# Patient Record
Sex: Male | Born: 1955 | Race: White | Hispanic: No | Smoking: Former smoker
Health system: Southern US, Community
[De-identification: ages and names within clinical notes are randomized; demographics above are authoritative.]

## PROBLEM LIST (undated history)

## (undated) DIAGNOSIS — I10 Essential (primary) hypertension: Secondary | ICD-10-CM

## (undated) DIAGNOSIS — N400 Enlarged prostate without lower urinary tract symptoms: Secondary | ICD-10-CM

## (undated) DIAGNOSIS — E119 Type 2 diabetes mellitus without complications: Secondary | ICD-10-CM

## (undated) DIAGNOSIS — E785 Hyperlipidemia, unspecified: Secondary | ICD-10-CM

## (undated) DIAGNOSIS — J449 Chronic obstructive pulmonary disease, unspecified: Secondary | ICD-10-CM

## (undated) HISTORY — PX: CYST REMOVAL TRUNK: SHX6283

## (undated) HISTORY — PX: NISSEN FUNDOPLICATION: SHX2091

## (undated) HISTORY — PX: NECK SURGERY: SHX720

## (undated) HISTORY — DX: Benign prostatic hyperplasia without lower urinary tract symptoms: N40.0

---

## 1999-01-24 HISTORY — PX: UMBILICAL HERNIA REPAIR: SHX196

## 2004-05-02 ENCOUNTER — Ambulatory Visit: Payer: Self-pay

## 2005-06-23 ENCOUNTER — Ambulatory Visit: Payer: Self-pay | Admitting: General Surgery

## 2005-07-18 ENCOUNTER — Ambulatory Visit: Payer: Self-pay | Admitting: Otolaryngology

## 2005-11-02 ENCOUNTER — Ambulatory Visit: Payer: Self-pay | Admitting: Gastroenterology

## 2005-11-08 ENCOUNTER — Ambulatory Visit: Payer: Self-pay | Admitting: Gastroenterology

## 2005-12-07 ENCOUNTER — Ambulatory Visit: Payer: Self-pay | Admitting: Gastroenterology

## 2006-02-13 ENCOUNTER — Other Ambulatory Visit: Payer: Self-pay

## 2006-02-13 ENCOUNTER — Inpatient Hospital Stay: Payer: Self-pay | Admitting: Internal Medicine

## 2006-02-21 ENCOUNTER — Ambulatory Visit: Payer: Self-pay | Admitting: Internal Medicine

## 2006-07-11 ENCOUNTER — Ambulatory Visit: Payer: Self-pay | Admitting: Specialist

## 2006-08-01 ENCOUNTER — Ambulatory Visit: Payer: Self-pay | Admitting: Gastroenterology

## 2006-08-01 ENCOUNTER — Ambulatory Visit: Payer: Self-pay | Admitting: Specialist

## 2006-08-02 ENCOUNTER — Emergency Department: Payer: Self-pay | Admitting: Emergency Medicine

## 2006-09-06 ENCOUNTER — Other Ambulatory Visit: Payer: Self-pay

## 2006-09-06 ENCOUNTER — Inpatient Hospital Stay: Payer: Self-pay | Admitting: Internal Medicine

## 2006-09-14 ENCOUNTER — Ambulatory Visit: Payer: Self-pay | Admitting: Internal Medicine

## 2006-12-13 ENCOUNTER — Ambulatory Visit: Payer: Self-pay | Admitting: Internal Medicine

## 2008-09-29 ENCOUNTER — Ambulatory Visit: Payer: Self-pay | Admitting: Internal Medicine

## 2008-10-21 ENCOUNTER — Ambulatory Visit: Payer: Self-pay | Admitting: Internal Medicine

## 2009-09-03 ENCOUNTER — Ambulatory Visit: Payer: Self-pay | Admitting: Internal Medicine

## 2010-01-14 ENCOUNTER — Ambulatory Visit: Payer: Self-pay

## 2010-07-26 ENCOUNTER — Encounter: Payer: Self-pay | Admitting: Physician Assistant

## 2010-08-23 ENCOUNTER — Ambulatory Visit: Payer: Self-pay | Admitting: Unknown Physician Specialty

## 2014-09-17 DIAGNOSIS — K635 Polyp of colon: Secondary | ICD-10-CM | POA: Insufficient documentation

## 2014-09-17 DIAGNOSIS — K219 Gastro-esophageal reflux disease without esophagitis: Secondary | ICD-10-CM | POA: Insufficient documentation

## 2014-09-17 DIAGNOSIS — E119 Type 2 diabetes mellitus without complications: Secondary | ICD-10-CM | POA: Insufficient documentation

## 2014-10-06 ENCOUNTER — Other Ambulatory Visit: Payer: Self-pay | Admitting: Nurse Practitioner

## 2014-10-06 DIAGNOSIS — R10819 Abdominal tenderness, unspecified site: Secondary | ICD-10-CM

## 2014-10-08 ENCOUNTER — Ambulatory Visit
Admission: RE | Admit: 2014-10-08 | Discharge: 2014-10-08 | Disposition: A | Discharge status: Home / Self Care | Payer: BLUE CROSS/BLUE SHIELD | Source: Ambulatory Visit | Attending: Nurse Practitioner | Admitting: Nurse Practitioner

## 2014-10-08 DIAGNOSIS — I700 Atherosclerosis of aorta: Secondary | ICD-10-CM | POA: Diagnosis not present

## 2014-10-08 DIAGNOSIS — R10819 Abdominal tenderness, unspecified site: Secondary | ICD-10-CM | POA: Diagnosis present

## 2014-10-08 DIAGNOSIS — K4090 Unilateral inguinal hernia, without obstruction or gangrene, not specified as recurrent: Secondary | ICD-10-CM | POA: Diagnosis not present

## 2014-10-08 HISTORY — DX: Type 2 diabetes mellitus without complications: E11.9

## 2014-10-08 MED ORDER — IOHEXOL 300 MG/ML  SOLN
100.0000 mL | Freq: Once | INTRAMUSCULAR | Status: AC | PRN
  Administered 2014-10-08: 100 mL via INTRAVENOUS

## 2014-10-09 LAB — POCT I-STAT CREATININE: CREATININE: 0.9 mg/dL (ref 0.61–1.24)

## 2014-10-23 ENCOUNTER — Encounter: Payer: Self-pay | Admitting: *Deleted

## 2014-10-26 ENCOUNTER — Ambulatory Visit: Payer: BLUE CROSS/BLUE SHIELD | Admitting: Anesthesiology

## 2014-10-26 ENCOUNTER — Encounter: Payer: Self-pay | Admitting: *Deleted

## 2014-10-26 ENCOUNTER — Encounter
Admission: RE | Disposition: A | Discharge status: Home / Self Care | Payer: Self-pay | Source: Ambulatory Visit | Attending: Gastroenterology

## 2014-10-26 ENCOUNTER — Ambulatory Visit
Admission: RE | Admit: 2014-10-26 | Discharge: 2014-10-26 | Disposition: A | Discharge status: Home / Self Care | Payer: BLUE CROSS/BLUE SHIELD | Source: Ambulatory Visit | Attending: Gastroenterology | Admitting: Gastroenterology

## 2014-10-26 DIAGNOSIS — Z79899 Other long term (current) drug therapy: Secondary | ICD-10-CM | POA: Diagnosis not present

## 2014-10-26 DIAGNOSIS — Z87891 Personal history of nicotine dependence: Secondary | ICD-10-CM | POA: Diagnosis not present

## 2014-10-26 DIAGNOSIS — K2950 Unspecified chronic gastritis without bleeding: Secondary | ICD-10-CM | POA: Insufficient documentation

## 2014-10-26 DIAGNOSIS — Z7984 Long term (current) use of oral hypoglycemic drugs: Secondary | ICD-10-CM | POA: Insufficient documentation

## 2014-10-26 DIAGNOSIS — J449 Chronic obstructive pulmonary disease, unspecified: Secondary | ICD-10-CM | POA: Diagnosis not present

## 2014-10-26 DIAGNOSIS — E119 Type 2 diabetes mellitus without complications: Secondary | ICD-10-CM | POA: Diagnosis not present

## 2014-10-26 DIAGNOSIS — D124 Benign neoplasm of descending colon: Secondary | ICD-10-CM | POA: Diagnosis not present

## 2014-10-26 DIAGNOSIS — R1013 Epigastric pain: Secondary | ICD-10-CM | POA: Diagnosis present

## 2014-10-26 DIAGNOSIS — Z7951 Long term (current) use of inhaled steroids: Secondary | ICD-10-CM | POA: Diagnosis not present

## 2014-10-26 DIAGNOSIS — K621 Rectal polyp: Secondary | ICD-10-CM | POA: Insufficient documentation

## 2014-10-26 DIAGNOSIS — K5730 Diverticulosis of large intestine without perforation or abscess without bleeding: Secondary | ICD-10-CM | POA: Insufficient documentation

## 2014-10-26 DIAGNOSIS — K319 Disease of stomach and duodenum, unspecified: Secondary | ICD-10-CM | POA: Insufficient documentation

## 2014-10-26 DIAGNOSIS — D125 Benign neoplasm of sigmoid colon: Secondary | ICD-10-CM | POA: Insufficient documentation

## 2014-10-26 HISTORY — PX: COLONOSCOPY WITH PROPOFOL: SHX5780

## 2014-10-26 HISTORY — DX: Chronic obstructive pulmonary disease, unspecified: J44.9

## 2014-10-26 HISTORY — PX: ESOPHAGOGASTRODUODENOSCOPY (EGD) WITH PROPOFOL: SHX5813

## 2014-10-26 LAB — GLUCOSE, CAPILLARY: Glucose-Capillary: 102 mg/dL — ABNORMAL HIGH (ref 65–99)

## 2014-10-26 SURGERY — COLONOSCOPY WITH PROPOFOL
Anesthesia: General

## 2014-10-26 MED ORDER — SODIUM CHLORIDE 0.9 % IV SOLN
INTRAVENOUS | Status: DC
  Administered 2014-10-26: 1000 mL via INTRAVENOUS

## 2014-10-26 MED ORDER — PROPOFOL 500 MG/50ML IV EMUL
INTRAVENOUS | Status: DC | PRN
  Administered 2014-10-26: 160 ug/kg/min via INTRAVENOUS

## 2014-10-26 MED ORDER — PROPOFOL 10 MG/ML IV BOLUS
INTRAVENOUS | Status: DC | PRN
  Administered 2014-10-26: 40 mg via INTRAVENOUS
  Administered 2014-10-26: 60 mg via INTRAVENOUS

## 2014-10-26 MED ORDER — GLYCOPYRROLATE 0.2 MG/ML IJ SOLN
INTRAMUSCULAR | Status: DC | PRN
  Administered 2014-10-26: 0.3 mg via INTRAVENOUS

## 2014-10-26 MED ORDER — LIDOCAINE HCL (CARDIAC) 20 MG/ML IV SOLN
INTRAVENOUS | Status: DC | PRN
  Administered 2014-10-26: 100 mg via INTRAVENOUS

## 2014-10-26 MED ORDER — FENTANYL CITRATE (PF) 100 MCG/2ML IJ SOLN
INTRAMUSCULAR | Status: DC | PRN
  Administered 2014-10-26 (×2): 25 ug via INTRAVENOUS

## 2014-10-26 MED ORDER — PHENYLEPHRINE HCL 10 MG/ML IJ SOLN
INTRAMUSCULAR | Status: DC | PRN
  Administered 2014-10-26 (×2): 100 ug via INTRAVENOUS

## 2014-10-26 MED ORDER — SODIUM CHLORIDE 0.9 % IV SOLN: INTRAVENOUS | Status: DC

## 2014-10-26 NOTE — Anesthesia Postprocedure Evaluation (Signed)
  Anesthesia Post-op Note  Patient: Ricardo Durham  Procedure(s) Performed: Procedure(s): COLONOSCOPY WITH PROPOFOL (N/A) ESOPHAGOGASTRODUODENOSCOPY (EGD) WITH PROPOFOL (N/A)  Anesthesia type:General  Patient location: PACU  Post pain: Pain level controlled  Post assessment: Post-op Vital signs reviewed, Patient's Cardiovascular Status Stable, Respiratory Function Stable, Patent Airway and No signs of Nausea or vomiting  Post vital signs: Reviewed and stable  Last Vitals:  Filed Vitals:   10/26/14 1510  BP: 92/50  Pulse: 71  Temp:   Resp: 15    Level of consciousness: awake, alert  and patient cooperative  Complications: No apparent anesthesia complications

## 2014-10-26 NOTE — Anesthesia Preprocedure Evaluation (Signed)
Anesthesia Evaluation  Patient identified by MRN, date of birth, ID band Patient awake    Reviewed: Allergy & Precautions, H&P , NPO status , Patient's Chart, lab work & pertinent test results, reviewed documented beta blocker date and time   Airway Mallampati: II  TM Distance: >3 FB Neck ROM: full    Dental no notable dental hx.    Pulmonary neg pulmonary ROS, COPD, former smoker,    Pulmonary exam normal breath sounds clear to auscultation       Cardiovascular Exercise Tolerance: Good negative cardio ROS   Rhythm:regular Rate:Normal     Neuro/Psych negative neurological ROS  negative psych ROS   GI/Hepatic negative GI ROS, Neg liver ROS,   Endo/Other  negative endocrine ROSdiabetes  Renal/GU negative Renal ROS  negative genitourinary   Musculoskeletal   Abdominal   Peds  Hematology negative hematology ROS (+)   Anesthesia Other Findings   Reproductive/Obstetrics negative OB ROS                             Anesthesia Physical Anesthesia Plan  ASA: III  Anesthesia Plan: General   Post-op Pain Management:    Induction:   Airway Management Planned:   Additional Equipment:   Intra-op Plan:   Post-operative Plan:   Informed Consent: I have reviewed the patients History and Physical, chart, labs and discussed the procedure including the risks, benefits and alternatives for the proposed anesthesia with the patient or authorized representative who has indicated his/her understanding and acceptance.   Dental Advisory Given  Plan Discussed with: CRNA  Anesthesia Plan Comments:         Anesthesia Quick Evaluation

## 2014-10-26 NOTE — Transfer of Care (Signed)
Immediate Anesthesia Transfer of Care Note  Patient: Ricardo Durham  Procedure(s) Performed: Procedure(s): COLONOSCOPY WITH PROPOFOL (N/A) ESOPHAGOGASTRODUODENOSCOPY (EGD) WITH PROPOFOL (N/A)  Patient Location: PACU  Anesthesia Type:General  Level of Consciousness: awake  Airway & Oxygen Therapy: Patient Spontanous Breathing and Patient connected to nasal cannula oxygen  Post-op Assessment: Report given to RN and Post -op Vital signs reviewed and stable  Post vital signs: Reviewed and stable  Last Vitals:  Filed Vitals:   10/26/14 1504  BP: 90/53  Pulse: 72  Temp: 36.4 C  Resp: 12    Complications: No apparent anesthesia complications

## 2014-10-26 NOTE — H&P (Signed)
Outpatient short stay form Pre-procedure 10/26/2014 2:06 PM Ricardo Sails MD  Primary Physician: Dr. Clayborn Bigness  Reason for visit:  EGD and colonoscopy  History of present illness:  She is a 59 year old male presenting today with complaint of epigastric pain some lower abdominal discomfort after taking coffee. He was taking a proton pump inhibitor intermittently this has been increased in frequency as of last week he also has a personal history of colon polyps last colonoscopy being done in 2005.  She tolerated his prep well. He does not take any aspirin products or anticoagulation medications however he does take diclofenac 75 mg once to twice a day.    Current facility-administered medications:  .  0.9 %  sodium chloride infusion, , Intravenous, Continuous, Ricardo Sails, MD, Last Rate: 20 mL/hr at 10/26/14 1348, 1,000 mL at 10/26/14 1348 .  0.9 %  sodium chloride infusion, , Intravenous, Continuous, Ricardo Sails, MD  Facility-Administered Medications Ordered in Other Encounters:  .  glycopyrrolate (ROBINUL) injection, , Intravenous, Anesthesia Intra-op, Aline Brochure, CRNA, 0.3 mg at 10/26/14 1358  Prescriptions prior to admission  Medication Sig Dispense Refill Last Dose  . Glucose Blood (ACCU-CHEK AVIVA PLUS VI) by In Vitro route.   10/26/2014 at Unknown time  . glyBURIDE (DIABETA) 5 MG tablet Take 5 mg by mouth daily with breakfast.   10/23/2014 at Unknown time  . omeprazole (PRILOSEC) 40 MG capsule Take 40 mg by mouth daily.   10/23/2014  . polyethylene glycol-electrolytes (NULYTELY/GOLYTELY) 420 G solution Take 4,000 mLs by mouth once.   10/25/2014 at Unknown time  . sitaGLIPtin-metformin (JANUMET) 50-1000 MG tablet Take 1 tablet by mouth 2 (two) times daily with a meal.   10/23/2014  . azithromycin (ZITHROMAX) 250 MG tablet Take by mouth daily.   Not Taking at Unknown time  . diclofenac (VOLTAREN) 75 MG EC tablet Take 75 mg by mouth 2 (two) times daily.   Not Taking at  Unknown time  . fluticasone (FLONASE) 50 MCG/ACT nasal spray Place 2 sprays into both nostrils daily.   Not Taking at Unknown time     No Known Allergies   Past Medical History  Diagnosis Date  . Diabetes mellitus without complication ([place])   . COPD (chronic obstructive pulmonary disease) ([facility])     Review of systems:      Physical Exam    Heart and lungs: Regular rate and rhythm without rub or gallop, lungs are bilaterally clear   HEENT: Normocephalic atraumatic eyes are anicteric    Other:     Pertinant exam for procedure: Soft nontender nondistended bowel sounds positive normoactive. There is some mild discomfort right upper quadrant to palpation. There are no masses or rebound. Bowel sounds are positive normoactive.    Planned proceedures: EGD and colonoscopy. I have discussed the risks benefits and complications of procedures to include not limited to bleeding, infection, perforation and the risk of sedation and the patient wishes to proceed.    Ricardo Sails, MD Gastroenterology 10/26/2014  2:06 PM

## 2014-10-26 NOTE — Op Note (Signed)
[facility] Gastroenterology Patient Name: Ricardo Durham Procedure Date: 10/26/2014 1:57 PM MRN: [id number] Account #: [id number] Date of Birth: 09-13-1955 Admit Type: Outpatient Age: 59 Room: [facility] ENDO ROOM 2 Gender: Male Note Status: Finalized Procedure:         Colonoscopy Indications:       Personal history of colonic polyps Providers:         Lollie Sails, MD Referring MD:      Lavera Guise, MD (Referring MD) Medicines:         Monitored Anesthesia Care Complications:     No immediate complications. Procedure:         Pre-Anesthesia Assessment:                    - ASA Grade Assessment: III - A patient with severe                     systemic disease.                    After obtaining informed consent, the colonoscope was                     passed under direct vision. Throughout the procedure, the                     patient's blood pressure, pulse, and oxygen saturations                     were monitored continuously. The Olympus PCF-H180AL                     colonoscope ( S#: [id number] ) was introduced through the                     anus and advanced to the the cecum, identified by                     appendiceal orifice and ileocecal valve. The colonoscopy                     was performed without difficulty. The patient tolerated                     the procedure well. The quality of the bowel preparation                     was good. Findings:      A 2 mm polyp was found in the distal sigmoid colon. The polyp was       sessile. The polyp was removed with a cold biopsy forceps. Resection and       retrieval were complete.      A 2 mm polyp was found in the descending colon. The polyp was sessile.       The polyp was removed with a cold biopsy forceps. Resection and       retrieval were complete.      A 2 mm polyp was found in the sigmoid colon. The polyp was sessile. The       polyp was removed with a cold biopsy forceps. Resection and  retrieval       were complete.      A 1 mm polyp was found in the rectum. The polyp was sessile. The polyp  was removed with a cold biopsy forceps. Resection and retrieval were       complete.      Multiple small-mouthed diverticula were found in the sigmoid colon and       in the descending colon.      The retroflexed view of the distal rectum and anal verge was normal and       showed no anal or rectal abnormalities.      The digital rectal exam was normal. Impression:        - One 2 mm polyp in the distal sigmoid colon. Resected and                     retrieved.                    - One 2 mm polyp in the descending colon. Resected and                     retrieved.                    - One 2 mm polyp in the sigmoid colon. Resected and                     retrieved.                    - One 1 mm polyp in the rectum. Resected and retrieved.                    - Diverticulosis in the sigmoid colon and in the                     descending colon.                    - The distal rectum and anal verge are normal on                     retroflexion view. Recommendation:    - Discharge patient to home. Procedure Code(s): --- Professional ---                    [id number] [id number], Colonoscopy, flexible; with biopsy, single or                     multiple Diagnosis Code(s): --- Professional ---                    211.3, Benign neoplasm of colon                    569.0, Anal and rectal polyp                    V12.72, Personal history of colonic polyps                    562.10, Diverticulosis of colon (without mention of                     hemorrhage) CPT copyright 2014 American Medical Association. All rights reserved. The codes documented in this report are preliminary and upon coder review may  be revised to meet current compliance requirements. Lollie Sails, MD 10/26/2014 3:00:07 PM This report has been signed electronically. Number of Addenda: 0 Note Initiated On: 10/26/2014 1:57  PM Scope Withdrawal  Time: 0 hours 15 minutes 19 seconds  Total Procedure Duration: 0 hours 25 minutes 8 seconds       [facility]

## 2014-10-26 NOTE — Op Note (Signed)
[facility] Gastroenterology Patient Name: Ricardo Durham Procedure Date: 10/26/2014 1:58 PM MRN: [id number] Account #: [id number] Date of Birth: March 12, 1955 Admit Type: Outpatient Age: 59 Room: [facility] ENDO ROOM 2 Gender: Male Note Status: Finalized Procedure:         Upper GI endoscopy Indications:       Epigastric abdominal pain Providers:         Lollie Sails, MD Referring MD:      Lavera Guise, MD (Referring MD) Medicines:         Monitored Anesthesia Care Complications:     No immediate complications. Procedure:         Pre-Anesthesia Assessment:                    - ASA Grade Assessment: III - A patient with severe                     systemic disease.                    After obtaining informed consent, the endoscope was passed                     under direct vision. Throughout the procedure, the                     patient's blood pressure, pulse, and oxygen saturations                     were monitored continuously. The Olympus GIF-160 endoscope                     (S#. [id number]) was introduced through the mouth, and                     advanced to the third part of duodenum. The upper GI                     endoscopy was accomplished without difficulty. The patient                     tolerated the procedure well. Findings:      The Z-line was variable. Biopsies were taken with a cold forceps for       histology.      The exam of the esophagus was otherwise normal.      Localized minimal inflammation characterized by congestion (edema) was       found in the prepyloric region of the stomach. Biopsies were taken with       a cold forceps for Helicobacter pylori testing.      The cardia and gastric fundus were normal on retroflexion.      The examined duodenum was normal. Impression:        - Z-line variable. Biopsied.                    - Gastritis. Biopsied.                    - Normal examined duodenum. Recommendation:    - Use Protonix  (pantoprazole) 40 mg PO daily daily.                    - Await pathology results. Procedure Code(s): --- Professional ---  81594, Esophagogastroduodenoscopy, flexible, transoral;                     with biopsy, single or multiple Diagnosis Code(s): --- Professional ---                    530.89, Other specified disorders of esophagus                    535.50, Unspecified gastritis and gastroduodenitis,                     without mention of hemorrhage                    789.06, Abdominal pain, epigastric CPT copyright 2014 American Medical Association. All rights reserved. The codes documented in this report are preliminary and upon coder review may  be revised to meet current compliance requirements. Lollie Sails, MD 10/26/2014 2:28:28 PM This report has been signed electronically. Number of Addenda: 0 Note Initiated On: 10/26/2014 1:58 PM      [facility]

## 2014-10-27 ENCOUNTER — Encounter: Payer: Self-pay | Admitting: Gastroenterology

## 2014-10-28 LAB — SURGICAL PATHOLOGY

## 2014-10-30 ENCOUNTER — Encounter: Payer: Self-pay | Admitting: General Surgery

## 2014-11-10 ENCOUNTER — Ambulatory Visit (INDEPENDENT_AMBULATORY_CARE_PROVIDER_SITE_OTHER): Payer: BLUE CROSS/BLUE SHIELD | Admitting: General Surgery

## 2014-11-10 ENCOUNTER — Encounter: Payer: Self-pay | Admitting: General Surgery

## 2014-11-10 VITALS — BP 120/70 | HR 76 | Resp 12 | Ht 71.0 in | Wt 222.0 lb

## 2014-11-10 DIAGNOSIS — K4090 Unilateral inguinal hernia, without obstruction or gangrene, not specified as recurrent: Secondary | ICD-10-CM | POA: Diagnosis not present

## 2014-11-10 NOTE — Patient Instructions (Addendum)

## 2014-11-10 NOTE — Progress Notes (Signed)
Patient ID: Ricardo Durham, male   DOB: [birth year], 59 y.o.   MRN: [id number]  Chief Complaint  Patient presents with  . Other    inguinal hernia    HPI Ricardo Durham is a 59 y.o. male here today for a evaluation of a left inguinal hernia. He states he noticed this about two years ago. 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 Patient had a ultrasound done on 09/23/14 and ct scan performed on 10/08/14.   HPI  Past Medical History  Diagnosis Date  . Diabetes mellitus without complication ([place])   . COPD (chronic obstructive pulmonary disease) [facility])     Past Surgical History  Procedure Laterality Date  . Neck surgery      c 6-7 due to stable fracture  . Nissen fundoplication    . Umbilical hernia repair  2001  . Colonoscopy with propofol N/A 10/26/2014    Procedure: COLONOSCOPY WITH PROPOFOL;  Surgeon: Lollie Sails, MD;  Location: [facility] ENDOSCOPY;  Service: Endoscopy;  Laterality: N/A;  . Esophagogastroduodenoscopy (egd) with propofol N/A 10/26/2014    Procedure: ESOPHAGOGASTRODUODENOSCOPY (EGD) WITH PROPOFOL;  Surgeon: Lollie Sails, MD;  Location: [facility] ENDOSCOPY;  Service: Endoscopy;  Laterality: N/A;    History reviewed. No pertinent family history.  Social History Social History  Substance Use Topics  . Smoking status: Former Smoker    Types: Cigarettes  . Smokeless tobacco: Never Used  . Alcohol Use: No    No Known Allergies  Current Outpatient Prescriptions  Medication Sig Dispense Refill  . azithromycin (ZITHROMAX) 250 MG tablet Take by mouth daily.    . diclofenac (VOLTAREN) 75 MG EC tablet Take 75 mg by mouth 2 (two) times daily.    . fluticasone (FLONASE) 50 MCG/ACT nasal spray Place 2 sprays into both nostrils daily.    . Glucose Blood (ACCU-CHEK AVIVA PLUS VI) by In Vitro route.    Marland Kitchen glyBURIDE (DIABETA) 5 MG tablet Take 5 mg by mouth daily with breakfast.    . omeprazole (PRILOSEC) 40 MG capsule Take 40 mg by mouth daily.    .  polyethylene glycol-electrolytes (NULYTELY/GOLYTELY) 420 G solution Take 4,000 mLs by mouth once.    . sitaGLIPtin-metformin (JANUMET) 50-1000 MG tablet Take 1 tablet by mouth 2 (two) times daily with a meal.     No current facility-administered medications for this visit.    Review of Systems Review of Systems  Constitutional: Negative.   Respiratory: Negative.   Cardiovascular: Negative.     Blood pressure 120/70, pulse 76, resp. rate 12, height 5\' 11"  (1.803 m), weight 222 lb (100.699 kg).  Physical Exam Physical Exam  Constitutional: He is oriented to person, place, and time. He appears well-developed and well-nourished.  Eyes: Conjunctivae are normal. No scleral icterus.  Neck: Neck supple.  Cardiovascular: Normal rate, regular rhythm and normal heart sounds.   Pulmonary/Chest: Effort normal and breath sounds normal.  Abdominal: Soft. Normal appearance and bowel sounds are normal. There is no hepatomegaly. There is no tenderness. A hernia is present. Hernia confirmed positive in the left inguinal area ( small left inguinal hernia).  Lymphadenopathy:    He has no cervical adenopathy.  Neurological: He is alert and oriented to person, place, and time.  Skin: Skin is warm.    Data Reviewed   Assessment    Small inguinal hernia     Plan     Hernia precautions and incarceration were discussed with  the patient. If they develop symptoms of an incarcerated hernia, they were encouraged to seek prompt medical attention.  I have recommended repair of the hernia using mesh on an outpatient basis in the near future. The risk of infection was reviewed. The role of prosthetic mesh to minimize the risk of recurrence was reviewed.    Patient will call to schedule his surgery, he had been given surgery paperwork.   PCP:  Leroy Sea G 11/11/2014, 7:41 AM

## 2014-11-11 ENCOUNTER — Encounter: Payer: Self-pay | Admitting: General Surgery

## 2014-11-12 ENCOUNTER — Other Ambulatory Visit: Payer: Self-pay | Admitting: Neurosurgery

## 2014-11-12 DIAGNOSIS — M542 Cervicalgia: Secondary | ICD-10-CM

## 2014-11-19 ENCOUNTER — Ambulatory Visit
Admission: RE | Admit: 2014-11-19 | Discharge: 2014-11-19 | Disposition: A | Discharge status: Home / Self Care | Payer: BLUE CROSS/BLUE SHIELD | Source: Ambulatory Visit | Attending: Neurosurgery | Admitting: Neurosurgery

## 2014-11-19 ENCOUNTER — Other Ambulatory Visit: Payer: Self-pay

## 2014-11-19 DIAGNOSIS — M542 Cervicalgia: Secondary | ICD-10-CM

## 2014-11-19 MED ORDER — IOHEXOL 300 MG/ML  SOLN
10.0000 mL | Freq: Once | INTRAMUSCULAR | Status: DC | PRN
  Administered 2014-11-19: 10 mL via INTRATHECAL

## 2014-11-19 MED ORDER — DIAZEPAM 5 MG PO TABS
10.0000 mg | ORAL_TABLET | Freq: Once | ORAL | Status: AC
  Administered 2014-11-19: 10 mg via ORAL

## 2014-11-19 NOTE — Discharge Instructions (Signed)

## 2014-12-03 DIAGNOSIS — K4020 Bilateral inguinal hernia, without obstruction or gangrene, not specified as recurrent: Secondary | ICD-10-CM | POA: Insufficient documentation

## 2015-03-30 ENCOUNTER — Ambulatory Visit
Admission: RE | Admit: 2015-03-30 | Discharge: 2015-03-30 | Disposition: A | Discharge status: Home / Self Care | Payer: BLUE CROSS/BLUE SHIELD | Source: Ambulatory Visit | Attending: Nurse Practitioner | Admitting: Nurse Practitioner

## 2015-03-30 ENCOUNTER — Other Ambulatory Visit: Payer: Self-pay | Admitting: Nurse Practitioner

## 2015-03-30 DIAGNOSIS — M13851 Other specified arthritis, right hip: Secondary | ICD-10-CM | POA: Insufficient documentation

## 2015-03-30 DIAGNOSIS — M25552 Pain in left hip: Secondary | ICD-10-CM | POA: Insufficient documentation

## 2015-03-30 DIAGNOSIS — M25559 Pain in unspecified hip: Secondary | ICD-10-CM

## 2015-03-30 DIAGNOSIS — M25551 Pain in right hip: Secondary | ICD-10-CM | POA: Diagnosis not present

## 2015-06-24 DIAGNOSIS — R079 Chest pain, unspecified: Secondary | ICD-10-CM | POA: Diagnosis not present

## 2015-06-24 DIAGNOSIS — E1165 Type 2 diabetes mellitus with hyperglycemia: Secondary | ICD-10-CM | POA: Diagnosis not present

## 2015-06-24 DIAGNOSIS — I25118 Atherosclerotic heart disease of native coronary artery with other forms of angina pectoris: Secondary | ICD-10-CM | POA: Diagnosis not present

## 2015-06-25 ENCOUNTER — Other Ambulatory Visit: Payer: Self-pay | Admitting: Physician Assistant

## 2015-06-25 DIAGNOSIS — K219 Gastro-esophageal reflux disease without esophagitis: Principal | ICD-10-CM

## 2015-06-25 DIAGNOSIS — IMO0001 Reserved for inherently not codable concepts without codable children: Secondary | ICD-10-CM

## 2015-06-28 DIAGNOSIS — R079 Chest pain, unspecified: Secondary | ICD-10-CM | POA: Diagnosis not present

## 2015-07-02 ENCOUNTER — Ambulatory Visit
Admission: RE | Admit: 2015-07-02 | Discharge: 2015-07-02 | Disposition: A | Discharge status: Home / Self Care | Payer: BLUE CROSS/BLUE SHIELD | Source: Ambulatory Visit | Attending: Physician Assistant | Admitting: Physician Assistant

## 2015-07-02 DIAGNOSIS — K219 Gastro-esophageal reflux disease without esophagitis: Principal | ICD-10-CM

## 2015-07-02 DIAGNOSIS — K449 Diaphragmatic hernia without obstruction or gangrene: Secondary | ICD-10-CM | POA: Insufficient documentation

## 2015-07-02 DIAGNOSIS — R1030 Lower abdominal pain, unspecified: Secondary | ICD-10-CM | POA: Diagnosis not present

## 2015-07-02 DIAGNOSIS — IMO0001 Reserved for inherently not codable concepts without codable children: Secondary | ICD-10-CM

## 2015-07-14 DIAGNOSIS — R079 Chest pain, unspecified: Secondary | ICD-10-CM | POA: Diagnosis not present

## 2015-07-14 DIAGNOSIS — R0602 Shortness of breath: Secondary | ICD-10-CM | POA: Diagnosis not present

## 2015-07-16 DIAGNOSIS — H60391 Other infective otitis externa, right ear: Secondary | ICD-10-CM | POA: Diagnosis not present

## 2015-07-24 DIAGNOSIS — H60391 Other infective otitis externa, right ear: Secondary | ICD-10-CM | POA: Diagnosis not present

## 2015-07-29 DIAGNOSIS — I25118 Atherosclerotic heart disease of native coronary artery with other forms of angina pectoris: Secondary | ICD-10-CM | POA: Diagnosis not present

## 2015-08-09 DIAGNOSIS — Z01818 Encounter for other preprocedural examination: Secondary | ICD-10-CM | POA: Diagnosis not present

## 2015-08-09 DIAGNOSIS — J019 Acute sinusitis, unspecified: Secondary | ICD-10-CM | POA: Diagnosis not present

## 2015-08-09 DIAGNOSIS — R931 Abnormal findings on diagnostic imaging of heart and coronary circulation: Secondary | ICD-10-CM | POA: Diagnosis not present

## 2015-08-09 DIAGNOSIS — J208 Acute bronchitis due to other specified organisms: Secondary | ICD-10-CM | POA: Diagnosis not present

## 2015-08-09 DIAGNOSIS — B9689 Other specified bacterial agents as the cause of diseases classified elsewhere: Secondary | ICD-10-CM | POA: Diagnosis not present

## 2015-08-09 DIAGNOSIS — E782 Mixed hyperlipidemia: Secondary | ICD-10-CM | POA: Diagnosis not present

## 2015-08-09 DIAGNOSIS — I208 Other forms of angina pectoris: Secondary | ICD-10-CM | POA: Diagnosis not present

## 2015-08-10 DIAGNOSIS — I208 Other forms of angina pectoris: Secondary | ICD-10-CM | POA: Diagnosis present

## 2015-08-24 ENCOUNTER — Encounter
Admission: RE | Disposition: A | Discharge status: Home / Self Care | Payer: Self-pay | Source: Ambulatory Visit | Attending: Internal Medicine

## 2015-08-24 ENCOUNTER — Ambulatory Visit
Admission: RE | Admit: 2015-08-24 | Discharge: 2015-08-24 | Disposition: A | Discharge status: Home / Self Care | Payer: BLUE CROSS/BLUE SHIELD | Source: Ambulatory Visit | Attending: Internal Medicine | Admitting: Internal Medicine

## 2015-08-24 ENCOUNTER — Encounter: Payer: Self-pay | Admitting: *Deleted

## 2015-08-24 DIAGNOSIS — Z885 Allergy status to narcotic agent status: Secondary | ICD-10-CM | POA: Diagnosis not present

## 2015-08-24 DIAGNOSIS — Z79899 Other long term (current) drug therapy: Secondary | ICD-10-CM | POA: Insufficient documentation

## 2015-08-24 DIAGNOSIS — I2089 Other forms of angina pectoris: Secondary | ICD-10-CM | POA: Diagnosis present

## 2015-08-24 DIAGNOSIS — Z823 Family history of stroke: Secondary | ICD-10-CM | POA: Diagnosis not present

## 2015-08-24 DIAGNOSIS — I208 Other forms of angina pectoris: Secondary | ICD-10-CM | POA: Diagnosis present

## 2015-08-24 DIAGNOSIS — Z8371 Family history of colonic polyps: Secondary | ICD-10-CM | POA: Insufficient documentation

## 2015-08-24 DIAGNOSIS — I25119 Atherosclerotic heart disease of native coronary artery with unspecified angina pectoris: Secondary | ICD-10-CM | POA: Diagnosis not present

## 2015-08-24 DIAGNOSIS — E785 Hyperlipidemia, unspecified: Secondary | ICD-10-CM | POA: Diagnosis not present

## 2015-08-24 DIAGNOSIS — E119 Type 2 diabetes mellitus without complications: Secondary | ICD-10-CM | POA: Diagnosis not present

## 2015-08-24 DIAGNOSIS — Z7984 Long term (current) use of oral hypoglycemic drugs: Secondary | ICD-10-CM | POA: Insufficient documentation

## 2015-08-24 DIAGNOSIS — Z833 Family history of diabetes mellitus: Secondary | ICD-10-CM | POA: Diagnosis not present

## 2015-08-24 DIAGNOSIS — K219 Gastro-esophageal reflux disease without esophagitis: Secondary | ICD-10-CM | POA: Insufficient documentation

## 2015-08-24 DIAGNOSIS — Z87891 Personal history of nicotine dependence: Secondary | ICD-10-CM | POA: Diagnosis not present

## 2015-08-24 DIAGNOSIS — Z86010 Personal history of colonic polyps: Secondary | ICD-10-CM | POA: Diagnosis not present

## 2015-08-24 DIAGNOSIS — I25110 Atherosclerotic heart disease of native coronary artery with unstable angina pectoris: Secondary | ICD-10-CM | POA: Diagnosis not present

## 2015-08-24 DIAGNOSIS — E7800 Pure hypercholesterolemia, unspecified: Secondary | ICD-10-CM | POA: Insufficient documentation

## 2015-08-24 DIAGNOSIS — R079 Chest pain, unspecified: Secondary | ICD-10-CM | POA: Diagnosis present

## 2015-08-24 HISTORY — PX: CARDIAC CATHETERIZATION: SHX172

## 2015-08-24 SURGERY — LEFT HEART CATH AND CORONARY ANGIOGRAPHY
Anesthesia: Moderate Sedation | Laterality: Left

## 2015-08-24 MED ORDER — MIDAZOLAM HCL 2 MG/2ML IJ SOLN
INTRAMUSCULAR | Status: AC
  Filled 2015-08-24: qty 2

## 2015-08-24 MED ORDER — SODIUM CHLORIDE 0.9 % IV SOLN
250.0000 mL | INTRAVENOUS | Status: DC | PRN
  Administered 2015-08-24: 250 mL via INTRAVENOUS

## 2015-08-24 MED ORDER — FENTANYL CITRATE (PF) 100 MCG/2ML IJ SOLN
INTRAMUSCULAR | Status: DC | PRN
  Administered 2015-08-24: 50 ug via INTRAVENOUS

## 2015-08-24 MED ORDER — FENTANYL CITRATE (PF) 100 MCG/2ML IJ SOLN
INTRAMUSCULAR | Status: AC
  Filled 2015-08-24: qty 2

## 2015-08-24 MED ORDER — HEPARIN (PORCINE) IN NACL 2-0.9 UNIT/ML-% IJ SOLN
INTRAMUSCULAR | Status: AC
  Filled 2015-08-24: qty 1000

## 2015-08-24 MED ORDER — MIDAZOLAM HCL 2 MG/2ML IJ SOLN
INTRAMUSCULAR | Status: DC | PRN
  Administered 2015-08-24: 1 mg via INTRAVENOUS

## 2015-08-24 MED ORDER — IOPAMIDOL (ISOVUE-300) INJECTION 61%
INTRAVENOUS | Status: DC | PRN
  Administered 2015-08-24: 130 mL

## 2015-08-24 MED ORDER — ATORVASTATIN CALCIUM 80 MG PO TABS: 80.0000 mg | ORAL_TABLET | Freq: Every day | ORAL | 4 refills | Status: DC

## 2015-08-24 MED ORDER — ONDANSETRON HCL 4 MG/2ML IJ SOLN: 4.0000 mg | Freq: Four times a day (QID) | INTRAMUSCULAR | Status: DC | PRN

## 2015-08-24 MED ORDER — ACETAMINOPHEN 325 MG PO TABS: 650.0000 mg | ORAL_TABLET | ORAL | Status: DC | PRN

## 2015-08-24 SURGICAL SUPPLY — 10 items
CATH INFINITI 5FR ANG PIGTAIL (CATHETERS) ×2 IMPLANT
CATH INFINITI 5FR JL4 (CATHETERS) ×2 IMPLANT
CATH INFINITI JR4 5F (CATHETERS) ×2 IMPLANT
DEVICE CLOSURE MYNXGRIP 5F (Vascular Products) ×2 IMPLANT
KIT MANI 3VAL PERCEP (MISCELLANEOUS) ×2 IMPLANT
NEEDLE PERC 18GX7CM (NEEDLE) ×2 IMPLANT
PACK CARDIAC CATH (CUSTOM PROCEDURE TRAY) ×2 IMPLANT
SHEATH AVANTI 6FR X 11CM (SHEATH) ×2 IMPLANT
SHEATH PINNACLE 5F 10CM (SHEATH) ×2 IMPLANT
WIRE EMERALD 3MM-J .035X150CM (WIRE) ×2 IMPLANT

## 2015-08-24 NOTE — Progress Notes (Signed)
Patient tolerated procedure well. MD has been in to see family. All questions and concerns were addressed. This RN has gone over discharge instructions and new RX information. Family and patient verbalized their understanding. Right groin dressing is dry and intact. No bruising or hematoma noticed. Patient denies pain. Patient is tolerating PO without complication.

## 2015-08-31 DIAGNOSIS — K219 Gastro-esophageal reflux disease without esophagitis: Secondary | ICD-10-CM | POA: Diagnosis not present

## 2015-08-31 DIAGNOSIS — R931 Abnormal findings on diagnostic imaging of heart and coronary circulation: Secondary | ICD-10-CM | POA: Diagnosis not present

## 2015-08-31 DIAGNOSIS — I208 Other forms of angina pectoris: Secondary | ICD-10-CM | POA: Diagnosis not present

## 2015-08-31 DIAGNOSIS — E119 Type 2 diabetes mellitus without complications: Secondary | ICD-10-CM | POA: Diagnosis not present

## 2015-09-28 DIAGNOSIS — I10 Essential (primary) hypertension: Secondary | ICD-10-CM | POA: Diagnosis not present

## 2015-09-28 DIAGNOSIS — I2510 Atherosclerotic heart disease of native coronary artery without angina pectoris: Secondary | ICD-10-CM | POA: Diagnosis not present

## 2015-09-28 DIAGNOSIS — E782 Mixed hyperlipidemia: Secondary | ICD-10-CM | POA: Diagnosis not present

## 2015-09-29 DIAGNOSIS — E782 Mixed hyperlipidemia: Secondary | ICD-10-CM | POA: Diagnosis not present

## 2015-09-29 DIAGNOSIS — Z0001 Encounter for general adult medical examination with abnormal findings: Secondary | ICD-10-CM | POA: Diagnosis not present

## 2015-09-29 DIAGNOSIS — Z125 Encounter for screening for malignant neoplasm of prostate: Secondary | ICD-10-CM | POA: Diagnosis not present

## 2015-09-29 DIAGNOSIS — I25118 Atherosclerotic heart disease of native coronary artery with other forms of angina pectoris: Secondary | ICD-10-CM | POA: Diagnosis not present

## 2015-09-29 DIAGNOSIS — K219 Gastro-esophageal reflux disease without esophagitis: Secondary | ICD-10-CM | POA: Diagnosis not present

## 2015-09-29 DIAGNOSIS — E1165 Type 2 diabetes mellitus with hyperglycemia: Secondary | ICD-10-CM | POA: Diagnosis not present

## 2015-10-05 DIAGNOSIS — R5381 Other malaise: Secondary | ICD-10-CM | POA: Diagnosis not present

## 2015-11-11 DIAGNOSIS — H9313 Tinnitus, bilateral: Secondary | ICD-10-CM | POA: Diagnosis not present

## 2015-11-11 DIAGNOSIS — H903 Sensorineural hearing loss, bilateral: Secondary | ICD-10-CM | POA: Diagnosis not present

## 2015-11-24 DIAGNOSIS — E1165 Type 2 diabetes mellitus with hyperglycemia: Secondary | ICD-10-CM | POA: Diagnosis not present

## 2015-11-24 DIAGNOSIS — K429 Umbilical hernia without obstruction or gangrene: Secondary | ICD-10-CM | POA: Diagnosis not present

## 2015-11-24 DIAGNOSIS — K219 Gastro-esophageal reflux disease without esophagitis: Secondary | ICD-10-CM | POA: Diagnosis not present

## 2015-11-29 ENCOUNTER — Encounter: Payer: Self-pay | Admitting: *Deleted

## 2015-12-01 ENCOUNTER — Ambulatory Visit: Payer: BLUE CROSS/BLUE SHIELD | Admitting: General Surgery

## 2015-12-03 DIAGNOSIS — H2513 Age-related nuclear cataract, bilateral: Secondary | ICD-10-CM | POA: Diagnosis not present

## 2015-12-07 DIAGNOSIS — E1165 Type 2 diabetes mellitus with hyperglycemia: Secondary | ICD-10-CM | POA: Diagnosis not present

## 2015-12-07 DIAGNOSIS — R1030 Lower abdominal pain, unspecified: Secondary | ICD-10-CM | POA: Insufficient documentation

## 2015-12-07 DIAGNOSIS — Z9889 Other specified postprocedural states: Secondary | ICD-10-CM | POA: Diagnosis not present

## 2015-12-07 DIAGNOSIS — Z8719 Personal history of other diseases of the digestive system: Secondary | ICD-10-CM | POA: Diagnosis not present

## 2015-12-07 DIAGNOSIS — K449 Diaphragmatic hernia without obstruction or gangrene: Secondary | ICD-10-CM | POA: Diagnosis not present

## 2015-12-07 DIAGNOSIS — M6208 Separation of muscle (nontraumatic), other site: Secondary | ICD-10-CM | POA: Insufficient documentation

## 2015-12-07 DIAGNOSIS — Z87891 Personal history of nicotine dependence: Secondary | ICD-10-CM | POA: Diagnosis not present

## 2015-12-07 DIAGNOSIS — Z79899 Other long term (current) drug therapy: Secondary | ICD-10-CM | POA: Diagnosis not present

## 2015-12-07 DIAGNOSIS — K2970 Gastritis, unspecified, without bleeding: Secondary | ICD-10-CM | POA: Diagnosis not present

## 2015-12-07 DIAGNOSIS — K219 Gastro-esophageal reflux disease without esophagitis: Secondary | ICD-10-CM | POA: Diagnosis not present

## 2015-12-14 DIAGNOSIS — R1031 Right lower quadrant pain: Secondary | ICD-10-CM | POA: Diagnosis not present

## 2015-12-27 DIAGNOSIS — E1165 Type 2 diabetes mellitus with hyperglycemia: Secondary | ICD-10-CM | POA: Diagnosis not present

## 2015-12-27 DIAGNOSIS — K429 Umbilical hernia without obstruction or gangrene: Secondary | ICD-10-CM | POA: Diagnosis not present

## 2015-12-27 DIAGNOSIS — I10 Essential (primary) hypertension: Secondary | ICD-10-CM | POA: Diagnosis not present

## 2015-12-27 DIAGNOSIS — K219 Gastro-esophageal reflux disease without esophagitis: Secondary | ICD-10-CM | POA: Diagnosis not present

## 2016-01-11 DIAGNOSIS — H6062 Unspecified chronic otitis externa, left ear: Secondary | ICD-10-CM | POA: Diagnosis not present

## 2016-01-11 DIAGNOSIS — H60542 Acute eczematoid otitis externa, left ear: Secondary | ICD-10-CM | POA: Diagnosis not present

## 2016-01-12 DIAGNOSIS — R509 Fever, unspecified: Secondary | ICD-10-CM | POA: Diagnosis not present

## 2016-01-12 DIAGNOSIS — J029 Acute pharyngitis, unspecified: Secondary | ICD-10-CM | POA: Diagnosis not present

## 2016-01-12 DIAGNOSIS — R0981 Nasal congestion: Secondary | ICD-10-CM | POA: Diagnosis not present

## 2016-01-20 ENCOUNTER — Encounter: Payer: Self-pay | Admitting: *Deleted

## 2016-02-02 DIAGNOSIS — E782 Mixed hyperlipidemia: Secondary | ICD-10-CM | POA: Diagnosis not present

## 2016-02-02 DIAGNOSIS — I10 Essential (primary) hypertension: Secondary | ICD-10-CM | POA: Diagnosis not present

## 2016-02-02 DIAGNOSIS — E1159 Type 2 diabetes mellitus with other circulatory complications: Secondary | ICD-10-CM | POA: Diagnosis not present

## 2016-02-02 DIAGNOSIS — I2510 Atherosclerotic heart disease of native coronary artery without angina pectoris: Secondary | ICD-10-CM | POA: Diagnosis not present

## 2016-02-11 DIAGNOSIS — H6060 Unspecified chronic otitis externa, unspecified ear: Secondary | ICD-10-CM | POA: Diagnosis not present

## 2016-02-29 DIAGNOSIS — I10 Essential (primary) hypertension: Secondary | ICD-10-CM | POA: Diagnosis not present

## 2016-02-29 DIAGNOSIS — N50819 Testicular pain, unspecified: Secondary | ICD-10-CM | POA: Diagnosis not present

## 2016-02-29 DIAGNOSIS — K219 Gastro-esophageal reflux disease without esophagitis: Secondary | ICD-10-CM | POA: Diagnosis not present

## 2016-02-29 DIAGNOSIS — E1165 Type 2 diabetes mellitus with hyperglycemia: Secondary | ICD-10-CM | POA: Diagnosis not present

## 2016-03-15 DIAGNOSIS — N50819 Testicular pain, unspecified: Secondary | ICD-10-CM | POA: Diagnosis not present

## 2016-03-22 DIAGNOSIS — R10819 Abdominal tenderness, unspecified site: Secondary | ICD-10-CM | POA: Diagnosis not present

## 2016-03-29 ENCOUNTER — Telehealth: Payer: Self-pay | Admitting: *Deleted

## 2016-04-11 DIAGNOSIS — N50819 Testicular pain, unspecified: Secondary | ICD-10-CM | POA: Diagnosis not present

## 2016-04-11 DIAGNOSIS — I10 Essential (primary) hypertension: Secondary | ICD-10-CM | POA: Diagnosis not present

## 2016-04-11 DIAGNOSIS — E1165 Type 2 diabetes mellitus with hyperglycemia: Secondary | ICD-10-CM | POA: Diagnosis not present

## 2016-04-11 DIAGNOSIS — R10819 Abdominal tenderness, unspecified site: Secondary | ICD-10-CM | POA: Diagnosis not present

## 2016-04-11 NOTE — Telephone Encounter (Signed)
error 

## 2016-06-14 DIAGNOSIS — I10 Essential (primary) hypertension: Secondary | ICD-10-CM | POA: Diagnosis not present

## 2016-06-14 DIAGNOSIS — E1165 Type 2 diabetes mellitus with hyperglycemia: Secondary | ICD-10-CM | POA: Diagnosis not present

## 2016-06-14 DIAGNOSIS — J309 Allergic rhinitis, unspecified: Secondary | ICD-10-CM | POA: Diagnosis not present

## 2016-06-14 DIAGNOSIS — K219 Gastro-esophageal reflux disease without esophagitis: Secondary | ICD-10-CM | POA: Diagnosis not present

## 2016-08-07 DIAGNOSIS — I2510 Atherosclerotic heart disease of native coronary artery without angina pectoris: Secondary | ICD-10-CM | POA: Diagnosis not present

## 2016-08-07 DIAGNOSIS — E782 Mixed hyperlipidemia: Secondary | ICD-10-CM | POA: Diagnosis not present

## 2016-08-07 DIAGNOSIS — I10 Essential (primary) hypertension: Secondary | ICD-10-CM | POA: Diagnosis not present

## 2016-09-05 DIAGNOSIS — I2510 Atherosclerotic heart disease of native coronary artery without angina pectoris: Secondary | ICD-10-CM | POA: Diagnosis not present

## 2016-09-05 DIAGNOSIS — M6281 Muscle weakness (generalized): Secondary | ICD-10-CM | POA: Diagnosis not present

## 2016-09-05 DIAGNOSIS — F0390 Unspecified dementia without behavioral disturbance: Secondary | ICD-10-CM | POA: Diagnosis not present

## 2016-09-07 DIAGNOSIS — E782 Mixed hyperlipidemia: Secondary | ICD-10-CM | POA: Diagnosis not present

## 2016-09-07 DIAGNOSIS — I2510 Atherosclerotic heart disease of native coronary artery without angina pectoris: Secondary | ICD-10-CM | POA: Diagnosis not present

## 2016-09-07 DIAGNOSIS — I10 Essential (primary) hypertension: Secondary | ICD-10-CM | POA: Diagnosis not present

## 2016-09-12 DIAGNOSIS — I10 Essential (primary) hypertension: Secondary | ICD-10-CM | POA: Diagnosis not present

## 2016-09-12 DIAGNOSIS — K219 Gastro-esophageal reflux disease without esophagitis: Secondary | ICD-10-CM | POA: Diagnosis not present

## 2016-09-12 DIAGNOSIS — L723 Sebaceous cyst: Secondary | ICD-10-CM | POA: Diagnosis not present

## 2016-09-12 DIAGNOSIS — E1165 Type 2 diabetes mellitus with hyperglycemia: Secondary | ICD-10-CM | POA: Diagnosis not present

## 2016-10-31 DIAGNOSIS — K219 Gastro-esophageal reflux disease without esophagitis: Secondary | ICD-10-CM | POA: Diagnosis not present

## 2016-10-31 DIAGNOSIS — E1165 Type 2 diabetes mellitus with hyperglycemia: Secondary | ICD-10-CM | POA: Diagnosis not present

## 2016-10-31 DIAGNOSIS — I10 Essential (primary) hypertension: Secondary | ICD-10-CM | POA: Diagnosis not present

## 2016-10-31 DIAGNOSIS — Z0001 Encounter for general adult medical examination with abnormal findings: Secondary | ICD-10-CM | POA: Diagnosis not present

## 2016-12-12 DIAGNOSIS — E1165 Type 2 diabetes mellitus with hyperglycemia: Secondary | ICD-10-CM | POA: Diagnosis not present

## 2016-12-12 DIAGNOSIS — K219 Gastro-esophageal reflux disease without esophagitis: Secondary | ICD-10-CM | POA: Diagnosis not present

## 2016-12-12 DIAGNOSIS — I10 Essential (primary) hypertension: Secondary | ICD-10-CM | POA: Diagnosis not present

## 2017-01-05 ENCOUNTER — Other Ambulatory Visit: Payer: Self-pay

## 2017-01-05 MED ORDER — AZITHROMYCIN 250 MG PO TABS: ORAL_TABLET | ORAL | 0 refills | Status: DC

## 2017-01-11 ENCOUNTER — Other Ambulatory Visit: Payer: Self-pay

## 2017-01-11 MED ORDER — PANTOPRAZOLE SODIUM 40 MG PO TBEC: 40.0000 mg | DELAYED_RELEASE_TABLET | Freq: Two times a day (BID) | ORAL | 3 refills | Status: DC

## 2017-03-12 DIAGNOSIS — I2510 Atherosclerotic heart disease of native coronary artery without angina pectoris: Secondary | ICD-10-CM | POA: Diagnosis not present

## 2017-03-12 DIAGNOSIS — I10 Essential (primary) hypertension: Secondary | ICD-10-CM | POA: Diagnosis not present

## 2017-03-12 DIAGNOSIS — R931 Abnormal findings on diagnostic imaging of heart and coronary circulation: Secondary | ICD-10-CM | POA: Diagnosis not present

## 2017-03-12 DIAGNOSIS — E1159 Type 2 diabetes mellitus with other circulatory complications: Secondary | ICD-10-CM | POA: Diagnosis not present

## 2017-03-16 ENCOUNTER — Ambulatory Visit: Payer: Self-pay | Admitting: Nurse Practitioner

## 2017-03-20 ENCOUNTER — Other Ambulatory Visit: Payer: Self-pay

## 2017-03-20 MED ORDER — AZITHROMYCIN 250 MG PO TABS: ORAL_TABLET | ORAL | 0 refills | Status: DC

## 2017-03-20 NOTE — Telephone Encounter (Signed)
Pt called  That having sinus infection he is not able to come in as per heather send zpak/np

## 2017-03-30 ENCOUNTER — Other Ambulatory Visit: Payer: Self-pay

## 2017-03-30 MED ORDER — GLYBURIDE 5 MG PO TABS: ORAL_TABLET | ORAL | 1 refills | Status: DC

## 2017-05-14 ENCOUNTER — Other Ambulatory Visit: Payer: Self-pay

## 2017-05-14 MED ORDER — GLUCOSE BLOOD VI STRP: ORAL_STRIP | 3 refills | Status: DC

## 2017-06-06 ENCOUNTER — Other Ambulatory Visit: Payer: Self-pay | Admitting: Internal Medicine

## 2017-06-06 MED ORDER — SUCRALFATE 1 G PO TABS: ORAL_TABLET | ORAL | 1 refills | Status: DC

## 2017-07-17 ENCOUNTER — Other Ambulatory Visit: Payer: Self-pay

## 2017-07-17 MED ORDER — PANTOPRAZOLE SODIUM 40 MG PO TBEC: 40.0000 mg | DELAYED_RELEASE_TABLET | Freq: Two times a day (BID) | ORAL | 3 refills | Status: DC

## 2017-08-06 ENCOUNTER — Encounter: Payer: Self-pay | Admitting: Nurse Practitioner

## 2017-08-06 ENCOUNTER — Ambulatory Visit: Payer: BLUE CROSS/BLUE SHIELD | Admitting: Nurse Practitioner

## 2017-08-06 VITALS — BP 109/71 | HR 91 | Resp 16 | Ht 71.0 in | Wt 228.6 lb

## 2017-08-06 DIAGNOSIS — E782 Mixed hyperlipidemia: Secondary | ICD-10-CM | POA: Diagnosis not present

## 2017-08-06 DIAGNOSIS — E1165 Type 2 diabetes mellitus with hyperglycemia: Secondary | ICD-10-CM | POA: Diagnosis not present

## 2017-08-06 DIAGNOSIS — L720 Epidermal cyst: Secondary | ICD-10-CM | POA: Diagnosis not present

## 2017-08-06 DIAGNOSIS — I10 Essential (primary) hypertension: Secondary | ICD-10-CM | POA: Diagnosis not present

## 2017-08-06 LAB — POCT GLYCOSYLATED HEMOGLOBIN (HGB A1C): Hemoglobin A1C: 8.6 % — AB (ref 4.0–5.6)

## 2017-08-06 NOTE — Progress Notes (Signed)
[facility] [place], [place] [address]  Internal MEDICINE  Office Visit Note  Patient Name: Ricardo Durham  [id number]  [id number]  Date of Service: 08/31/2017  Chief Complaint  Patient presents with  . Diabetes  . cyst on back    needs to be seen by dermatologist    The patient has cyst lesion on upper back, just to the right of the spine. Did see dermatology twice in the past. Was incised once, but dermatologist he saw did not want to cut further into the cyst. No changes to the cyst. Not tender and no evidence of infection. No drainage. Was never referred to different dermatologist or surgeon for further evaluation.    Diabetes  He presents for his follow-up diabetic visit. He has type 2 diabetes mellitus. His disease course has been stable. Hypoglycemia symptoms include nervousness/anxiousness. Pertinent negatives for hypoglycemia include no dizziness, headaches or tremors. There are no diabetic associated symptoms. Pertinent negatives for diabetes include no chest pain and no fatigue. There are no hypoglycemic complications. Symptoms are stable. There are no diabetic complications. Risk factors for coronary artery disease include dyslipidemia, hypertension, male sex and diabetes mellitus. He is compliant with treatment most of the time. He is following a generally healthy diet. When asked about meal planning, he reported none. He has not had a previous visit with a dietitian. He participates in exercise intermittently. There is no change in his home blood glucose trend. An ACE inhibitor/angiotensin II receptor blocker is being taken. He does not see a podiatrist.      Current Medication: Outpatient Encounter Medications as of 08/06/2017  Medication Sig  . ALOE VERA MOISTURIZING EX Apply topically.  Marland Kitchen aspirin 81 MG tablet Take 81 mg by mouth every other day.  Marland Kitchen atorvastatin (LIPITOR) 80 MG tablet Take 1 tablet (80 mg total) by mouth daily.  Marland Kitchen glucose blood  (ACCU-CHEK AVIVA PLUS) test strip Use once daily for sugar e11.65  . glyBURIDE (DIABETA) 5 MG tablet Take 2 tab by twice  daily  . isosorbide mononitrate (IMDUR) 30 MG 24 hr tablet Take 30 mg by mouth daily.  Marland Kitchen lisinopril (PRINIVIL,ZESTRIL) 5 MG tablet Take 5 mg by mouth daily.  . pantoprazole (PROTONIX) 40 MG tablet Take 1 tablet (40 mg total) by mouth 2 (two) times daily.  . sitaGLIPtin-metformin (JANUMET) 50-1000 MG tablet Take 1 tablet by mouth 2 (two) times daily with a meal.  . [DISCONTINUED] sucralfate (CARAFATE) 1 g tablet Take one tab by mouth three times a day prior to meals  . azithromycin (ZITHROMAX) 250 MG tablet Take 2 tab first day and one tab daily for 4 days   No facility-administered encounter medications on file as of 08/06/2017.     Surgical History: Past Surgical History:  Procedure Laterality Date  . CARDIAC CATHETERIZATION Left 08/24/2015   Procedure: Left Heart Cath and Coronary Angiography;  Surgeon: Corey Skains, MD;  Location: [facility];  Service: Cardiovascular;  Laterality: Left;  . COLONOSCOPY WITH PROPOFOL N/A 10/26/2014   Procedure: COLONOSCOPY WITH PROPOFOL;  Surgeon: Lollie Sails, MD;  Location: [facility] ENDOSCOPY;  Service: Endoscopy;  Laterality: N/A;  . ESOPHAGOGASTRODUODENOSCOPY (EGD) WITH PROPOFOL N/A 10/26/2014   Procedure: ESOPHAGOGASTRODUODENOSCOPY (EGD) WITH PROPOFOL;  Surgeon: Lollie Sails, MD;  Location: [facility] ENDOSCOPY;  Service: Endoscopy;  Laterality: N/A;  . NECK SURGERY     c 6-7 due to stable fracture  . NISSEN FUNDOPLICATION    . UMBILICAL HERNIA REPAIR  2001    Medical History: Past Medical History:  Diagnosis Date  . COPD (chronic obstructive pulmonary disease) ([place])   . Diabetes mellitus without complication ([place])     Family History: History reviewed. No pertinent family history.  Social History   Socioeconomic History  . Marital status: Divorced    Spouse name: Not on file  . Number of children: Not on  file  . Years of education: Not on file  . Highest education level: Not on file  Occupational History  . Not on file  Social Needs  . Financial resource strain: Not on file  . Food insecurity:    Worry: Not on file    Inability: Not on file  . Transportation needs:    Medical: Not on file    Non-medical: Not on file  Tobacco Use  . Smoking status: Former Smoker    Types: Cigarettes  . Smokeless tobacco: Never Used  Substance and Sexual Activity  . Alcohol use: Yes    Alcohol/week: 1.0 standard drinks    Types: 1 Cans of beer per week    Comment: occasionally  . Drug use: No  . Sexual activity: Not on file  Lifestyle  . Physical activity:    Days per week: Not on file    Minutes per session: Not on file  . Stress: Not on file  Relationships  . Social connections:    Talks on phone: Not on file    Gets together: Not on file    Attends religious service: Not on file    Active member of club or organization: Not on file    Attends meetings of clubs or organizations: Not on file    Relationship status: Not on file  . Intimate partner violence:    Fear of current or ex partner: Not on file    Emotionally abused: Not on file    Physically abused: Not on file    Forced sexual activity: Not on file  Other Topics Concern  . Not on file  Social History Narrative  . Not on file      Review of Systems  Constitutional: Negative for activity change, chills, fatigue and unexpected weight change.  HENT: Negative for congestion, postnasal drip, rhinorrhea, sneezing and sore throat.   Eyes: Negative.  Negative for redness.  Respiratory: Negative for cough, chest tightness and shortness of breath.   Cardiovascular: Negative for chest pain and palpitations.  Gastrointestinal: Negative for abdominal pain, constipation, diarrhea, nausea and vomiting.  Endocrine:       The patient's blood sugars not well controlled as he admits to not remembering medication at all times.    Genitourinary: Negative.  Negative for dysuria and frequency.  Musculoskeletal: Negative for arthralgias, back pain, joint swelling and neck pain.  Skin: Negative for rash.       Cyst on upper back which is getting bigger.   Allergic/Immunologic: Positive for environmental allergies.  Neurological: Negative for dizziness, tremors, numbness and headaches.  Hematological: Negative for adenopathy. Does not bruise/bleed easily.  Psychiatric/Behavioral: Negative for behavioral problems (Depression), sleep disturbance and suicidal ideas. The patient is nervous/anxious.     Today's Vitals   08/06/17 1501  BP: 109/71  Pulse: 91  Resp: 16  SpO2: 96%  Weight: 228 lb 9.6 oz (103.7 kg)  Height: 5\' 11"  (1.803 m)    Physical Exam  Constitutional: He is oriented to person, place, and time. He appears well-developed and well-nourished. No distress.  HENT:  Head: Normocephalic  and atraumatic.  Nose: Nose normal.  Mouth/Throat: Oropharynx is clear and moist. No oropharyngeal exudate.  Eyes: Pupils are equal, round, and reactive to light. Conjunctivae and EOM are normal.  Neck: Normal range of motion. Neck supple. No JVD present. No tracheal deviation present. No thyromegaly present.  Cardiovascular: Normal rate, regular rhythm and normal heart sounds. Exam reveals no gallop and no friction rub.  No murmur heard. Pulmonary/Chest: Effort normal and breath sounds normal. No respiratory distress. He has no wheezes. He has no rales. He exhibits no tenderness.  Abdominal: Soft. Bowel sounds are normal. There is no tenderness.  Musculoskeletal: Normal range of motion.  Lymphadenopathy:    He has no cervical adenopathy.  Neurological: He is alert and oriented to person, place, and time. No cranial nerve deficit.  Skin: Skin is warm and dry. He is not diaphoretic.  There is cyst-like lesion on upper back, adjacent to his spine. It is approximately the circumference of a quarter. Edges are smooth. There  is some redness present without evidence of infection. Slightly tender to palpate.   Psychiatric: He has a normal mood and affect. His behavior is normal. Judgment and thought content normal.  Nursing note and vitals reviewed.  Assessment/Plan: 1. Uncontrolled type 2 diabetes mellitus with hyperglycemia (HCC) - POCT HgB A1C 8.4 today. No medication chages today. Discussed importance of taking all meds as prescribed. Reviewed risk factors and consequesnces of long-term, uncontrolled diabetes. Referred for diabetic eye exam.  - Microalbumin, urine - Ambulatory referral to Ophthalmology  2. Cyst of skin and subcutaneous tissue Referred to dermatology for further evaluation and treatment.  - Ambulatory referral to Dermatology  3. Essential hypertension Stable. Continue bp medication as prescribed.   4. Mixed hyperlipidemia Continue atorvastatin as precribed.   General Counseling: brodrick curran understanding of the findings of todays visit and agrees with plan of treatment. I have discussed any further diagnostic evaluation that may be needed or ordered today. We also reviewed his medications today. he has been encouraged to call the office with any questions or concerns that should arise related to todays visit.  Diabetes Counseling:  1. Addition of ACE inh/ ARB'S for nephroprotection. Microalbumin is updated  2. Diabetic foot care, prevention of complications. Podiatry consult 3. Exercise and lose weight.  4. Diabetic eye examination, Diabetic eye exam is updated  5. Monitor blood sugar closlely. nutrition counseling.  6. Sign and symptoms of hypoglycemia including shaking sweating,confusion and headaches.   This patient was seen by Leretha Pol FNP Collaboration with Dr Lavera Guise as a part of collaborative care agreement  Orders Placed This Encounter  Procedures  . Microalbumin, urine  . Specimen status report  . Ambulatory referral to Ophthalmology  . Ambulatory referral  to Dermatology  . POCT HgB A1C     Time spent: 25 Minutes      Dr Lavera Guise Internal medicine

## 2017-08-07 LAB — SPECIMEN STATUS REPORT

## 2017-08-07 LAB — MICROALBUMIN, URINE

## 2017-08-07 NOTE — Progress Notes (Signed)
Hey. Can you find out why they canceled this order? Thanks.

## 2017-08-08 ENCOUNTER — Telehealth: Payer: Self-pay

## 2017-08-08 ENCOUNTER — Other Ambulatory Visit: Payer: Self-pay | Admitting: Internal Medicine

## 2017-08-08 MED ORDER — SUCRALFATE 1 G PO TABS: ORAL_TABLET | ORAL | 1 refills | Status: DC

## 2017-08-09 NOTE — Telephone Encounter (Signed)
done

## 2017-08-31 DIAGNOSIS — E782 Mixed hyperlipidemia: Secondary | ICD-10-CM | POA: Insufficient documentation

## 2017-08-31 DIAGNOSIS — E1165 Type 2 diabetes mellitus with hyperglycemia: Secondary | ICD-10-CM | POA: Insufficient documentation

## 2017-08-31 DIAGNOSIS — I10 Essential (primary) hypertension: Secondary | ICD-10-CM | POA: Insufficient documentation

## 2017-08-31 DIAGNOSIS — L720 Epidermal cyst: Secondary | ICD-10-CM | POA: Insufficient documentation

## 2017-09-06 DIAGNOSIS — I2510 Atherosclerotic heart disease of native coronary artery without angina pectoris: Secondary | ICD-10-CM | POA: Diagnosis not present

## 2017-09-06 DIAGNOSIS — I10 Essential (primary) hypertension: Secondary | ICD-10-CM | POA: Diagnosis not present

## 2017-09-06 DIAGNOSIS — E782 Mixed hyperlipidemia: Secondary | ICD-10-CM | POA: Diagnosis not present

## 2017-09-11 DIAGNOSIS — I2510 Atherosclerotic heart disease of native coronary artery without angina pectoris: Secondary | ICD-10-CM | POA: Diagnosis not present

## 2017-09-14 DIAGNOSIS — H9193 Unspecified hearing loss, bilateral: Secondary | ICD-10-CM | POA: Diagnosis not present

## 2017-09-14 DIAGNOSIS — H9313 Tinnitus, bilateral: Secondary | ICD-10-CM | POA: Diagnosis not present

## 2017-09-17 ENCOUNTER — Other Ambulatory Visit: Payer: Self-pay

## 2017-09-17 MED ORDER — GLYBURIDE 5 MG PO TABS: ORAL_TABLET | ORAL | 1 refills | Status: DC

## 2017-10-01 ENCOUNTER — Encounter: Payer: Self-pay | Admitting: Adult Health

## 2017-10-01 ENCOUNTER — Ambulatory Visit: Payer: BLUE CROSS/BLUE SHIELD | Admitting: Adult Health

## 2017-10-01 VITALS — BP 118/86 | HR 76 | Resp 16 | Ht 71.0 in | Wt 227.0 lb

## 2017-10-01 DIAGNOSIS — R300 Dysuria: Secondary | ICD-10-CM | POA: Diagnosis not present

## 2017-10-01 DIAGNOSIS — Z0001 Encounter for general adult medical examination with abnormal findings: Secondary | ICD-10-CM | POA: Diagnosis not present

## 2017-10-01 DIAGNOSIS — E1165 Type 2 diabetes mellitus with hyperglycemia: Secondary | ICD-10-CM

## 2017-10-01 DIAGNOSIS — J449 Chronic obstructive pulmonary disease, unspecified: Secondary | ICD-10-CM | POA: Diagnosis not present

## 2017-10-01 DIAGNOSIS — E782 Mixed hyperlipidemia: Secondary | ICD-10-CM

## 2017-10-01 DIAGNOSIS — K219 Gastro-esophageal reflux disease without esophagitis: Secondary | ICD-10-CM | POA: Diagnosis not present

## 2017-10-01 DIAGNOSIS — I10 Essential (primary) hypertension: Secondary | ICD-10-CM

## 2017-10-01 LAB — POCT GLYCOSYLATED HEMOGLOBIN (HGB A1C): HEMOGLOBIN A1C: 8.3 % — AB (ref 4.0–5.6)

## 2017-10-01 NOTE — Progress Notes (Signed)
[facility] [place], [place] [address]  Internal MEDICINE  Office Visit Note  Patient Name: Ricardo Durham  [id number]  [id number]  Date of Service: 10/09/2017  Chief Complaint  Patient presents with  . Annual Exam  . Diabetes  . COPD   HPI Pt is here for routine health maintenance examination.  He reports a history of COPD,HLD, HTM, GERD, and DM.  He is generally doing well. His HGA1C is currently 8.3. He controls is HTN and HLD with medications. He reports his GERD is controlled by Protonix. He denies complaints currently.  He is able to perform his own ADL's.  He lives with his mother who has alzheimers currently. 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  He denies tobacco or alcohol use.     Current Medication: Outpatient Encounter Medications as of 10/01/2017  Medication Sig  . ALOE VERA MOISTURIZING EX Apply topically.  Marland Kitchen aspirin 81 MG tablet Take 81 mg by mouth every other day.  Marland Kitchen atorvastatin (LIPITOR) 80 MG tablet Take 1 tablet (80 mg total) by mouth daily.  Marland Kitchen glucose blood (ACCU-CHEK AVIVA PLUS) test strip Use once daily for sugar e11.65  . glyBURIDE (DIABETA) 5 MG tablet Take 2 tab by twice  daily  . isosorbide mononitrate (IMDUR) 30 MG 24 hr tablet Take 30 mg by mouth daily.  Marland Kitchen lisinopril (PRINIVIL,ZESTRIL) 5 MG tablet Take 5 mg by mouth daily.  . pantoprazole (PROTONIX) 40 MG tablet Take 1 tablet (40 mg total) by mouth 2 (two) times daily.  . sitaGLIPtin-metformin (JANUMET) 50-1000 MG tablet Take 1 tablet by mouth 2 (two) times daily with a meal.  . sucralfate (CARAFATE) 1 g tablet Take one tab by mouth three times a day prior to meals  . [DISCONTINUED] azithromycin (ZITHROMAX) 250 MG tablet Take 2 tab first day and one tab daily for 4 days (Patient not taking: Reported on 10/01/2017)   No facility-administered encounter medications on file as of 10/01/2017.     Surgical History: Past Surgical History:   Procedure Laterality Date  . CARDIAC CATHETERIZATION Left 08/24/2015   Procedure: Left Heart Cath and Coronary Angiography;  Surgeon: Corey Skains, MD;  Location: [facility];  Service: Cardiovascular;  Laterality: Left;  . COLONOSCOPY WITH PROPOFOL N/A 10/26/2014   Procedure: COLONOSCOPY WITH PROPOFOL;  Surgeon: Lollie Sails, MD;  Location: [facility] ENDOSCOPY;  Service: Endoscopy;  Laterality: N/A;  . ESOPHAGOGASTRODUODENOSCOPY (EGD) WITH PROPOFOL N/A 10/26/2014   Procedure: ESOPHAGOGASTRODUODENOSCOPY (EGD) WITH PROPOFOL;  Surgeon: Lollie Sails, MD;  Location: [facility] ENDOSCOPY;  Service: Endoscopy;  Laterality: N/A;  . NECK SURGERY     c 6-7 due to stable fracture  . NISSEN FUNDOPLICATION    . UMBILICAL HERNIA REPAIR  2001    Medical History: Past Medical History:  Diagnosis Date  . COPD (chronic obstructive pulmonary disease) ([place])   . Diabetes mellitus without complication ([place])     Family History: Family History  Problem Relation Age of Onset  . Diabetes Father    Review of Systems  Constitutional: Negative.  Negative for chills, fatigue and unexpected weight change.  HENT: Negative.  Negative for congestion, rhinorrhea, sneezing and sore throat.   Eyes: Negative for redness.  Respiratory: Negative.  Negative for cough, chest tightness and shortness of breath.   Cardiovascular: Negative.  Negative for chest pain and palpitations.  Gastrointestinal: Negative.  Negative for abdominal pain, constipation, diarrhea, nausea and  vomiting.  Endocrine: Negative.   Genitourinary: Negative.  Negative for dysuria and frequency.  Musculoskeletal: Negative.  Negative for arthralgias, back pain, joint swelling and neck pain.  Skin: Negative.  Negative for rash.  Allergic/Immunologic: Negative.   Neurological: Negative.  Negative for tremors and numbness.  Hematological: Negative for adenopathy. Does not bruise/bleed easily.  Psychiatric/Behavioral: Negative.  Negative for  behavioral problems, sleep disturbance and suicidal ideas. The patient is not nervous/anxious.    Vital Signs: BP 118/86   Pulse 76   Resp 16   Ht 5\' 11"  (1.803 m)   Wt 227 lb (103 kg)   SpO2 97%   BMI 31.66 kg/m  Physical Exam  Constitutional: He is oriented to person, place, and time. He appears well-developed and well-nourished. No distress.  HENT:  Head: Normocephalic and atraumatic.  Mouth/Throat: Oropharynx is clear and moist. No oropharyngeal exudate.  Eyes: Pupils are equal, round, and reactive to light. EOM are normal.  Neck: Normal range of motion. Neck supple. No JVD present. No tracheal deviation present. No thyromegaly present.  Cardiovascular: Normal rate, regular rhythm and normal heart sounds. Exam reveals no gallop and no friction rub.  No murmur heard. Pulmonary/Chest: Effort normal and breath sounds normal. No respiratory distress. He has no wheezes. He has no rales. He exhibits no tenderness.  Abdominal: Soft. There is no tenderness. There is no guarding.  Musculoskeletal: Normal range of motion.  Lymphadenopathy:    He has no cervical adenopathy.  Neurological: He is alert and oriented to person, place, and time. No cranial nerve deficit.  Skin: Skin is warm and dry. He is not diaphoretic.  Psychiatric: He has a normal mood and affect. His behavior is normal. Judgment and thought content normal.  Nursing note and vitals reviewed.  LABS: Recent Results (from the past 2160 hour(s))  POCT HgB A1C     Status: Abnormal   Collection Time: 08/06/17  3:19 PM  Result Value Ref Range   Hemoglobin A1C 8.6 (A) 4.0 - 5.6 %   HbA1c POC (<> result, manual entry)  4.0 - 5.6 %   HbA1c, POC (prediabetic range)  5.7 - 6.4 %   HbA1c, POC (controlled diabetic range)  0.0 - 7.0 %  Microalbumin, urine     Status: None   Collection Time: 08/06/17  4:13 PM  Result Value Ref Range   Microalbumin, Urine CANCELED ug/mL    Comment: The specimen submitted does not meet the  laboratory's criteria for acceptability. Refer to [employer] of Services for specimen acceptability criteria. received: urine gray requires: urine bottle  Result canceled by the ancillary.   Specimen status report     Status: None   Collection Time: 08/06/17  4:13 PM  Result Value Ref Range   specimen status report Comment     Comment: NTI Urine Tube [employer]) NTI Urine Tube [employer]) Comment: A urine culture transport was received with no test indicated. If testing is required on this specimen, please contact the [facility] [employer]ical [employer] Department to obtain a Request for Written Authorization Form.   UA/M w/rflx Culture, Routine     Status: None   Collection Time: 10/01/17  3:31 PM  Result Value Ref Range   Specific Gravity, UA 1.023 1.005 - 1.030   pH, UA 5.0 5.0 - 7.5   Color, UA Yellow Yellow   Appearance Ur Clear Clear   Leukocytes, UA Negative Negative   Protein, UA Negative Negative/Trace   Glucose, UA Negative Negative   Ketones,  UA Negative Negative   RBC, UA Negative Negative   Bilirubin, UA Negative Negative   Urobilinogen, Ur 0.2 0.2 - 1.0 mg/dL   Nitrite, UA Negative Negative   Microscopic Examination Comment     Comment: Microscopic follows if indicated.   Microscopic Examination See below:     Comment: Microscopic was indicated and was performed.   Urinalysis Reflex Comment     Comment: This specimen will not reflex to a Urine Culture.  Microscopic Examination     Status: None   Collection Time: 10/01/17  3:31 PM  Result Value Ref Range   WBC, UA 0-5 0 - 5 /hpf   RBC, UA 0-2 0 - 2 /hpf   Epithelial Cells (non renal) None seen 0 - 10 /hpf   Casts None seen None seen /lpf   Mucus, UA Present Not Estab.   Bacteria, UA Few None seen/Few  POCT HgB A1C     Status: Abnormal   Collection Time: 10/01/17  3:46 PM  Result Value Ref Range   Hemoglobin A1C 8.3 (A) 4.0 - 5.6 %   HbA1c POC (<> result, manual entry)     HbA1c, POC  (prediabetic range)     HbA1c, POC (controlled diabetic range)    CBC with Differential/Platelet     Status: None   Collection Time: 10/01/17  4:45 PM  Result Value Ref Range   WBC 8.2 3.4 - 10.8 x10E3/uL   RBC 4.63 4.14 - 5.80 x10E6/uL   Hemoglobin 13.7 13.0 - 17.7 g/dL   Hematocrit 40.1 37.5 - 51.0 %   MCV 87 79 - 97 fL   MCH 29.6 26.6 - 33.0 pg   MCHC 34.2 31.5 - 35.7 g/dL   RDW 13.4 12.3 - 15.4 %   Platelets 306 150 - 450 x10E3/uL   Neutrophils 60 Not Estab. %   Lymphs 27 Not Estab. %   Monocytes 9 Not Estab. %   Eos 2 Not Estab. %   Basos 1 Not Estab. %   Neutrophils Absolute 5.0 1.4 - 7.0 x10E3/uL   Lymphocytes Absolute 2.2 0.7 - 3.1 x10E3/uL   Monocytes Absolute 0.7 0.1 - 0.9 x10E3/uL   EOS (ABSOLUTE) 0.1 0.0 - 0.4 x10E3/uL   Basophils Absolute 0.1 0.0 - 0.2 x10E3/uL   Immature Granulocytes 1 Not Estab. %   Immature Grans (Abs) 0.0 0.0 - 0.1 x10E3/uL  Lipid Panel With LDL/HDL Ratio     Status: None   Collection Time: 10/01/17  4:45 PM  Result Value Ref Range   Cholesterol, Total 111 100 - 199 mg/dL   Triglycerides 43 0 - 149 mg/dL   HDL 41 >39 mg/dL   VLDL Cholesterol Cal 9 5 - 40 mg/dL   LDL Calculated 61 0 - 99 mg/dL   LDl/HDL Ratio 1.5 0.0 - 3.6 ratio    Comment:                                     LDL/HDL Ratio                                             Men  Women  1/2 Avg.Risk  1.0    1.5                                   Avg.Risk  3.6    3.2                                2X Avg.Risk  6.2    5.0                                3X Avg.Risk  8.0    6.1   TSH     Status: None   Collection Time: 10/01/17  4:45 PM  Result Value Ref Range   TSH 2.840 0.450 - 4.500 uIU/mL  T4, free     Status: None   Collection Time: 10/01/17  4:45 PM  Result Value Ref Range   Free T4 1.19 0.82 - 1.77 ng/dL  Comprehensive metabolic panel     Status: Abnormal   Collection Time: 10/01/17  4:45 PM  Result Value Ref Range   Glucose 90 65 - 99 mg/dL    BUN 18 8 - 27 mg/dL   Creatinine, Ser 1.00 0.76 - 1.27 mg/dL   GFR calc non Af Amer 81 >59 mL/min/1.73   GFR calc Af Amer 93 >59 mL/min/1.73   BUN/Creatinine Ratio 18 10 - 24   Sodium 140 134 - 144 mmol/L   Potassium 4.3 3.5 - 5.2 mmol/L   Chloride 103 96 - 106 mmol/L   CO2 23 20 - 29 mmol/L   Calcium 9.0 8.6 - 10.2 mg/dL   Total Protein 6.6 6.0 - 8.5 g/dL   Albumin 4.6 3.6 - 4.8 g/dL   Globulin, Total 2.0 1.5 - 4.5 g/dL   Albumin/Globulin Ratio 2.3 (H) 1.2 - 2.2   Bilirubin Total 0.4 0.0 - 1.2 mg/dL   Alkaline Phosphatase 69 39 - 117 IU/L   AST 18 0 - 40 IU/L   ALT 23 0 - 44 IU/L  PSA     Status: None   Collection Time: 10/01/17  4:45 PM  Result Value Ref Range   Prostate Specific Ag, Serum 1.3 0.0 - 4.0 ng/mL    Comment: Roche ECLIA methodology. According to the American Urological Association, Serum PSA should decrease and remain at undetectable levels after radical prostatectomy. The AUA defines biochemical recurrence as an initial PSA value 0.2 ng/mL or greater followed by a subsequent confirmatory PSA value 0.2 ng/mL or greater. Values obtained with different assay methods or kits cannot be used interchangeably. Results cannot be interpreted as absolute evidence of the presence or absence of malignant disease.   HIV antibody (with reflex)     Status: None   Collection Time: 10/01/17  4:45 PM  Result Value Ref Range   HIV Screen 4th Generation wRfx Non Reactive Non Reactive  Hepatitis c antibody (reflex)     Status: None   Collection Time: 10/01/17  4:45 PM  Result Value Ref Range   HCV Ab <0.1 0.0 - 0.9 s/co ratio  HCV Comment:     Status: None   Collection Time: 10/01/17  4:45 PM  Result Value Ref Range   Comment: Comment     Comment: Non reactive HCV antibody screen is consistent with no HCV infection, unless recent infection is suspected or  other evidence exists to indicate HCV infection.    Assessment/Plan: 1. Encounter for general adult medical  examination with abnormal findings Blood work ordered and sent [facility]   CBC, CMP, TSH, FT4, PSA, Lipid panel.   2. Uncontrolled type 2 diabetes mellitus with hyperglycemia (HCC) - POCT HgB A1C. Slightly improved from previous visit, will change therapy on next visit, update micro albumine, diabetic eye and foot exam   3. Gastroesophageal reflux disease without esophagitis Good results with Protonix.  Continue as directed.   4. Chronic obstructive pulmonary disease, unspecified COPD type ([place]) Uses rescue inhaler at times.   5. Essential hypertension Controlled on current regimen.   6. Mixed hyperlipidemia Will draw lipid panel.   7. Dysuria - UA/M w/rflx Culture, Routine  General Counseling: Matthews verbalizes understanding of the findings of todays visit and agrees with plan of treatment. I have discussed any further diagnostic evaluation that may be needed or ordered today. We also reviewed his medications today. he has been encouraged to call the office with any questions or concerns that should arise related to todays visit.   Orders Placed This Encounter  Procedures  . Microscopic Examination  . UA/M w/rflx Culture, Routine  . CBC with Differential/Platelet  . Lipid Panel With LDL/HDL Ratio  . TSH  . T4, free  . Comprehensive metabolic panel  . PSA  . HIV antibody (with reflex)  . Hepatitis c antibody (reflex)  . HCV Comment:  . POCT HgB A1C     Time spent: 30 Minutes   This patient was seen by [name] AGNP-C in Collaboration with Dr Lavera Guise as a part of collaborative care agreement   Lavera Guise, MD  Internal Medicine

## 2017-10-01 NOTE — Patient Instructions (Signed)

## 2017-10-02 LAB — CBC WITH DIFFERENTIAL/PLATELET
BASOS: 1 %
Basophils Absolute: 0.1 10*3/uL (ref 0.0–0.2)
EOS (ABSOLUTE): 0.1 10*3/uL (ref 0.0–0.4)
EOS: 2 %
HEMATOCRIT: 40.1 % (ref 37.5–51.0)
Hemoglobin: 13.7 g/dL (ref 13.0–17.7)
Immature Grans (Abs): 0 10*3/uL (ref 0.0–0.1)
Immature Granulocytes: 1 %
LYMPHS ABS: 2.2 10*3/uL (ref 0.7–3.1)
Lymphs: 27 %
MCH: 29.6 pg (ref 26.6–33.0)
MCHC: 34.2 g/dL (ref 31.5–35.7)
MCV: 87 fL (ref 79–97)
MONOS ABS: 0.7 10*3/uL (ref 0.1–0.9)
Monocytes: 9 %
NEUTROS ABS: 5 10*3/uL (ref 1.4–7.0)
Neutrophils: 60 %
PLATELETS: 306 10*3/uL (ref 150–450)
RBC: 4.63 x10E6/uL (ref 4.14–5.80)
RDW: 13.4 % (ref 12.3–15.4)
WBC: 8.2 10*3/uL (ref 3.4–10.8)

## 2017-10-02 LAB — COMPREHENSIVE METABOLIC PANEL
A/G RATIO: 2.3 — AB (ref 1.2–2.2)
ALK PHOS: 69 IU/L (ref 39–117)
ALT: 23 IU/L (ref 0–44)
AST: 18 IU/L (ref 0–40)
Albumin: 4.6 g/dL (ref 3.6–4.8)
BILIRUBIN TOTAL: 0.4 mg/dL (ref 0.0–1.2)
BUN / CREAT RATIO: 18 (ref 10–24)
BUN: 18 mg/dL (ref 8–27)
CO2: 23 mmol/L (ref 20–29)
Calcium: 9 mg/dL (ref 8.6–10.2)
Chloride: 103 mmol/L (ref 96–106)
Creatinine, Ser: 1 mg/dL (ref 0.76–1.27)
GFR calc Af Amer: 93 mL/min/{1.73_m2} (ref >59)
GFR calc non Af Amer: 81 mL/min/{1.73_m2} (ref >59)
GLOBULIN, TOTAL: 2 g/dL (ref 1.5–4.5)
Glucose: 90 mg/dL (ref 65–99)
POTASSIUM: 4.3 mmol/L (ref 3.5–5.2)
SODIUM: 140 mmol/L (ref 134–144)
TOTAL PROTEIN: 6.6 g/dL (ref 6.0–8.5)

## 2017-10-02 LAB — MICROSCOPIC EXAMINATION
CASTS: NONE SEEN /LPF
EPITHELIAL CELLS (NON RENAL): NONE SEEN /HPF (ref 0–10)

## 2017-10-02 LAB — UA/M W/RFLX CULTURE, ROUTINE
BILIRUBIN UA: NEGATIVE
GLUCOSE, UA: NEGATIVE
KETONES UA: NEGATIVE
LEUKOCYTES UA: NEGATIVE
Nitrite, UA: NEGATIVE
PROTEIN UA: NEGATIVE
RBC, UA: NEGATIVE
SPEC GRAV UA: 1.023 (ref 1.005–1.030)
UUROB: 0.2 mg/dL (ref 0.2–1.0)
pH, UA: 5 (ref 5.0–7.5)

## 2017-10-02 LAB — LIPID PANEL WITH LDL/HDL RATIO
Cholesterol, Total: 111 mg/dL (ref 100–199)
HDL: 41 mg/dL (ref >39)
LDL CALC: 61 mg/dL (ref 0–99)
LDL/HDL RATIO: 1.5 ratio (ref 0.0–3.6)
Triglycerides: 43 mg/dL (ref 0–149)
VLDL CHOLESTEROL CAL: 9 mg/dL (ref 5–40)

## 2017-10-02 LAB — TSH: TSH: 2.84 u[IU]/mL (ref 0.450–4.500)

## 2017-10-02 LAB — PSA: PROSTATE SPECIFIC AG, SERUM: 1.3 ng/mL (ref 0.0–4.0)

## 2017-10-02 LAB — HIV ANTIBODY (ROUTINE TESTING W REFLEX): HIV Screen 4th Generation wRfx: NONREACTIVE

## 2017-10-02 LAB — HEPATITIS C ANTIBODY (REFLEX): HCV Ab: <0.1 s/co ratio (ref 0.0–0.9)

## 2017-10-02 LAB — HCV COMMENT:

## 2017-10-02 LAB — T4, FREE: Free T4: 1.19 ng/dL (ref 0.82–1.77)

## 2017-10-12 ENCOUNTER — Other Ambulatory Visit: Payer: Self-pay

## 2017-10-12 MED ORDER — SUCRALFATE 1 G PO TABS: ORAL_TABLET | ORAL | 1 refills | Status: DC

## 2017-10-29 ENCOUNTER — Ambulatory Visit: Payer: BLUE CROSS/BLUE SHIELD | Admitting: Adult Health

## 2017-10-29 ENCOUNTER — Encounter: Payer: Self-pay | Admitting: Adult Health

## 2017-10-29 VITALS — BP 118/70 | HR 72 | Resp 16 | Ht 71.0 in | Wt 229.0 lb

## 2017-10-29 DIAGNOSIS — I10 Essential (primary) hypertension: Secondary | ICD-10-CM

## 2017-10-29 DIAGNOSIS — J449 Chronic obstructive pulmonary disease, unspecified: Secondary | ICD-10-CM | POA: Diagnosis not present

## 2017-10-29 DIAGNOSIS — E1165 Type 2 diabetes mellitus with hyperglycemia: Secondary | ICD-10-CM | POA: Diagnosis not present

## 2017-10-29 DIAGNOSIS — K219 Gastro-esophageal reflux disease without esophagitis: Secondary | ICD-10-CM

## 2017-10-29 DIAGNOSIS — E782 Mixed hyperlipidemia: Secondary | ICD-10-CM

## 2017-10-29 MED ORDER — CANAGLIFLOZIN 100 MG PO TABS: 100.0000 mg | ORAL_TABLET | Freq: Every day | ORAL | 2 refills | Status: DC

## 2017-10-29 NOTE — Patient Instructions (Signed)
Diabetes Mellitus and Nutrition When you have diabetes (diabetes mellitus), it is very important to have healthy eating habits because your blood sugar (glucose) levels are greatly affected by what you eat and drink. Eating healthy foods in the appropriate amounts, at about the same times every day, can help you:  Control your blood glucose.  Lower your risk of heart disease.  Improve your blood pressure.  Reach or maintain a healthy weight.  Every person with diabetes is different, and each person has different needs for a meal plan. Your health care provider may recommend that you work with a diet and nutrition specialist (dietitian) to make a meal plan that is best for you. Your meal plan may vary depending on factors such as:  The calories you need.  The medicines you take.  Your weight.  Your blood glucose, blood pressure, and cholesterol levels.  Your activity level.  Other health conditions you have, such as heart or kidney disease.  How do carbohydrates affect me? Carbohydrates affect your blood glucose level more than any other type of food. Eating carbohydrates naturally increases the amount of glucose in your blood. Carbohydrate counting is a method for keeping track of how many carbohydrates you eat. Counting carbohydrates is important to keep your blood glucose at a healthy level, especially if you use insulin or take certain oral diabetes medicines. It is important to know how many carbohydrates you can safely have in each meal. This is different for every person. Your dietitian can help you calculate how many carbohydrates you should have at each meal and for snack. Foods that contain carbohydrates include:  Bread, cereal, rice, pasta, and crackers.  Potatoes and corn.  Peas, beans, and lentils.  Milk and yogurt.  Fruit and juice.  Desserts, such as cakes, cookies, ice cream, and candy.  How does alcohol affect me? Alcohol can cause a sudden decrease in blood  glucose (hypoglycemia), especially if you use insulin or take certain oral diabetes medicines. Hypoglycemia can be a life-threatening condition. Symptoms of hypoglycemia (sleepiness, dizziness, and confusion) are similar to symptoms of having too much alcohol. If your health care provider says that alcohol is safe for you, follow these guidelines:  Limit alcohol intake to no more than 1 drink per day for nonpregnant women and 2 drinks per day for men. One drink equals 12 oz of beer, 5 oz of wine, or 1 oz of hard liquor.  Do not drink on an empty stomach.  Keep yourself hydrated with water, diet soda, or unsweetened iced tea.  Keep in mind that regular soda, juice, and other mixers may contain a lot of sugar and must be counted as carbohydrates.  What are tips for following this plan? Reading food labels  Start by checking the serving size on the label. The amount of calories, carbohydrates, fats, and other nutrients listed on the label are based on one serving of the food. Many foods contain more than one serving per package.  Check the total grams (g) of carbohydrates in one serving. You can calculate the number of servings of carbohydrates in one serving by dividing the total carbohydrates by 15. For example, if a food has 30 g of total carbohydrates, it would be equal to 2 servings of carbohydrates.  Check the number of grams (g) of saturated and trans fats in one serving. Choose foods that have low or no amount of these fats.  Check the number of milligrams (mg) of sodium in one serving. Most people   should limit total sodium intake to less than 2,300 mg per day.  Always check the nutrition information of foods labeled as "low-fat" or "nonfat". These foods may be higher in added sugar or refined carbohydrates and should be avoided.  Talk to your dietitian to identify your daily goals for nutrients listed on the label. Shopping  Avoid buying canned, premade, or processed foods. These  foods tend to be high in fat, sodium, and added sugar.  Shop around the outside edge of the grocery store. This includes fresh fruits and vegetables, bulk grains, fresh meats, and fresh dairy. Cooking  Use low-heat cooking methods, such as baking, instead of high-heat cooking methods like deep frying.  Cook using healthy oils, such as olive, canola, or sunflower oil.  Avoid cooking with butter, cream, or high-fat meats. Meal planning  Eat meals and snacks regularly, preferably at the same times every day. Avoid going long periods of time without eating.  Eat foods high in fiber, such as fresh fruits, vegetables, beans, and whole grains. Talk to your dietitian about how many servings of carbohydrates you can eat at each meal.  Eat 4-6 ounces of lean protein each day, such as lean meat, chicken, fish, eggs, or tofu. 1 ounce is equal to 1 ounce of meat, chicken, or fish, 1 egg, or 1/4 cup of tofu.  Eat some foods each day that contain healthy fats, such as avocado, nuts, seeds, and fish. Lifestyle   Check your blood glucose regularly.  Exercise at least 30 minutes 5 or more days each week, or as told by your health care provider.  Take medicines as told by your health care provider.  Do not use any products that contain nicotine or tobacco, such as cigarettes and e-cigarettes. If you need help quitting, ask your health care provider.  Work with a counselor or diabetes educator to identify strategies to manage stress and any emotional and social challenges. What are some questions to ask my health care provider?  Do I need to meet with a diabetes educator?  Do I need to meet with a dietitian?  What number can I call if I have questions?  When are the best times to check my blood glucose? Where to find more information:  American Diabetes Association: diabetes.org/food-and-fitness/food  Academy of Nutrition and Dietetics:  www.eatright.org/resources/health/diseases-and-conditions/diabetes  National Institute of Diabetes and Digestive and Kidney Diseases (NIH): www.niddk.nih.gov/health-information/diabetes/overview/diet-eating-physical-activity Summary  A healthy meal plan will help you control your blood glucose and maintain a healthy lifestyle.  Working with a diet and nutrition specialist (dietitian) can help you make a meal plan that is best for you.  Keep in mind that carbohydrates and alcohol have immediate effects on your blood glucose levels. It is important to count carbohydrates and to use alcohol carefully. This information is not intended to replace advice given to you by your health care provider. Make sure you discuss any questions you have with your health care provider. Document Released: 10/06/2004 Document Revised: 02/14/2016 Document Reviewed: 02/14/2016 Elsevier Interactive Patient Education  2018 Elsevier Inc.  

## 2017-10-29 NOTE — Progress Notes (Signed)
[facility] [place], [place] [address]  Internal MEDICINE  Office Visit Note  Patient Name: Ricardo Durham  [id number]  [id number]  Date of Service: 10/29/2017  Chief Complaint  Patient presents with  . Diabetes    Diabetes  He presents for his follow-up diabetic visit. He has type 2 diabetes mellitus. The initial diagnosis of diabetes was made 10 years ago. His disease course has been improving. There are no hypoglycemic associated symptoms. Pertinent negatives for hypoglycemia include no nervousness/anxiousness or tremors. Pertinent negatives for diabetes include no chest pain and no fatigue. There are no hypoglycemic complications. Symptoms are stable.   Current Medication: Outpatient Encounter Medications as of 10/29/2017  Medication Sig  . ALOE VERA MOISTURIZING EX Apply topically.  Marland Kitchen aspirin 81 MG tablet Take 81 mg by mouth every other day.  Marland Kitchen atorvastatin (LIPITOR) 80 MG tablet Take 1 tablet (80 mg total) by mouth daily.  Marland Kitchen glucose blood (ACCU-CHEK AVIVA PLUS) test strip Use once daily for sugar e11.65  . isosorbide mononitrate (IMDUR) 30 MG 24 hr tablet Take 30 mg by mouth daily.  Marland Kitchen lisinopril (PRINIVIL,ZESTRIL) 5 MG tablet Take 5 mg by mouth daily.  . pantoprazole (PROTONIX) 40 MG tablet Take 1 tablet (40 mg total) by mouth 2 (two) times daily.  . sitaGLIPtin-metformin (JANUMET) 50-1000 MG tablet Take 1 tablet by mouth 2 (two) times daily with a meal.  . sucralfate (CARAFATE) 1 g tablet Take one tab by mouth three times a day prior to meals  . [DISCONTINUED] glyBURIDE (DIABETA) 5 MG tablet Take 2 tab by twice  daily  . canagliflozin (INVOKANA) 100 MG TABS tablet Take 1 tablet (100 mg total) by mouth daily before breakfast.   No facility-administered encounter medications on file as of 10/29/2017.     Surgical History: Past Surgical History:  Procedure Laterality Date  . CARDIAC CATHETERIZATION Left 08/24/2015   Procedure: Left Heart Cath and  Coronary Angiography;  Surgeon: Corey Skains, MD;  Location: [facility];  Service: Cardiovascular;  Laterality: Left;  . COLONOSCOPY WITH PROPOFOL N/A 10/26/2014   Procedure: COLONOSCOPY WITH PROPOFOL;  Surgeon: Lollie Sails, MD;  Location: [facility] ENDOSCOPY;  Service: Endoscopy;  Laterality: N/A;  . ESOPHAGOGASTRODUODENOSCOPY (EGD) WITH PROPOFOL N/A 10/26/2014   Procedure: ESOPHAGOGASTRODUODENOSCOPY (EGD) WITH PROPOFOL;  Surgeon: Lollie Sails, MD;  Location: [facility] ENDOSCOPY;  Service: Endoscopy;  Laterality: N/A;  . NECK SURGERY     c 6-7 due to stable fracture  . NISSEN FUNDOPLICATION    . UMBILICAL HERNIA REPAIR  2001    Medical History: Past Medical History:  Diagnosis Date  . COPD (chronic obstructive pulmonary disease) ([place])   . Diabetes mellitus without complication ([place])     Family History: Family History  Problem Relation Age of Onset  . Diabetes Father     Social History   Socioeconomic History  . Marital status: Divorced    Spouse name: Not on file  . Number of children: Not on file  . Years of education: Not on file  . Highest education level: Not on file  Occupational History  . Not on file  Social Needs  . Financial resource strain: Not on file  . Food insecurity:    Worry: Not on file    Inability: Not on file  . Transportation needs:    Medical: Not on file    Non-medical: Not on file  Tobacco Use  . Smoking status: Former Smoker  Types: Cigarettes  . Smokeless tobacco: Never Used  Substance and Sexual Activity  . Alcohol use: Yes    Alcohol/week: 1.0 standard drinks    Types: 1 Cans of beer per week    Comment: occasionally  . Drug use: No  . Sexual activity: Not on file  Lifestyle  . Physical activity:    Days per week: Not on file    Minutes per session: Not on file  . Stress: Not on file  Relationships  . Social connections:    Talks on phone: Not on file    Gets together: Not on file    Attends religious service:  Not on file    Active member of club or organization: Not on file    Attends meetings of clubs or organizations: Not on file    Relationship status: Not on file  . Intimate partner violence:    Fear of current or ex partner: Not on file    Emotionally abused: Not on file    Physically abused: Not on file    Forced sexual activity: Not on file  Other Topics Concern  . Not on file  Social History Narrative  . Not on file      Review of Systems  Constitutional: Negative.  Negative for chills, fatigue and unexpected weight change.  HENT: Negative.  Negative for congestion, rhinorrhea, sneezing and sore throat.   Eyes: Negative for redness.  Respiratory: Negative.  Negative for cough, chest tightness and shortness of breath.   Cardiovascular: Negative.  Negative for chest pain and palpitations.  Gastrointestinal: Negative.  Negative for abdominal pain, constipation, diarrhea, nausea and vomiting.  Endocrine: Negative.   Genitourinary: Negative.  Negative for dysuria and frequency.  Musculoskeletal: Negative.  Negative for arthralgias, back pain, joint swelling and neck pain.  Skin: Negative.  Negative for rash.  Allergic/Immunologic: Negative.   Neurological: Negative.  Negative for tremors and numbness.  Hematological: Negative for adenopathy. Does not bruise/bleed easily.  Psychiatric/Behavioral: Negative.  Negative for behavioral problems, sleep disturbance and suicidal ideas. The patient is not nervous/anxious.     Vital Signs: BP 118/70   Pulse 72   Resp 16   Ht 5\' 11"  (1.803 m)   Wt 229 lb (103.9 kg)   SpO2 96%   BMI 31.94 kg/m    Physical Exam  Constitutional: He is oriented to person, place, and time. He appears well-developed and well-nourished. No distress.  HENT:  Head: Normocephalic and atraumatic.  Mouth/Throat: Oropharynx is clear and moist. No oropharyngeal exudate.  Eyes: Pupils are equal, round, and reactive to light. EOM are normal.  Neck: Normal range  of motion. Neck supple. No JVD present. No tracheal deviation present. No thyromegaly present.  Cardiovascular: Normal rate, regular rhythm and normal heart sounds. Exam reveals no gallop and no friction rub.  No murmur heard. Pulmonary/Chest: Effort normal and breath sounds normal. No respiratory distress. He has no wheezes. He has no rales. He exhibits no tenderness.  Abdominal: Soft. There is no tenderness. There is no guarding.  Musculoskeletal: Normal range of motion.  Lymphadenopathy:    He has no cervical adenopathy.  Neurological: He is alert and oriented to person, place, and time. No cranial nerve deficit.  Skin: Skin is warm and dry. He is not diaphoretic.  Psychiatric: He has a normal mood and affect. His behavior is normal. Judgment and thought content normal.  Nursing note and vitals reviewed.  Assessment/Plan: 1. Uncontrolled type 2 diabetes mellitus with hyperglycemia ([place]) Stop  Glyburide, Start Invokana as directed.  - canagliflozin (INVOKANA) 100 MG TABS tablet; Take 1 tablet (100 mg total) by mouth daily before breakfast.  Dispense: 30 tablet; Refill: 2  2. Chronic obstructive pulmonary disease, unspecified COPD type ([place]) Continue current therapy.  Pt doing well at this time.   3. Gastroesophageal reflux disease without esophagitis Pt denies current issues.  Continue current therapy.   4. Essential hypertension Stable at this time, continue current therapy.   5. Mixed hyperlipidemia Lipid panel stable.    General Counseling: Ricardo Durham understanding of the findings of todays visit and agrees with plan of treatment. I have discussed any further diagnostic evaluation that may be needed or ordered today. We also reviewed his medications today. he has been encouraged to call the office with any questions or concerns that should arise related to todays visit.    No orders of the defined types were placed in this encounter.   Meds ordered this encounter   Medications  . canagliflozin (INVOKANA) 100 MG TABS tablet    Sig: Take 1 tablet (100 mg total) by mouth daily before breakfast.    Dispense:  30 tablet    Refill:  2    Time spent: 25 Minutes   This patient was seen by [name] AGNP-C in Collaboration with Dr Lavera Guise as a part of collaborative care agreement    [name] [facility] Internal medicine

## 2017-11-08 ENCOUNTER — Other Ambulatory Visit: Payer: Self-pay

## 2017-11-08 MED ORDER — SUCRALFATE 1 G PO TABS: ORAL_TABLET | ORAL | 1 refills | Status: DC

## 2017-11-23 DIAGNOSIS — L739 Follicular disorder, unspecified: Secondary | ICD-10-CM | POA: Diagnosis not present

## 2017-11-23 DIAGNOSIS — B019 Varicella without complication: Secondary | ICD-10-CM | POA: Diagnosis not present

## 2017-11-23 DIAGNOSIS — B029 Zoster without complications: Secondary | ICD-10-CM | POA: Diagnosis not present

## 2017-12-31 ENCOUNTER — Ambulatory Visit: Payer: BLUE CROSS/BLUE SHIELD | Admitting: Adult Health

## 2017-12-31 ENCOUNTER — Encounter: Payer: Self-pay | Admitting: Adult Health

## 2017-12-31 VITALS — BP 122/74 | HR 78 | Resp 16 | Ht 71.0 in | Wt 228.0 lb

## 2017-12-31 DIAGNOSIS — E1165 Type 2 diabetes mellitus with hyperglycemia: Secondary | ICD-10-CM

## 2017-12-31 DIAGNOSIS — N3289 Other specified disorders of bladder: Secondary | ICD-10-CM | POA: Diagnosis not present

## 2017-12-31 DIAGNOSIS — R300 Dysuria: Secondary | ICD-10-CM

## 2017-12-31 DIAGNOSIS — K219 Gastro-esophageal reflux disease without esophagitis: Secondary | ICD-10-CM | POA: Diagnosis not present

## 2017-12-31 DIAGNOSIS — I10 Essential (primary) hypertension: Secondary | ICD-10-CM | POA: Diagnosis not present

## 2017-12-31 LAB — POCT URINALYSIS DIPSTICK
Bilirubin, UA: NEGATIVE
Blood, UA: NEGATIVE
Glucose, UA: NEGATIVE
KETONES UA: NEGATIVE
Leukocytes, UA: NEGATIVE
Nitrite, UA: NEGATIVE
PH UA: 6 (ref 5.0–8.0)
Protein, UA: NEGATIVE
Spec Grav, UA: 1.01 (ref 1.010–1.025)
Urobilinogen, UA: 0.2 E.U./dL

## 2017-12-31 LAB — POCT GLYCOSYLATED HEMOGLOBIN (HGB A1C): Hemoglobin A1C: 10.6 % — AB (ref 4.0–5.6)

## 2017-12-31 MED ORDER — PHENAZOPYRIDINE HCL 200 MG PO TABS: 200.0000 mg | ORAL_TABLET | Freq: Three times a day (TID) | ORAL | 0 refills | Status: DC | PRN

## 2017-12-31 MED ORDER — CANAGLIFLOZIN 100 MG PO TABS: 100.0000 mg | ORAL_TABLET | Freq: Every day | ORAL | 2 refills | Status: DC

## 2017-12-31 NOTE — Progress Notes (Signed)
[facility] [place], [place] [address]  Internal MEDICINE  Office Visit Note  Patient Name: Ricardo Durham  [id number]  [id number]  Date of Service: 12/31/2017  Chief Complaint  Patient presents with  . Diabetes  . Herpes Zoster    stopped invokana and went back to janumet because the prednisone shot up his sugars   . Quality Metric Gaps    foot exam     HPI Pt is here for follow up on Diabetes.  Since last the patient has been diagnosed with herpes zoster and placed on prednisone taper as well as Valtrex.  He reports having completed both medications.  He reports that once he started taking the prednisone his blood sugars daily were increasing.  He felt like the best thing to do was to stop taking his Invokana and restart his Janumet.  Patient is also in need of a diabetic foot exam to close his quality metric gaps which she will have at this visit.  Patient also complaining of waking up multiple times a night to urinate.  He reports that he feels like his bladder is never completely emptied.  He states he could get up every 10 minutes and goes make some urine.  Urine dip today in the office does not show evidence of infection.    Current Medication: Outpatient Encounter Medications as of 12/31/2017  Medication Sig  . ALOE VERA MOISTURIZING EX Apply topically.  Marland Kitchen aspirin 81 MG tablet Take 81 mg by mouth every other day.  Marland Kitchen atorvastatin (LIPITOR) 80 MG tablet Take 1 tablet (80 mg total) by mouth daily.  Marland Kitchen glucose blood (ACCU-CHEK AVIVA PLUS) test strip Use once daily for sugar e11.65  . isosorbide mononitrate (IMDUR) 30 MG 24 hr tablet Take 30 mg by mouth daily.  Marland Kitchen lisinopril (PRINIVIL,ZESTRIL) 5 MG tablet Take 5 mg by mouth daily.  . pantoprazole (PROTONIX) 40 MG tablet Take 1 tablet (40 mg total) by mouth 2 (two) times daily.  . sitaGLIPtin-metformin (JANUMET) 50-1000 MG tablet Take 1 tablet by mouth 2 (two) times daily with a meal.  . sucralfate  (CARAFATE) 1 g tablet Take one tab by mouth three times a day prior to meals  . canagliflozin (INVOKANA) 100 MG TABS tablet Take 1 tablet (100 mg total) by mouth daily before breakfast.  . phenazopyridine (PYRIDIUM) 200 MG tablet Take 1 tablet (200 mg total) by mouth 3 (three) times daily as needed for pain.  . [DISCONTINUED] canagliflozin (INVOKANA) 100 MG TABS tablet Take 1 tablet (100 mg total) by mouth daily before breakfast. (Patient not taking: Reported on 12/31/2017)   No facility-administered encounter medications on file as of 12/31/2017.     Surgical History: Past Surgical History:  Procedure Laterality Date  . CARDIAC CATHETERIZATION Left 08/24/2015   Procedure: Left Heart Cath and Coronary Angiography;  Surgeon: Corey Skains, MD;  Location: [facility];  Service: Cardiovascular;  Laterality: Left;  . COLONOSCOPY WITH PROPOFOL N/A 10/26/2014   Procedure: COLONOSCOPY WITH PROPOFOL;  Surgeon: Lollie Sails, MD;  Location: [facility] ENDOSCOPY;  Service: Endoscopy;  Laterality: N/A;  . ESOPHAGOGASTRODUODENOSCOPY (EGD) WITH PROPOFOL N/A 10/26/2014   Procedure: ESOPHAGOGASTRODUODENOSCOPY (EGD) WITH PROPOFOL;  Surgeon: Lollie Sails, MD;  Location: [facility] ENDOSCOPY;  Service: Endoscopy;  Laterality: N/A;  . NECK SURGERY     c 6-7 due to stable fracture  . NISSEN FUNDOPLICATION    . UMBILICAL HERNIA REPAIR  2001    Medical History: Past Medical  History:  Diagnosis Date  . COPD (chronic obstructive pulmonary disease) ([place])   . Diabetes mellitus without complication ([place])     Family History: Family History  Problem Relation Age of Onset  . Diabetes Father     Social History   Socioeconomic History  . Marital status: Divorced    Spouse name: Not on file  . Number of children: Not on file  . Years of education: Not on file  . Highest education level: Not on file  Occupational History  . Not on file  Social Needs  . Financial resource strain: Not on file  . Food  insecurity:    Worry: Not on file    Inability: Not on file  . Transportation needs:    Medical: Not on file    Non-medical: Not on file  Tobacco Use  . Smoking status: Former Smoker    Types: Cigarettes  . Smokeless tobacco: Never Used  Substance and Sexual Activity  . Alcohol use: Yes    Alcohol/week: 1.0 standard drinks    Types: 1 Cans of beer per week    Comment: occasionally  . Drug use: No  . Sexual activity: Not on file  Lifestyle  . Physical activity:    Days per week: Not on file    Minutes per session: Not on file  . Stress: Not on file  Relationships  . Social connections:    Talks on phone: Not on file    Gets together: Not on file    Attends religious service: Not on file    Active member of club or organization: Not on file    Attends meetings of clubs or organizations: Not on file    Relationship status: Not on file  . Intimate partner violence:    Fear of current or ex partner: Not on file    Emotionally abused: Not on file    Physically abused: Not on file    Forced sexual activity: Not on file  Other Topics Concern  . Not on file  Social History Narrative  . Not on file      Review of Systems  Constitutional: Negative.  Negative for chills, fatigue and unexpected weight change.  HENT: Negative.  Negative for congestion, rhinorrhea, sneezing and sore throat.   Eyes: Negative for redness.  Respiratory: Negative.  Negative for cough, chest tightness and shortness of breath.   Cardiovascular: Negative.  Negative for chest pain and palpitations.  Gastrointestinal: Negative.  Negative for abdominal pain, constipation, diarrhea, nausea and vomiting.  Endocrine: Negative.   Genitourinary: Negative.  Negative for dysuria and frequency.  Musculoskeletal: Negative.  Negative for arthralgias, back pain, joint swelling and neck pain.  Skin: Negative.  Negative for rash.  Allergic/Immunologic: Negative.   Neurological: Negative.  Negative for tremors and  numbness.  Hematological: Negative for adenopathy. Does not bruise/bleed easily.  Psychiatric/Behavioral: Negative.  Negative for behavioral problems, sleep disturbance and suicidal ideas. The patient is not nervous/anxious.     Vital Signs: BP 122/74 (BP Location: Left Arm, Patient Position: Sitting, Cuff Size: Normal)   Pulse 78   Resp 16   Ht 5\' 11"  (1.803 m)   Wt 228 lb (103.4 kg)   SpO2 96%   BMI 31.80 kg/m    Physical Exam  Constitutional: He is oriented to person, place, and time. He appears well-developed and well-nourished. No distress.  HENT:  Head: Normocephalic and atraumatic.  Mouth/Throat: Oropharynx is clear and moist. No oropharyngeal exudate.  Eyes: Pupils  are equal, round, and reactive to light. EOM are normal.  Neck: Normal range of motion. Neck supple. No JVD present. No tracheal deviation present. No thyromegaly present.  Cardiovascular: Normal rate, regular rhythm and normal heart sounds. Exam reveals no gallop and no friction rub.  No murmur heard. Pulmonary/Chest: Effort normal and breath sounds normal. No respiratory distress. He has no wheezes. He has no rales. He exhibits no tenderness.  Abdominal: Soft. There is no tenderness. There is no guarding.  Musculoskeletal: Normal range of motion.  Lymphadenopathy:    He has no cervical adenopathy.  Neurological: He is alert and oriented to person, place, and time. No cranial nerve deficit.  Skin: Skin is warm and dry. He is not diaphoretic.  Psychiatric: He has a normal mood and affect. His behavior is normal. Judgment and thought content normal.  Nursing note and vitals reviewed.  Assessment/Plan: 1. Uncontrolled type 2 diabetes mellitus with hyperglycemia (HCC) Hemoglobin A1c increased from 8.3-10.6 at this visit.  Encourage patient to restart his him, and stop his Janumet.  Refill of Invokana process for patient at this time. - POCT HgB A1C - canagliflozin (INVOKANA) 100 MG TABS tablet; Take 1 tablet  (100 mg total) by mouth daily before breakfast.  Dispense: 30 tablet; Refill: 2  2. Bladder spasm We will try a course of Pyridium 200 mg p.o. 3 times daily for 3 days.  To see if this improves patient's nocturnal urination and possible bladder spasms.  Patient is instructed to let us know if symptoms persist.  3. Essential hypertension Stable, continue current medications as prescribed  4. Gastroesophageal reflux disease without esophagitis Stable, continue current medication therapy  5. Dysuria Urine dip negative at this time. - POCT Urinalysis Dipstick  General Counseling: Roylee verbalizes understanding of the findings of todays visit and agrees with plan of treatment. I have discussed any further diagnostic evaluation that may be needed or ordered today. We also reviewed his medications today. he has been encouraged to call the office with any questions or concerns that should arise related to todays visit.    Orders Placed This Encounter  Procedures  . POCT HgB A1C  . POCT Urinalysis Dipstick    Meds ordered this encounter  Medications  . phenazopyridine (PYRIDIUM) 200 MG tablet    Sig: Take 1 tablet (200 mg total) by mouth 3 (three) times daily as needed for pain.    Dispense:  10 tablet    Refill:  0  . canagliflozin (INVOKANA) 100 MG TABS tablet    Sig: Take 1 tablet (100 mg total) by mouth daily before breakfast.    Dispense:  30 tablet    Refill:  2    Time spent: 25 Minutes   This patient was seen by [name] AGNP-C in Collaboration with Dr Lavera Guise as a part of collaborative care agreement     [name] AGNP-C Internal medicine

## 2017-12-31 NOTE — Patient Instructions (Signed)
Diabetes Mellitus and Nutrition When you have diabetes (diabetes mellitus), it is very important to have healthy eating habits because your blood sugar (glucose) levels are greatly affected by what you eat and drink. Eating healthy foods in the appropriate amounts, at about the same times every day, can help you:  Control your blood glucose.  Lower your risk of heart disease.  Improve your blood pressure.  Reach or maintain a healthy weight.  Every person with diabetes is different, and each person has different needs for a meal plan. Your health care provider may recommend that you work with a diet and nutrition specialist (dietitian) to make a meal plan that is best for you. Your meal plan may vary depending on factors such as:  The calories you need.  The medicines you take.  Your weight.  Your blood glucose, blood pressure, and cholesterol levels.  Your activity level.  Other health conditions you have, such as heart or kidney disease.  How do carbohydrates affect me? Carbohydrates affect your blood glucose level more than any other type of food. Eating carbohydrates naturally increases the amount of glucose in your blood. Carbohydrate counting is a method for keeping track of how many carbohydrates you eat. Counting carbohydrates is important to keep your blood glucose at a healthy level, especially if you use insulin or take certain oral diabetes medicines. It is important to know how many carbohydrates you can safely have in each meal. This is different for every person. Your dietitian can help you calculate how many carbohydrates you should have at each meal and for snack. Foods that contain carbohydrates include:  Bread, cereal, rice, pasta, and crackers.  Potatoes and corn.  Peas, beans, and lentils.  Milk and yogurt.  Fruit and juice.  Desserts, such as cakes, cookies, ice cream, and candy.  How does alcohol affect me? Alcohol can cause a sudden decrease in blood  glucose (hypoglycemia), especially if you use insulin or take certain oral diabetes medicines. Hypoglycemia can be a life-threatening condition. Symptoms of hypoglycemia (sleepiness, dizziness, and confusion) are similar to symptoms of having too much alcohol. If your health care provider says that alcohol is safe for you, follow these guidelines:  Limit alcohol intake to no more than 1 drink per day for nonpregnant women and 2 drinks per day for men. One drink equals 12 oz of beer, 5 oz of wine, or 1 oz of hard liquor.  Do not drink on an empty stomach.  Keep yourself hydrated with water, diet soda, or unsweetened iced tea.  Keep in mind that regular soda, juice, and other mixers may contain a lot of sugar and must be counted as carbohydrates.  What are tips for following this plan? Reading food labels  Start by checking the serving size on the label. The amount of calories, carbohydrates, fats, and other nutrients listed on the label are based on one serving of the food. Many foods contain more than one serving per package.  Check the total grams (g) of carbohydrates in one serving. You can calculate the number of servings of carbohydrates in one serving by dividing the total carbohydrates by 15. For example, if a food has 30 g of total carbohydrates, it would be equal to 2 servings of carbohydrates.  Check the number of grams (g) of saturated and trans fats in one serving. Choose foods that have low or no amount of these fats.  Check the number of milligrams (mg) of sodium in one serving. Most people   should limit total sodium intake to less than 2,300 mg per day.  Always check the nutrition information of foods labeled as "low-fat" or "nonfat". These foods may be higher in added sugar or refined carbohydrates and should be avoided.  Talk to your dietitian to identify your daily goals for nutrients listed on the label. Shopping  Avoid buying canned, premade, or processed foods. These  foods tend to be high in fat, sodium, and added sugar.  Shop around the outside edge of the grocery store. This includes fresh fruits and vegetables, bulk grains, fresh meats, and fresh dairy. Cooking  Use low-heat cooking methods, such as baking, instead of high-heat cooking methods like deep frying.  Cook using healthy oils, such as olive, canola, or sunflower oil.  Avoid cooking with butter, cream, or high-fat meats. Meal planning  Eat meals and snacks regularly, preferably at the same times every day. Avoid going long periods of time without eating.  Eat foods high in fiber, such as fresh fruits, vegetables, beans, and whole grains. Talk to your dietitian about how many servings of carbohydrates you can eat at each meal.  Eat 4-6 ounces of lean protein each day, such as lean meat, chicken, fish, eggs, or tofu. 1 ounce is equal to 1 ounce of meat, chicken, or fish, 1 egg, or 1/4 cup of tofu.  Eat some foods each day that contain healthy fats, such as avocado, nuts, seeds, and fish. Lifestyle   Check your blood glucose regularly.  Exercise at least 30 minutes 5 or more days each week, or as told by your health care provider.  Take medicines as told by your health care provider.  Do not use any products that contain nicotine or tobacco, such as cigarettes and e-cigarettes. If you need help quitting, ask your health care provider.  Work with a counselor or diabetes educator to identify strategies to manage stress and any emotional and social challenges. What are some questions to ask my health care provider?  Do I need to meet with a diabetes educator?  Do I need to meet with a dietitian?  What number can I call if I have questions?  When are the best times to check my blood glucose? Where to find more information:  American Diabetes Association: diabetes.org/food-and-fitness/food  Academy of Nutrition and Dietetics:  www.eatright.org/resources/health/diseases-and-conditions/diabetes  National Institute of Diabetes and Digestive and Kidney Diseases (NIH): www.niddk.nih.gov/health-information/diabetes/overview/diet-eating-physical-activity Summary  A healthy meal plan will help you control your blood glucose and maintain a healthy lifestyle.  Working with a diet and nutrition specialist (dietitian) can help you make a meal plan that is best for you.  Keep in mind that carbohydrates and alcohol have immediate effects on your blood glucose levels. It is important to count carbohydrates and to use alcohol carefully. This information is not intended to replace advice given to you by your health care provider. Make sure you discuss any questions you have with your health care provider. Document Released: 10/06/2004 Document Revised: 02/14/2016 Document Reviewed: 02/14/2016 Elsevier Interactive Patient Education  2018 Elsevier Inc.  

## 2018-01-09 ENCOUNTER — Other Ambulatory Visit: Payer: Self-pay

## 2018-01-09 MED ORDER — PANTOPRAZOLE SODIUM 40 MG PO TBEC: 40.0000 mg | DELAYED_RELEASE_TABLET | Freq: Two times a day (BID) | ORAL | 3 refills | Status: DC

## 2018-01-25 DIAGNOSIS — D2262 Melanocytic nevi of left upper limb, including shoulder: Secondary | ICD-10-CM | POA: Diagnosis not present

## 2018-01-25 DIAGNOSIS — D485 Neoplasm of uncertain behavior of skin: Secondary | ICD-10-CM | POA: Diagnosis not present

## 2018-01-25 DIAGNOSIS — C44519 Basal cell carcinoma of skin of other part of trunk: Secondary | ICD-10-CM | POA: Diagnosis not present

## 2018-01-25 DIAGNOSIS — D2261 Melanocytic nevi of right upper limb, including shoulder: Secondary | ICD-10-CM | POA: Diagnosis not present

## 2018-01-25 DIAGNOSIS — L821 Other seborrheic keratosis: Secondary | ICD-10-CM | POA: Diagnosis not present

## 2018-01-25 DIAGNOSIS — L570 Actinic keratosis: Secondary | ICD-10-CM | POA: Diagnosis not present

## 2018-01-25 DIAGNOSIS — C44612 Basal cell carcinoma of skin of right upper limb, including shoulder: Secondary | ICD-10-CM | POA: Diagnosis not present

## 2018-01-25 DIAGNOSIS — X32XXXA Exposure to sunlight, initial encounter: Secondary | ICD-10-CM | POA: Diagnosis not present

## 2018-01-25 DIAGNOSIS — D225 Melanocytic nevi of trunk: Secondary | ICD-10-CM | POA: Diagnosis not present

## 2018-02-16 ENCOUNTER — Other Ambulatory Visit: Payer: Self-pay | Admitting: Internal Medicine

## 2018-03-01 ENCOUNTER — Telehealth: Payer: Self-pay

## 2018-03-01 ENCOUNTER — Encounter: Payer: Self-pay | Admitting: Adult Health

## 2018-03-01 ENCOUNTER — Ambulatory Visit: Payer: BC Managed Care – PPO | Admitting: Adult Health

## 2018-03-01 VITALS — BP 132/98 | HR 87 | Temp 96.9°F | Resp 16 | Ht 71.0 in | Wt 222.0 lb

## 2018-03-01 DIAGNOSIS — R6889 Other general symptoms and signs: Secondary | ICD-10-CM | POA: Diagnosis not present

## 2018-03-01 DIAGNOSIS — J069 Acute upper respiratory infection, unspecified: Secondary | ICD-10-CM

## 2018-03-01 DIAGNOSIS — I10 Essential (primary) hypertension: Secondary | ICD-10-CM

## 2018-03-01 LAB — POCT INFLUENZA A/B
INFLUENZA A, POC: NEGATIVE
Influenza B, POC: NEGATIVE

## 2018-03-01 MED ORDER — SULFAMETHOXAZOLE-TRIMETHOPRIM 800-160 MG PO TABS: 1.0000 | ORAL_TABLET | Freq: Two times a day (BID) | ORAL | 0 refills | Status: DC

## 2018-03-01 NOTE — Progress Notes (Signed)
[facility] [place], [place] [address]  Internal MEDICINE  Office Visit Note  Patient Name: Ricardo Durham  [id number]  [id number]  Date of Service: 04/24/2018  Chief Complaint  Patient presents with  . Cough  . Sinusitis     HPI Pt is here for a sick visit.  Patient reports approximately 1 week of sinus pain and pressure with headaches.  He is also been coughing and having postnasal drainage.  Denies any fever or shortness of breath at this time.  He is trying over-the-counter medications with no relief.      Current Medication:  Outpatient Encounter Medications as of 03/01/2018  Medication Sig  . ALOE VERA MOISTURIZING EX Apply topically.  Marland Kitchen aspirin 81 MG tablet Take 81 mg by mouth every other day.  Marland Kitchen atorvastatin (LIPITOR) 80 MG tablet Take 1 tablet (80 mg total) by mouth daily.  . canagliflozin (INVOKANA) 100 MG TABS tablet Take 1 tablet (100 mg total) by mouth daily before breakfast.  . glucose blood (ACCU-CHEK AVIVA PLUS) test strip Use once daily for sugar e11.65  . isosorbide mononitrate (IMDUR) 30 MG 24 hr tablet Take 30 mg by mouth daily.  Marland Kitchen JANUMET 50-1000 MG tablet TAKE 1 TABLET BY MOUTH TWICE A DAY  . lisinopril (PRINIVIL,ZESTRIL) 5 MG tablet Take 5 mg by mouth daily.  . pantoprazole (PROTONIX) 40 MG tablet Take 1 tablet (40 mg total) by mouth 2 (two) times daily.  . [DISCONTINUED] phenazopyridine (PYRIDIUM) 200 MG tablet Take 1 tablet (200 mg total) by mouth 3 (three) times daily as needed for pain.  . [DISCONTINUED] sucralfate (CARAFATE) 1 g tablet Take one tab by mouth three times a day prior to meals  . [DISCONTINUED] sulfamethoxazole-trimethoprim (BACTRIM DS,SEPTRA DS) 800-160 MG tablet Take 1 tablet by mouth 2 (two) times daily. (Patient not taking: Reported on 04/01/2018)   No facility-administered encounter medications on file as of 03/01/2018.       Medical History: Past Medical History:  Diagnosis Date  . COPD (chronic  obstructive pulmonary disease) ([place])   . Diabetes mellitus without complication (HCC)      Vital Signs: BP (!) 132/98   Pulse 87   Temp (!) 96.9 F (36.1 C)   Resp 16   Ht 5\' 11"  (1.803 m)   Wt 222 lb (100.7 kg)   SpO2 95%   BMI 30.96 kg/m    Review of Systems  Constitutional: Negative.  Negative for chills, fatigue and unexpected weight change.  HENT: Positive for postnasal drip, sinus pressure, sinus pain and sore throat. Negative for congestion, rhinorrhea and sneezing.   Eyes: Negative for redness.  Respiratory: Positive for cough. Negative for chest tightness and shortness of breath.   Cardiovascular: Negative.  Negative for chest pain and palpitations.  Gastrointestinal: Negative.  Negative for abdominal pain, constipation, diarrhea, nausea and vomiting.  Endocrine: Negative.   Genitourinary: Negative.  Negative for dysuria and frequency.  Musculoskeletal: Negative.  Negative for arthralgias, back pain, joint swelling and neck pain.  Skin: Negative.  Negative for rash.  Allergic/Immunologic: Negative.   Neurological: Negative.  Negative for tremors and numbness.  Hematological: Negative for adenopathy. Does not bruise/bleed easily.  Psychiatric/Behavioral: Negative.  Negative for behavioral problems, sleep disturbance and suicidal ideas. The patient is not nervous/anxious.     Physical Exam Vitals signs and nursing note reviewed.  Constitutional:      General: He is not in acute distress.    Appearance: He is well-developed. He is  not diaphoretic.  HENT:     Head: Normocephalic and atraumatic.     Mouth/Throat:     Pharynx: No oropharyngeal exudate.  Eyes:     Pupils: Pupils are equal, round, and reactive to light.  Neck:     Musculoskeletal: Normal range of motion and neck supple.     Thyroid: No thyromegaly.     Vascular: No JVD.     Trachea: No tracheal deviation.  Cardiovascular:     Rate and Rhythm: Normal rate and regular rhythm.     Heart sounds:  Normal heart sounds. No murmur. No friction rub. No gallop.   Pulmonary:     Effort: Pulmonary effort is normal. No respiratory distress.     Breath sounds: Normal breath sounds. No wheezing or rales.  Chest:     Chest wall: No tenderness.  Abdominal:     Palpations: Abdomen is soft.     Tenderness: There is no abdominal tenderness. There is no guarding.  Musculoskeletal: Normal range of motion.  Lymphadenopathy:     Cervical: No cervical adenopathy.  Skin:    General: Skin is warm and dry.  Neurological:     Mental Status: He is alert and oriented to person, place, and time.     Cranial Nerves: No cranial nerve deficit.  Psychiatric:        Behavior: Behavior normal.        Thought Content: Thought content normal.        Judgment: Judgment normal.    Assessment/Plan: 1. Upper respiratory tract infection, unspecified type Advised patient to take entire course of antibiotics as prescribed with food. Pt should return to clinic in 7-10 days if symptoms fail to improve or new symptoms develop.  Bactrim DS 800/160mg  po bid x 10 days.  2. Flu-like symptoms Influenza negative at this time.  - POCT Influenza A/B  3. Essential hypertension Diastolic BP elevated today 132/98.  PT reports he has been taking OTC decongestants which have likely increased his blood pressure.  Educated patient on coricidin brand products.  Will continue to monitor at future visits.    General Counseling: joquan lotz understanding of the findings of todays visit and agrees with plan of treatment. I have discussed any further diagnostic evaluation that may be needed or ordered today. We also reviewed his medications today. he has been encouraged to call the office with any questions or concerns that should arise related to todays visit.   Orders Placed This Encounter  Procedures  . POCT Influenza A/B    Meds ordered this encounter  Medications  . DISCONTD: sulfamethoxazole-trimethoprim (BACTRIM  DS,SEPTRA DS) 800-160 MG tablet    Sig: Take 1 tablet by mouth 2 (two) times daily.    Dispense:  20 tablet    Refill:  0    Time spent: 25 Minutes  This patient was seen by [name] AGNP-C in Collaboration with Dr Lavera Guise as a part of collaborative care agreement.  [name] AGNP-C Internal Medicine

## 2018-03-01 NOTE — Telephone Encounter (Signed)
Pt lmom that  had chest cold try to call back voice mail is full

## 2018-03-01 NOTE — Patient Instructions (Signed)

## 2018-03-01 NOTE — Telephone Encounter (Signed)
Pt called that having sinus infections and coughing due to flu going I offered him to come in but he unable to come he said he going to urgent care

## 2018-03-15 ENCOUNTER — Telehealth: Payer: Self-pay | Admitting: Adult Health

## 2018-03-15 DIAGNOSIS — C44519 Basal cell carcinoma of skin of other part of trunk: Secondary | ICD-10-CM | POA: Diagnosis not present

## 2018-03-15 NOTE — Telephone Encounter (Signed)
Called patient in regards to medication refill request from pharmacy. Patient is not taking Glyburide 5mg .

## 2018-03-17 ENCOUNTER — Other Ambulatory Visit: Payer: Self-pay | Admitting: Adult Health

## 2018-03-29 DIAGNOSIS — C44519 Basal cell carcinoma of skin of other part of trunk: Secondary | ICD-10-CM | POA: Diagnosis not present

## 2018-04-01 ENCOUNTER — Encounter: Payer: Self-pay | Admitting: Nurse Practitioner

## 2018-04-01 ENCOUNTER — Ambulatory Visit: Payer: BC Managed Care – PPO | Admitting: Nurse Practitioner

## 2018-04-01 VITALS — BP 120/80 | HR 96 | Resp 16 | Ht 71.0 in | Wt 221.0 lb

## 2018-04-01 DIAGNOSIS — K219 Gastro-esophageal reflux disease without esophagitis: Secondary | ICD-10-CM

## 2018-04-01 DIAGNOSIS — E1165 Type 2 diabetes mellitus with hyperglycemia: Secondary | ICD-10-CM

## 2018-04-01 DIAGNOSIS — M159 Polyosteoarthritis, unspecified: Secondary | ICD-10-CM

## 2018-04-01 DIAGNOSIS — I10 Essential (primary) hypertension: Secondary | ICD-10-CM | POA: Diagnosis not present

## 2018-04-01 DIAGNOSIS — N3289 Other specified disorders of bladder: Secondary | ICD-10-CM

## 2018-04-01 LAB — POCT GLYCOSYLATED HEMOGLOBIN (HGB A1C): Hemoglobin A1C: 11.2 % — AB (ref 4.0–5.6)

## 2018-04-01 MED ORDER — GLYBURIDE 5 MG PO TABS: 5.0000 mg | ORAL_TABLET | Freq: Two times a day (BID) | ORAL | 3 refills | Status: DC

## 2018-04-01 MED ORDER — IBUPROFEN 800 MG PO TABS: 800.0000 mg | ORAL_TABLET | Freq: Three times a day (TID) | ORAL | 1 refills | Status: DC | PRN

## 2018-04-01 MED ORDER — PHENAZOPYRIDINE HCL 200 MG PO TABS: 200.0000 mg | ORAL_TABLET | Freq: Three times a day (TID) | ORAL | 2 refills | Status: DC | PRN

## 2018-04-01 NOTE — Patient Instructions (Signed)
Diabetes Mellitus and Nutrition, Adult  When you have diabetes (diabetes mellitus), it is very important to have healthy eating habits because your blood sugar (glucose) levels are greatly affected by what you eat and drink. Eating healthy foods in the appropriate amounts, at about the same times every day, can help you:  · Control your blood glucose.  · Lower your risk of heart disease.  · Improve your blood pressure.  · Reach or maintain a healthy weight.  Every person with diabetes is different, and each person has different needs for a meal plan. Your health care provider may recommend that you work with a diet and nutrition specialist (dietitian) to make a meal plan that is best for you. Your meal plan may vary depending on factors such as:  · The calories you need.  · The medicines you take.  · Your weight.  · Your blood glucose, blood pressure, and cholesterol levels.  · Your activity level.  · Other health conditions you have, such as heart or kidney disease.  How do carbohydrates affect me?  Carbohydrates, also called carbs, affect your blood glucose level more than any other type of food. Eating carbs naturally raises the amount of glucose in your blood. Carb counting is a method for keeping track of how many carbs you eat. Counting carbs is important to keep your blood glucose at a healthy level, especially if you use insulin or take certain oral diabetes medicines.  It is important to know how many carbs you can safely have in each meal. This is different for every person. Your dietitian can help you calculate how many carbs you should have at each meal and for each snack.  Foods that contain carbs include:  · Bread, cereal, rice, pasta, and crackers.  · Potatoes and corn.  · Peas, beans, and lentils.  · Milk and yogurt.  · Fruit and juice.  · Desserts, such as cakes, cookies, ice cream, and candy.  How does alcohol affect me?  Alcohol can cause a sudden decrease in blood glucose (hypoglycemia),  especially if you use insulin or take certain oral diabetes medicines. Hypoglycemia can be a life-threatening condition. Symptoms of hypoglycemia (sleepiness, dizziness, and confusion) are similar to symptoms of having too much alcohol.  If your health care provider says that alcohol is safe for you, follow these guidelines:  · Limit alcohol intake to no more than 1 drink per day for nonpregnant women and 2 drinks per day for men. One drink equals 12 oz of beer, 5 oz of wine, or 1½ oz of hard liquor.  · Do not drink on an empty stomach.  · Keep yourself hydrated with water, diet soda, or unsweetened iced tea.  · Keep in mind that regular soda, juice, and other mixers may contain a lot of sugar and must be counted as carbs.  What are tips for following this plan?    Reading food labels  · Start by checking the serving size on the "Nutrition Facts" label of packaged foods and drinks. The amount of calories, carbs, fats, and other nutrients listed on the label is based on one serving of the item. Many items contain more than one serving per package.  · Check the total grams (g) of carbs in one serving. You can calculate the number of servings of carbs in one serving by dividing the total carbs by 15. For example, if a food has 30 g of total carbs, it would be equal to 2   servings of carbs.  · Check the number of grams (g) of saturated and trans fats in one serving. Choose foods that have low or no amount of these fats.  · Check the number of milligrams (mg) of salt (sodium) in one serving. Most people should limit total sodium intake to less than 2,300 mg per day.  · Always check the nutrition information of foods labeled as "low-fat" or "nonfat". These foods may be higher in added sugar or refined carbs and should be avoided.  · Talk to your dietitian to identify your daily goals for nutrients listed on the label.  Shopping  · Avoid buying canned, premade, or processed foods. These foods tend to be high in fat, sodium,  and added sugar.  · Shop around the outside edge of the grocery store. This includes fresh fruits and vegetables, bulk grains, fresh meats, and fresh dairy.  Cooking  · Use low-heat cooking methods, such as baking, instead of high-heat cooking methods like deep frying.  · Cook using healthy oils, such as olive, canola, or sunflower oil.  · Avoid cooking with butter, cream, or high-fat meats.  Meal planning  · Eat meals and snacks regularly, preferably at the same times every day. Avoid going long periods of time without eating.  · Eat foods high in fiber, such as fresh fruits, vegetables, beans, and whole grains. Talk to your dietitian about how many servings of carbs you can eat at each meal.  · Eat 4-6 ounces (oz) of lean protein each day, such as lean meat, chicken, fish, eggs, or tofu. One oz of lean protein is equal to:  ? 1 oz of meat, chicken, or fish.  ? 1 egg.  ? ¼ cup of tofu.  · Eat some foods each day that contain healthy fats, such as avocado, nuts, seeds, and fish.  Lifestyle  · Check your blood glucose regularly.  · Exercise regularly as told by your health care provider. This may include:  ? 150 minutes of moderate-intensity or vigorous-intensity exercise each week. This could be brisk walking, biking, or water aerobics.  ? Stretching and doing strength exercises, such as yoga or weightlifting, at least 2 times a week.  · Take medicines as told by your health care provider.  · Do not use any products that contain nicotine or tobacco, such as cigarettes and e-cigarettes. If you need help quitting, ask your health care provider.  · Work with a counselor or diabetes educator to identify strategies to manage stress and any emotional and social challenges.  Questions to ask a health care provider  · Do I need to meet with a diabetes educator?  · Do I need to meet with a dietitian?  · What number can I call if I have questions?  · When are the best times to check my blood glucose?  Where to find more  information:  · American Diabetes Association: diabetes.org  · Academy of Nutrition and Dietetics: www.eatright.org  · National Institute of Diabetes and Digestive and Kidney Diseases (NIH): www.niddk.nih.gov  Summary  · A healthy meal plan will help you control your blood glucose and maintain a healthy lifestyle.  · Working with a diet and nutrition specialist (dietitian) can help you make a meal plan that is best for you.  · Keep in mind that carbohydrates (carbs) and alcohol have immediate effects on your blood glucose levels. It is important to count carbs and to use alcohol carefully.  This information is not intended to   replace advice given to you by your health care provider. Make sure you discuss any questions you have with your health care provider.  Document Released: 10/06/2004 Document Revised: 08/09/2016 Document Reviewed: 02/14/2016  Elsevier Interactive Patient Education © 2019 [employer]

## 2018-04-01 NOTE — Progress Notes (Signed)
[facility] [address] [place], [place] [address]  Internal MEDICINE  Office Visit Note  Patient Name: Ricardo Durham  [id number]  [id number]  Date of Service: 04/14/2018  Chief Complaint  Patient presents with  . Diabetes  . Follow-up    3 month    Diabetes  He presents for his follow-up diabetic visit. He has type 2 diabetes mellitus. The initial diagnosis of diabetes was made 10 years ago. His disease course has been improving. There are no hypoglycemic associated symptoms. Pertinent negatives for hypoglycemia include no dizziness, nervousness/anxiousness or tremors. Pertinent negatives for diabetes include no chest pain, no fatigue, no polydipsia and no polyuria. There are no hypoglycemic complications. Symptoms are worsening. Risk factors for coronary artery disease include dyslipidemia, hypertension and male sex. Current diabetic treatment includes oral agent (triple therapy). He is compliant with treatment some of the time. He is following a generally healthy diet. Meal planning includes avoidance of concentrated sweets. He has not had a previous visit with a dietitian. He participates in exercise intermittently. His home blood glucose trend is increasing steadily. An ACE inhibitor/angiotensin II receptor blocker is not being taken.       Current Medication: Outpatient Encounter Medications as of 04/01/2018  Medication Sig  . ALOE VERA MOISTURIZING EX Apply topically.  Marland Kitchen aspirin 81 MG tablet Take 81 mg by mouth every other day.  Marland Kitchen atorvastatin (LIPITOR) 80 MG tablet Take 1 tablet (80 mg total) by mouth daily.  . canagliflozin (INVOKANA) 100 MG TABS tablet Take 1 tablet (100 mg total) by mouth daily before breakfast.  . glucose blood (ACCU-CHEK AVIVA PLUS) test strip Use once daily for sugar e11.65  . isosorbide mononitrate (IMDUR) 30 MG 24 hr tablet Take 30 mg by mouth daily.  Marland Kitchen JANUMET 50-1000 MG tablet TAKE 1 TABLET BY MOUTH TWICE A DAY  . lisinopril  (PRINIVIL,ZESTRIL) 5 MG tablet Take 5 mg by mouth daily.  . pantoprazole (PROTONIX) 40 MG tablet Take 1 tablet (40 mg total) by mouth 2 (two) times daily.  . phenazopyridine (PYRIDIUM) 200 MG tablet Take 1 tablet (200 mg total) by mouth 3 (three) times daily as needed for pain.  Marland Kitchen sucralfate (CARAFATE) 1 g tablet TAKE ONE TAB BY MOUTH THREE TIMES A DAY PRIOR TO MEALS  . [DISCONTINUED] phenazopyridine (PYRIDIUM) 200 MG tablet Take 1 tablet (200 mg total) by mouth 3 (three) times daily as needed for pain.  Marland Kitchen glyBURIDE (DIABETA) 5 MG tablet Take 1 tablet (5 mg total) by mouth 2 (two) times daily with a meal.  . ibuprofen (ADVIL,MOTRIN) 800 MG tablet Take 1 tablet (800 mg total) by mouth every 8 (eight) hours as needed.  . [DISCONTINUED] sulfamethoxazole-trimethoprim (BACTRIM DS,SEPTRA DS) 800-160 MG tablet Take 1 tablet by mouth 2 (two) times daily. (Patient not taking: Reported on 04/01/2018)   No facility-administered encounter medications on file as of 04/01/2018.     Surgical History: Past Surgical History:  Procedure Laterality Date  . CARDIAC CATHETERIZATION Left 08/24/2015   Procedure: Left Heart Cath and Coronary Angiography;  Surgeon: Corey Skains, MD;  Location: [facility];  Service: Cardiovascular;  Laterality: Left;  . COLONOSCOPY WITH PROPOFOL N/A 10/26/2014   Procedure: COLONOSCOPY WITH PROPOFOL;  Surgeon: Lollie Sails, MD;  Location: [facility] ENDOSCOPY;  Service: Endoscopy;  Laterality: N/A;  . ESOPHAGOGASTRODUODENOSCOPY (EGD) WITH PROPOFOL N/A 10/26/2014   Procedure: ESOPHAGOGASTRODUODENOSCOPY (EGD) WITH PROPOFOL;  Surgeon: Lollie Sails, MD;  Location: [facility] ENDOSCOPY;  Service: Endoscopy;  Laterality: N/A;  . NECK SURGERY     c 6-7 due to stable fracture  . NISSEN FUNDOPLICATION    . UMBILICAL HERNIA REPAIR  2001    Medical History: Past Medical History:  Diagnosis Date  . COPD (chronic obstructive pulmonary disease) ([place])   . Diabetes mellitus without  complication ([place])     Family History: Family History  Problem Relation Age of Onset  . Diabetes Father     Social History   Socioeconomic History  . Marital status: Divorced    Spouse name: Not on file  . Number of children: Not on file  . Years of education: Not on file  . Highest education level: Not on file  Occupational History  . Not on file  Social Needs  . Financial resource strain: Not on file  . Food insecurity:    Worry: Not on file    Inability: Not on file  . Transportation needs:    Medical: Not on file    Non-medical: Not on file  Tobacco Use  . Smoking status: Former Smoker    Types: Cigarettes  . Smokeless tobacco: Never Used  Substance and Sexual Activity  . Alcohol use: Yes    Alcohol/week: 1.0 standard drinks    Types: 1 Cans of beer per week    Comment: occasionally  . Drug use: No  . Sexual activity: Not on file  Lifestyle  . Physical activity:    Days per week: Not on file    Minutes per session: Not on file  . Stress: Not on file  Relationships  . Social connections:    Talks on phone: Not on file    Gets together: Not on file    Attends religious service: Not on file    Active member of club or organization: Not on file    Attends meetings of clubs or organizations: Not on file    Relationship status: Not on file  . Intimate partner violence:    Fear of current or ex partner: Not on file    Emotionally abused: Not on file    Physically abused: Not on file    Forced sexual activity: Not on file  Other Topics Concern  . Not on file  Social History Narrative  . Not on file      Review of Systems  Constitutional: Negative for chills, fatigue and unexpected weight change.  HENT: Negative for congestion, postnasal drip, rhinorrhea, sneezing and sore throat.   Respiratory: Negative for cough, chest tightness, shortness of breath and wheezing.   Cardiovascular: Negative for chest pain and palpitations.  Gastrointestinal: Negative  for abdominal pain, constipation, diarrhea, nausea and vomiting.  Endocrine: Negative for cold intolerance, heat intolerance, polydipsia and polyuria.       Uncontrolled blood sugars.   Genitourinary: Negative for dysuria and frequency.  Musculoskeletal: Negative for arthralgias, back pain, joint swelling and neck pain.  Skin: Negative for rash.  Allergic/Immunologic: Negative for environmental allergies.  Neurological: Negative for dizziness, tremors and numbness.  Hematological: Negative for adenopathy. Does not bruise/bleed easily.  Psychiatric/Behavioral: Negative for behavioral problems, sleep disturbance and suicidal ideas. The patient is not nervous/anxious.     Today's Vitals   04/01/18 1619  BP: 120/80  Pulse: 96  Resp: 16  SpO2: 97%  Weight: 221 lb (100.2 kg)  Height: 5\' 11"  (1.803 m)   Body mass index is 30.82 kg/m.  Physical Exam Vitals signs and nursing note reviewed.  Constitutional:  General: He is not in acute distress.    Appearance: Normal appearance. He is well-developed. He is not diaphoretic.  HENT:     Head: Normocephalic and atraumatic.     Mouth/Throat:     Pharynx: No oropharyngeal exudate.  Eyes:     Pupils: Pupils are equal, round, and reactive to light.  Neck:     Musculoskeletal: Normal range of motion and neck supple.     Thyroid: No thyromegaly.     Vascular: No JVD.     Trachea: No tracheal deviation.  Cardiovascular:     Rate and Rhythm: Normal rate and regular rhythm.     Heart sounds: Normal heart sounds. No murmur. No friction rub. No gallop.   Pulmonary:     Effort: Pulmonary effort is normal. No respiratory distress.     Breath sounds: Normal breath sounds. No wheezing or rales.  Chest:     Chest wall: No tenderness.  Abdominal:     Palpations: Abdomen is soft.     Tenderness: There is no abdominal tenderness. There is no guarding.  Musculoskeletal: Normal range of motion.  Lymphadenopathy:     Cervical: No cervical  adenopathy.  Skin:    General: Skin is warm and dry.  Neurological:     Mental Status: He is alert and oriented to person, place, and time.     Cranial Nerves: No cranial nerve deficit.  Psychiatric:        Behavior: Behavior normal.        Thought Content: Thought content normal.        Judgment: Judgment normal.    Assessment/Plan: 1. Uncontrolled type 2 diabetes mellitus with hyperglycemia (HCC) - POCT HgB A1C 11.2 today. Getting worse rather than better. Continue Janumet as prescribed. Increase glyburide to 5mg  twice daily daily. Reviewed importance of following diabetic diet. Advised him to check blood sugars every day. nistructed him to bring sugar log with him to next visit.  - glyBURIDE (DIABETA) 5 MG tablet; Take 1 tablet (5 mg total) by mouth 2 (two) times daily with a meal.  Dispense: 60 tablet; Refill: 3  2. Generalized osteoarthritis May take ibuprofen 800mg  up to three times daily as needed for pain/inflammation.  - ibuprofen (ADVIL,MOTRIN) 800 MG tablet; Take 1 tablet (800 mg total) by mouth every 8 (eight) hours as needed.  Dispense: 90 tablet; Refill: 1  3. Essential hypertension Stable. Continue bp medication as prescribed   4. Gastroesophageal reflux disease without esophagitis Continue pantoprazole as prescribed   5. Bladder spasm May take pyridium 200mg  as needed and as prescribed for bladder pain and spasms.  - phenazopyridine (PYRIDIUM) 200 MG tablet; Take 1 tablet (200 mg total) by mouth 3 (three) times daily as needed for pain.  Dispense: 10 tablet; Refill: 2  General Counseling: [name] verbalizes understanding of the findings of todays visit and agrees with plan of treatment. I have discussed any further diagnostic evaluation that may be needed or ordered today. We also reviewed his medications today. he has been encouraged to call the office with any questions or concerns that should arise related to todays visit.  Diabetes Counseling:  1. Addition of ACE  inh/ ARB'S for nephroprotection. Microalbumin is updated  2. Diabetic foot care, prevention of complications. Podiatry consult 3. Exercise and lose weight.  4. Diabetic eye examination, Diabetic eye exam is updated  5. Monitor blood sugar closlely. nutrition counseling.  6. Sign and symptoms of hypoglycemia including shaking sweating,confusion and headaches.  This patient  was seen by Leretha Pol FNP Collaboration with Dr Lavera Guise as a part of collaborative care agreement  Orders Placed This Encounter  Procedures  . POCT HgB A1C    Meds ordered this encounter  Medications  . glyBURIDE (DIABETA) 5 MG tablet    Sig: Take 1 tablet (5 mg total) by mouth 2 (two) times daily with a meal.    Dispense:  60 tablet    Refill:  3    Order Specific Question:   Supervising Provider    Answer:   Lavera Guise [place]  . phenazopyridine (PYRIDIUM) 200 MG tablet    Sig: Take 1 tablet (200 mg total) by mouth 3 (three) times daily as needed for pain.    Dispense:  10 tablet    Refill:  2    Order Specific Question:   Supervising Provider    Answer:   Lavera Guise [[id number]]  . ibuprofen (ADVIL,MOTRIN) 800 MG tablet    Sig: Take 1 tablet (800 mg total) by mouth every 8 (eight) hours as needed.    Dispense:  90 tablet    Refill:  1    Order Specific Question:   Supervising Provider    Answer:   Lavera Guise [[id number]]    Time spent: 40 Minutes      Dr Lavera Guise Internal medicine

## 2018-04-05 ENCOUNTER — Encounter: Payer: Self-pay | Admitting: Adult Health

## 2018-04-05 ENCOUNTER — Ambulatory Visit: Payer: BC Managed Care – PPO | Admitting: Adult Health

## 2018-04-05 ENCOUNTER — Other Ambulatory Visit: Payer: Self-pay

## 2018-04-05 VITALS — BP 140/86 | HR 75 | Temp 97.8°F | Resp 16 | Ht 71.0 in | Wt 221.0 lb

## 2018-04-05 DIAGNOSIS — J0110 Acute frontal sinusitis, unspecified: Secondary | ICD-10-CM | POA: Diagnosis not present

## 2018-04-05 DIAGNOSIS — J449 Chronic obstructive pulmonary disease, unspecified: Secondary | ICD-10-CM | POA: Diagnosis not present

## 2018-04-05 DIAGNOSIS — I10 Essential (primary) hypertension: Secondary | ICD-10-CM

## 2018-04-05 DIAGNOSIS — K219 Gastro-esophageal reflux disease without esophagitis: Secondary | ICD-10-CM

## 2018-04-05 MED ORDER — CEFDINIR 300 MG PO CAPS: 300.0000 mg | ORAL_CAPSULE | Freq: Two times a day (BID) | ORAL | 0 refills | Status: DC

## 2018-04-05 NOTE — Progress Notes (Signed)
[facility] [place], [place] [address]  Internal MEDICINE  Office Visit Note  Patient Name: Ricardo Durham  [id number]  [id number]  Date of Service: 04/05/2018  Chief Complaint  Patient presents with  . Sinusitis     HPI Pt is here for a sick visit. He reports 2 days of sinus pressure and PND. He denies an fever.  He has some mild cough due to drainage.  He reports a sore throat in the morning, that gets better through out the day.       Current Medication:  Outpatient Encounter Medications as of 04/05/2018  Medication Sig  . ALOE VERA MOISTURIZING EX Apply topically.  Marland Kitchen aspirin 81 MG tablet Take 81 mg by mouth every other day.  Marland Kitchen atorvastatin (LIPITOR) 80 MG tablet Take 1 tablet (80 mg total) by mouth daily.  . canagliflozin (INVOKANA) 100 MG TABS tablet Take 1 tablet (100 mg total) by mouth daily before breakfast.  . glucose blood (ACCU-CHEK AVIVA PLUS) test strip Use once daily for sugar e11.65  . glyBURIDE (DIABETA) 5 MG tablet Take 1 tablet (5 mg total) by mouth 2 (two) times daily with a meal.  . ibuprofen (ADVIL,MOTRIN) 800 MG tablet Take 1 tablet (800 mg total) by mouth every 8 (eight) hours as needed.  . isosorbide mononitrate (IMDUR) 30 MG 24 hr tablet Take 30 mg by mouth daily.  Marland Kitchen JANUMET 50-1000 MG tablet TAKE 1 TABLET BY MOUTH TWICE A DAY  . lisinopril (PRINIVIL,ZESTRIL) 5 MG tablet Take 5 mg by mouth daily.  . pantoprazole (PROTONIX) 40 MG tablet Take 1 tablet (40 mg total) by mouth 2 (two) times daily.  . phenazopyridine (PYRIDIUM) 200 MG tablet Take 1 tablet (200 mg total) by mouth 3 (three) times daily as needed for pain.  Marland Kitchen sucralfate (CARAFATE) 1 g tablet TAKE ONE TAB BY MOUTH THREE TIMES A DAY PRIOR TO MEALS   No facility-administered encounter medications on file as of 04/05/2018.       Medical History: Past Medical History:  Diagnosis Date  . COPD (chronic obstructive pulmonary disease) ([place])   . Diabetes mellitus without  complication (HCC)      Vital Signs: BP 140/86   Pulse 75   Temp 97.8 F (36.6 C)   Resp 16   Ht 5\' 11"  (1.803 m)   Wt 221 lb (100.2 kg)   SpO2 95%   BMI 30.82 kg/m    Review of Systems  Constitutional: Negative.  Negative for chills, fatigue and unexpected weight change.  HENT: Negative.  Negative for congestion, rhinorrhea, sneezing and sore throat.   Eyes: Negative for redness.  Respiratory: Negative.  Negative for cough, chest tightness and shortness of breath.   Cardiovascular: Negative.  Negative for chest pain and palpitations.  Gastrointestinal: Negative.  Negative for abdominal pain, constipation, diarrhea, nausea and vomiting.  Endocrine: Negative.   Genitourinary: Negative.  Negative for dysuria and frequency.  Musculoskeletal: Negative.  Negative for arthralgias, back pain, joint swelling and neck pain.  Skin: Negative.  Negative for rash.  Allergic/Immunologic: Negative.   Neurological: Negative.  Negative for tremors and numbness.  Hematological: Negative for adenopathy. Does not bruise/bleed easily.  Psychiatric/Behavioral: Negative.  Negative for behavioral problems, sleep disturbance and suicidal ideas. The patient is not nervous/anxious.     Physical Exam Vitals signs and nursing note reviewed.  Constitutional:      General: He is not in acute distress.    Appearance: He is well-developed. He is  not diaphoretic.  HENT:     Head: Normocephalic and atraumatic.     Mouth/Throat:     Pharynx: No oropharyngeal exudate.  Eyes:     Pupils: Pupils are equal, round, and reactive to light.  Neck:     Musculoskeletal: Normal range of motion and neck supple.     Thyroid: No thyromegaly.     Vascular: No JVD.     Trachea: No tracheal deviation.  Cardiovascular:     Rate and Rhythm: Normal rate and regular rhythm.     Heart sounds: Normal heart sounds. No murmur. No friction rub. No gallop.   Pulmonary:     Effort: Pulmonary effort is normal. No respiratory  distress.     Breath sounds: Normal breath sounds. No wheezing or rales.  Chest:     Chest wall: No tenderness.  Abdominal:     Palpations: Abdomen is soft.     Tenderness: There is no abdominal tenderness. There is no guarding.  Musculoskeletal: Normal range of motion.  Lymphadenopathy:     Cervical: No cervical adenopathy.  Skin:    General: Skin is warm and dry.  Neurological:     Mental Status: He is alert and oriented to person, place, and time.     Cranial Nerves: No cranial nerve deficit.  Psychiatric:        Behavior: Behavior normal.        Thought Content: Thought content normal.        Judgment: Judgment normal.    Assessment/Plan: 1. Acute non-recurrent frontal sinusitis Instructed patient to take cefdinir as directed.  He will take allocations until completed.  Continue to rest and drink plenty of fluids.  Return to clinic in 7 to 10 days if symptoms fail to improve. - cefdinir (OMNICEF) 300 MG capsule; Take 1 capsule (300 mg total) by mouth 2 (two) times daily.  Dispense: 14 capsule; Refill: 0  2. Essential hypertension Stable at this time.  Continue current medication regimen.  3. Chronic obstructive pulmonary disease, unspecified COPD type ([place]) Stable, continue use inhalers as directed.  4. Gastroesophageal reflux disease without esophagitis Continue Protonix as prescribed.  General Counseling: Ricardo Durham understanding of the findings of todays visit and agrees with plan of treatment. I have discussed any further diagnostic evaluation that may be needed or ordered today. We also reviewed his medications today. he has been encouraged to call the office with any questions or concerns that should arise related to todays visit.   No orders of the defined types were placed in this encounter.   No orders of the defined types were placed in this encounter.   Time spent: 30 Minutes  This patient was seen by [name] AGNP-C in Collaboration with Dr  Lavera Guise as a part of collaborative care agreement.  [name] AGNP-C Internal Medicine

## 2018-04-10 ENCOUNTER — Other Ambulatory Visit: Payer: Self-pay

## 2018-04-10 MED ORDER — AZITHROMYCIN 250 MG PO TABS: ORAL_TABLET | ORAL | 0 refills | Status: DC

## 2018-04-10 NOTE — Telephone Encounter (Signed)
Pt called that we gave him cefdinir is not helping as per adam send zpak and advised pt if not feeling better need to been seen

## 2018-04-12 DIAGNOSIS — C44612 Basal cell carcinoma of skin of right upper limb, including shoulder: Secondary | ICD-10-CM | POA: Diagnosis not present

## 2018-04-14 DIAGNOSIS — M159 Polyosteoarthritis, unspecified: Secondary | ICD-10-CM | POA: Insufficient documentation

## 2018-04-14 DIAGNOSIS — N3289 Other specified disorders of bladder: Secondary | ICD-10-CM | POA: Insufficient documentation

## 2018-04-19 ENCOUNTER — Other Ambulatory Visit: Payer: Self-pay

## 2018-04-19 ENCOUNTER — Telehealth: Payer: Self-pay

## 2018-04-19 MED ORDER — AZITHROMYCIN 250 MG PO TABS: ORAL_TABLET | ORAL | 0 refills | Status: DC

## 2018-04-19 NOTE — Telephone Encounter (Signed)
Spoke with pt we send zpak as per dr Humphrey Rolls

## 2018-04-19 NOTE — Telephone Encounter (Signed)
lmom with mom that we going to send another around of antibiotic for sinus infection as per dr Humphrey Rolls

## 2018-04-25 ENCOUNTER — Other Ambulatory Visit: Payer: Self-pay | Admitting: Adult Health

## 2018-04-25 DIAGNOSIS — E1165 Type 2 diabetes mellitus with hyperglycemia: Secondary | ICD-10-CM

## 2018-05-15 ENCOUNTER — Other Ambulatory Visit: Payer: Self-pay | Admitting: Adult Health

## 2018-05-15 ENCOUNTER — Encounter: Payer: Self-pay | Admitting: Nurse Practitioner

## 2018-05-27 ENCOUNTER — Other Ambulatory Visit: Payer: Self-pay

## 2018-05-27 DIAGNOSIS — M159 Polyosteoarthritis, unspecified: Secondary | ICD-10-CM

## 2018-05-27 MED ORDER — IBUPROFEN 800 MG PO TABS: 800.0000 mg | ORAL_TABLET | Freq: Three times a day (TID) | ORAL | 1 refills | Status: DC | PRN

## 2018-05-31 DIAGNOSIS — E782 Mixed hyperlipidemia: Secondary | ICD-10-CM | POA: Diagnosis not present

## 2018-06-10 ENCOUNTER — Other Ambulatory Visit: Payer: Self-pay

## 2018-06-10 MED ORDER — SUCRALFATE 1 G PO TABS: 1.0000 g | ORAL_TABLET | Freq: Three times a day (TID) | ORAL | 1 refills | Status: DC

## 2018-07-04 ENCOUNTER — Other Ambulatory Visit: Payer: Self-pay

## 2018-07-04 ENCOUNTER — Ambulatory Visit: Payer: BC Managed Care – PPO | Admitting: Nurse Practitioner

## 2018-07-04 ENCOUNTER — Encounter: Payer: Self-pay | Admitting: Nurse Practitioner

## 2018-07-04 VITALS — BP 135/81 | HR 70 | Resp 16 | Ht 71.0 in | Wt 230.0 lb

## 2018-07-04 DIAGNOSIS — I2510 Atherosclerotic heart disease of native coronary artery without angina pectoris: Secondary | ICD-10-CM

## 2018-07-04 DIAGNOSIS — I2583 Coronary atherosclerosis due to lipid rich plaque: Secondary | ICD-10-CM

## 2018-07-04 DIAGNOSIS — I10 Essential (primary) hypertension: Secondary | ICD-10-CM | POA: Diagnosis not present

## 2018-07-04 DIAGNOSIS — E291 Testicular hypofunction: Secondary | ICD-10-CM | POA: Diagnosis not present

## 2018-07-04 DIAGNOSIS — E1165 Type 2 diabetes mellitus with hyperglycemia: Secondary | ICD-10-CM | POA: Diagnosis not present

## 2018-07-04 LAB — POCT GLYCOSYLATED HEMOGLOBIN (HGB A1C): Hemoglobin A1C: 7.8 % — AB (ref 4.0–5.6)

## 2018-07-04 MED ORDER — SILDENAFIL CITRATE 50 MG PO TABS: ORAL_TABLET | ORAL | 3 refills | Status: DC

## 2018-07-04 NOTE — Progress Notes (Signed)
[facility] [place], [place] [address]  Internal MEDICINE  Office Visit Note  Patient Name: Ricardo Durham  [id number]  [id number]  Date of Service: 07/04/2018  Chief Complaint  Patient presents with  . Medical Management of Chronic Issues    3 month follow up  . Diabetes    A1C    The patient is here for routine follow up. Blood sugars much improved. HgbA1c is 7.8, down from 11.2 at his last visit. He states that he feels well. Has some issue with erectile dysfunction. Has tried viagra in the past and this worked very well. Would like a new prescription.       Current Medication: Outpatient Encounter Medications as of 07/04/2018  Medication Sig  . ALOE VERA MOISTURIZING EX Apply topically.  Marland Kitchen aspirin 81 MG tablet Take 81 mg by mouth every other day.  Marland Kitchen atorvastatin (LIPITOR) 80 MG tablet Take 1 tablet (80 mg total) by mouth daily.  . cefdinir (OMNICEF) 300 MG capsule Take 1 capsule (300 mg total) by mouth 2 (two) times daily.  Marland Kitchen glucose blood (ACCU-CHEK AVIVA PLUS) test strip Use once daily for sugar e11.65  . glyBURIDE (DIABETA) 5 MG tablet Take 1 tablet (5 mg total) by mouth 2 (two) times daily with a meal.  . ibuprofen (ADVIL) 800 MG tablet Take 1 tablet (800 mg total) by mouth every 8 (eight) hours as needed.  . INVOKANA 100 MG TABS tablet TAKE 1 TABLET (100 MG TOTAL) BY MOUTH DAILY BEFORE BREAKFAST.  Marland Kitchen isosorbide mononitrate (IMDUR) 30 MG 24 hr tablet Take 30 mg by mouth daily.  Marland Kitchen JANUMET 50-1000 MG tablet TAKE 1 TABLET BY MOUTH TWICE A DAY  . lisinopril (PRINIVIL,ZESTRIL) 5 MG tablet Take 5 mg by mouth daily.  . pantoprazole (PROTONIX) 40 MG tablet Take 1 tablet (40 mg total) by mouth 2 (two) times daily.  . phenazopyridine (PYRIDIUM) 200 MG tablet Take 1 tablet (200 mg total) by mouth 3 (three) times daily as needed for pain.  Marland Kitchen sucralfate (CARAFATE) 1 g tablet Take 1 tablet (1 g total) by mouth 4 (four) times daily -  with meals and at bedtime.   . Allantoin 0.5 % LOTN Apply topically.  . sildenafil (VIAGRA) 50 MG tablet Take 1 to 2 tablets po QD prn  . [DISCONTINUED] azithromycin (ZITHROMAX Z-PAK) 250 MG tablet Use as directed for 5 days (Patient not taking: Reported on 07/04/2018)   No facility-administered encounter medications on file as of 07/04/2018.     Surgical History: Past Surgical History:  Procedure Laterality Date  . CARDIAC CATHETERIZATION Left 08/24/2015   Procedure: Left Heart Cath and Coronary Angiography;  Surgeon: Corey Skains, MD;  Location: [place] CV LAB;  Service: Cardiovascular;  Laterality: Left;  . COLONOSCOPY WITH PROPOFOL N/A 10/26/2014   Procedure: COLONOSCOPY WITH PROPOFOL;  Surgeon: Lollie Sails, MD;  Location: [facility] ENDOSCOPY;  Service: Endoscopy;  Laterality: N/A;  . ESOPHAGOGASTRODUODENOSCOPY (EGD) WITH PROPOFOL N/A 10/26/2014   Procedure: ESOPHAGOGASTRODUODENOSCOPY (EGD) WITH PROPOFOL;  Surgeon: Lollie Sails, MD;  Location: [facility] ENDOSCOPY;  Service: Endoscopy;  Laterality: N/A;  . NECK SURGERY     c 6-7 due to stable fracture  . NISSEN FUNDOPLICATION    . UMBILICAL HERNIA REPAIR  2001    Medical History: Past Medical History:  Diagnosis Date  . COPD (chronic obstructive pulmonary disease) ([place])   . Diabetes mellitus without complication ([place])     Family History: Family History  Problem  Relation Age of Onset  . Diabetes Father     Social History   Socioeconomic History  . Marital status: Divorced    Spouse name: Not on file  . Number of children: Not on file  . Years of education: Not on file  . Highest education level: Not on file  Occupational History  . Not on file  Social Needs  . Financial resource strain: Not on file  . Food insecurity    Worry: Not on file    Inability: Not on file  . Transportation needs    Medical: Not on file    Non-medical: Not on file  Tobacco Use  . Smoking status: Former Smoker    Types: Cigarettes  . Smokeless tobacco:  Never Used  Substance and Sexual Activity  . Alcohol use: Yes    Alcohol/week: 1.0 standard drinks    Types: 1 Cans of beer per week    Comment: occasionally  . Drug use: No  . Sexual activity: Not on file  Lifestyle  . Physical activity    Days per week: Not on file    Minutes per session: Not on file  . Stress: Not on file  Relationships  . Social Herbalist on phone: Not on file    Gets together: Not on file    Attends religious service: Not on file    Active member of club or organization: Not on file    Attends meetings of clubs or organizations: Not on file    Relationship status: Not on file  . Intimate partner violence    Fear of current or ex partner: Not on file    Emotionally abused: Not on file    Physically abused: Not on file    Forced sexual activity: Not on file  Other Topics Concern  . Not on file  Social History Narrative  . Not on file      Review of Systems  Constitutional: Negative for activity change, chills, fatigue and unexpected weight change.  HENT: Negative for congestion, rhinorrhea, sneezing and sore throat.   Respiratory: Negative for cough, chest tightness, shortness of breath and wheezing.   Cardiovascular: Negative for chest pain and palpitations.  Gastrointestinal: Negative for abdominal pain, constipation, diarrhea, nausea and vomiting.  Endocrine: Negative for cold intolerance, heat intolerance, polydipsia and polyuria.       Improved blood sugars from last visit.   Musculoskeletal: Negative for arthralgias, back pain, joint swelling and neck pain.  Skin: Negative for rash.  Allergic/Immunologic: Positive for environmental allergies.  Neurological: Negative for tremors and numbness.  Hematological: Negative for adenopathy. Does not bruise/bleed easily.  Psychiatric/Behavioral: Negative for behavioral problems, sleep disturbance and suicidal ideas. The patient is not nervous/anxious.     Today's Vitals   07/04/18 1357   BP: 135/81  Pulse: 70  Resp: 16  SpO2: 97%  Weight: 230 lb (104.3 kg)  Height: 5\' 11"  (1.803 m)   Body mass index is 32.08 kg/m.  Physical Exam Vitals signs and nursing note reviewed.  Constitutional:      General: He is not in acute distress.    Appearance: Normal appearance. He is well-developed. He is not diaphoretic.  HENT:     Head: Normocephalic and atraumatic.     Mouth/Throat:     Pharynx: No oropharyngeal exudate.  Eyes:     Pupils: Pupils are equal, round, and reactive to light.  Neck:     Musculoskeletal: Normal range of motion and  neck supple.     Thyroid: No thyromegaly.     Vascular: No JVD.     Trachea: No tracheal deviation.  Cardiovascular:     Rate and Rhythm: Normal rate and regular rhythm.     Heart sounds: Normal heart sounds. No murmur. No friction rub. No gallop.   Pulmonary:     Effort: Pulmonary effort is normal. No respiratory distress.     Breath sounds: Normal breath sounds. No wheezing or rales.  Chest:     Chest wall: No tenderness.  Abdominal:     Palpations: Abdomen is soft.     Tenderness: There is no abdominal tenderness. There is no guarding.  Musculoskeletal: Normal range of motion.  Lymphadenopathy:     Cervical: No cervical adenopathy.  Skin:    General: Skin is warm and dry.  Neurological:     Mental Status: He is alert and oriented to person, place, and time.     Cranial Nerves: No cranial nerve deficit.  Psychiatric:        Behavior: Behavior normal.        Thought Content: Thought content normal.        Judgment: Judgment normal.   Assessment/Plan: 1. Uncontrolled type 2 diabetes mellitus with hyperglycemia (HCC) - POCT HgB A1C 7.8 today, down from 11.2 at his last visit. Continue diabetic medication as prescribed.   2. Testicular hypofunction New prescription for viagra 50mg  sent to pharmacy. May take 1 to 2 tablets as needed.  - sildenafil (VIAGRA) 50 MG tablet; Take 1 to 2 tablets po QD prn  Dispense: 20 tablet;  Refill: 3  3. Essential hypertension Stable. Continue bp medication as prescribed   4. Coronary artery disease due to lipid rich plaque Regular visits with cardiology as scheduled.   General Counseling: lux meaders understanding of the findings of todays visit and agrees with plan of treatment. I have discussed any further diagnostic evaluation that may be needed or ordered today. We also reviewed his medications today. he has been encouraged to call the office with any questions or concerns that should arise related to todays visit.  Diabetes Counseling:  1. Addition of ACE inh/ ARB'S for nephroprotection. Microalbumin is updated  2. Diabetic foot care, prevention of complications. Podiatry consult 3. Exercise and lose weight.  4. Diabetic eye examination, Diabetic eye exam is updated  5. Monitor blood sugar closlely. nutrition counseling.  6. Sign and symptoms of hypoglycemia including shaking sweating,confusion and headaches.  This patient was seen by Leretha Pol FNP Collaboration with Dr Lavera Guise as a part of collaborative care agreement  Orders Placed This Encounter  Procedures  . POCT HgB A1C    Meds ordered this encounter  Medications  . sildenafil (VIAGRA) 50 MG tablet    Sig: Take 1 to 2 tablets po QD prn    Dispense:  20 tablet    Refill:  3    Order Specific Question:   Supervising Provider    Answer:   Lavera Guise [[id number]]    Time spent: 71 Minutes      Dr Lavera Guise Internal medicine

## 2018-07-06 ENCOUNTER — Other Ambulatory Visit: Payer: Self-pay | Admitting: Adult Health

## 2018-07-08 ENCOUNTER — Telehealth: Payer: Self-pay | Admitting: Nurse Practitioner

## 2018-07-08 NOTE — Telephone Encounter (Signed)
Sildenafil Citrate 50MG  tablets Approvedon June 14 Effective from 07/04/2018 through 07/04/2019 Pharmacy contacted

## 2018-07-19 ENCOUNTER — Other Ambulatory Visit: Payer: Self-pay | Admitting: Internal Medicine

## 2018-07-19 DIAGNOSIS — E1165 Type 2 diabetes mellitus with hyperglycemia: Secondary | ICD-10-CM

## 2018-07-25 ENCOUNTER — Other Ambulatory Visit: Payer: Self-pay

## 2018-07-25 DIAGNOSIS — M159 Polyosteoarthritis, unspecified: Secondary | ICD-10-CM

## 2018-07-25 MED ORDER — IBUPROFEN 800 MG PO TABS: 800.0000 mg | ORAL_TABLET | Freq: Three times a day (TID) | ORAL | 1 refills | Status: DC | PRN

## 2018-08-07 ENCOUNTER — Other Ambulatory Visit: Payer: Self-pay | Admitting: Nurse Practitioner

## 2018-08-07 MED ORDER — SUCRALFATE 1 G PO TABS: 1.0000 g | ORAL_TABLET | Freq: Three times a day (TID) | ORAL | 1 refills | Status: DC

## 2018-08-12 DIAGNOSIS — I2510 Atherosclerotic heart disease of native coronary artery without angina pectoris: Secondary | ICD-10-CM | POA: Diagnosis not present

## 2018-08-12 DIAGNOSIS — E782 Mixed hyperlipidemia: Secondary | ICD-10-CM | POA: Diagnosis not present

## 2018-08-12 DIAGNOSIS — I10 Essential (primary) hypertension: Secondary | ICD-10-CM | POA: Diagnosis not present

## 2018-08-19 ENCOUNTER — Other Ambulatory Visit: Payer: Self-pay

## 2018-08-19 MED ORDER — JANUMET 50-1000 MG PO TABS: 1.0000 | ORAL_TABLET | Freq: Two times a day (BID) | ORAL | 5 refills | Status: DC

## 2018-08-30 ENCOUNTER — Other Ambulatory Visit: Payer: Self-pay

## 2018-08-30 MED ORDER — PANTOPRAZOLE SODIUM 40 MG PO TBEC: 40.0000 mg | DELAYED_RELEASE_TABLET | Freq: Two times a day (BID) | ORAL | 3 refills | Status: DC

## 2018-09-24 ENCOUNTER — Other Ambulatory Visit: Payer: Self-pay

## 2018-09-24 DIAGNOSIS — M159 Polyosteoarthritis, unspecified: Secondary | ICD-10-CM

## 2018-09-24 MED ORDER — IBUPROFEN 800 MG PO TABS: 800.0000 mg | ORAL_TABLET | Freq: Three times a day (TID) | ORAL | 1 refills | Status: DC | PRN

## 2018-09-28 DIAGNOSIS — Z20828 Contact with and (suspected) exposure to other viral communicable diseases: Secondary | ICD-10-CM | POA: Diagnosis not present

## 2018-09-28 DIAGNOSIS — R0602 Shortness of breath: Secondary | ICD-10-CM | POA: Diagnosis not present

## 2018-09-28 DIAGNOSIS — R05 Cough: Secondary | ICD-10-CM | POA: Diagnosis not present

## 2018-09-28 DIAGNOSIS — J40 Bronchitis, not specified as acute or chronic: Secondary | ICD-10-CM | POA: Diagnosis not present

## 2018-10-01 ENCOUNTER — Other Ambulatory Visit: Payer: Self-pay

## 2018-10-01 MED ORDER — SUCRALFATE 1 G PO TABS: 1.0000 g | ORAL_TABLET | Freq: Three times a day (TID) | ORAL | 1 refills | Status: DC

## 2018-10-03 ENCOUNTER — Encounter: Payer: Self-pay | Admitting: Adult Health

## 2018-10-03 ENCOUNTER — Other Ambulatory Visit: Payer: Self-pay

## 2018-10-03 ENCOUNTER — Ambulatory Visit (INDEPENDENT_AMBULATORY_CARE_PROVIDER_SITE_OTHER): Payer: BC Managed Care – PPO | Admitting: Adult Health

## 2018-10-03 DIAGNOSIS — E1165 Type 2 diabetes mellitus with hyperglycemia: Secondary | ICD-10-CM

## 2018-10-03 DIAGNOSIS — I10 Essential (primary) hypertension: Secondary | ICD-10-CM | POA: Diagnosis not present

## 2018-10-03 DIAGNOSIS — U071 COVID-19: Secondary | ICD-10-CM | POA: Diagnosis not present

## 2018-10-03 MED ORDER — ALBUTEROL SULFATE (2.5 MG/3ML) 0.083% IN NEBU: 2.5000 mg | INHALATION_SOLUTION | Freq: Four times a day (QID) | RESPIRATORY_TRACT | 1 refills | Status: AC | PRN

## 2018-10-03 MED ORDER — AZITHROMYCIN 250 MG PO TABS: ORAL_TABLET | ORAL | 0 refills | Status: DC

## 2018-10-03 NOTE — Progress Notes (Signed)
[facility] [place], [place] [address]  Internal MEDICINE  Telephone Visit  Patient Name: Ricardo Durham  [id number]  [id number]  Date of Service: 10/03/2018  I connected with the patient at 256 by telephone and verified the patients identity using two identifiers.   I discussed the limitations, risks, security and privacy concerns of performing an evaluation and management service by telephone and the availability of in person appointments. I also discussed with the patient that there may be a patient responsible charge related to the service.  The patient expressed understanding and agrees to proceed.    Chief Complaint  Patient presents with  . Telephone Screen  . Annual Exam    pt has covid unable to come to office  5 days of prednisone and zpak , still some drainage and coughing   . Diabetes    bs this  a.m 200 because of the prednisone  . Telephone Assessment    HPI  PT seen via telephone for follow up.  He reports he was diagnosed with covid 5 days ago.  HE is currently taking prednisone and Zpak, and his blood sugars have been elevated as expected.  He denies any further issues.  He reports feeling ok at this time.  Denies any recent fever, or other symptoms.    Current Medication: Outpatient Encounter Medications as of 10/03/2018  Medication Sig  . Allantoin 0.5 % LOTN Apply topically.  . ALOE VERA MOISTURIZING EX Apply topically.  Marland Kitchen aspirin 81 MG tablet Take 81 mg by mouth every other day.  Marland Kitchen atorvastatin (LIPITOR) 80 MG tablet Take 1 tablet (80 mg total) by mouth daily.  Marland Kitchen glucose blood (ACCU-CHEK AVIVA PLUS) test strip Use once daily for sugar e11.65  . glyBURIDE (DIABETA) 5 MG tablet Take 1 tablet (5 mg total) by mouth 2 (two) times daily with a meal.  . ibuprofen (ADVIL) 800 MG tablet Take 1 tablet (800 mg total) by mouth every 8 (eight) hours as needed.  . INVOKANA 100 MG TABS tablet TAKE 1 TABLET (100 MG TOTAL) BY MOUTH DAILY BEFORE  BREAKFAST.  Marland Kitchen isosorbide mononitrate (IMDUR) 30 MG 24 hr tablet Take 30 mg by mouth daily.  Marland Kitchen lisinopril (PRINIVIL,ZESTRIL) 5 MG tablet Take 5 mg by mouth daily.  . pantoprazole (PROTONIX) 40 MG tablet Take 1 tablet (40 mg total) by mouth 2 (two) times daily.  . phenazopyridine (PYRIDIUM) 200 MG tablet Take 1 tablet (200 mg total) by mouth 3 (three) times daily as needed for pain.  . predniSONE (DELTASONE) 20 MG tablet Take by mouth.  . sildenafil (REVATIO) 20 MG tablet TAKE 1 TABLET BY MOUTH ONCE DAILY AS NEEDED  . sildenafil (VIAGRA) 50 MG tablet Take 1 to 2 tablets po QD prn  . sitaGLIPtin-metformin (JANUMET) 50-1000 MG tablet Take 1 tablet by mouth 2 (two) times daily.  . sucralfate (CARAFATE) 1 g tablet Take 1 tablet (1 g total) by mouth 4 (four) times daily -  with meals and at bedtime.  . [DISCONTINUED] azithromycin (ZITHROMAX) 250 MG tablet 2 tabs on day one, then 1 tab daily x 4 days  . [DISCONTINUED] cefdinir (OMNICEF) 300 MG capsule Take 1 capsule (300 mg total) by mouth 2 (two) times daily. (Patient not taking: Reported on 10/03/2018)   No facility-administered encounter medications on file as of 10/03/2018.     Surgical History: Past Surgical History:  Procedure Laterality Date  . CARDIAC CATHETERIZATION Left 08/24/2015   Procedure: Left Heart Cath and  Coronary Angiography;  Surgeon: Corey Skains, MD;  Location: [place] CV LAB;  Service: Cardiovascular;  Laterality: Left;  . COLONOSCOPY WITH PROPOFOL N/A 10/26/2014   Procedure: COLONOSCOPY WITH PROPOFOL;  Surgeon: Lollie Sails, MD;  Location: [facility] ENDOSCOPY;  Service: Endoscopy;  Laterality: N/A;  . ESOPHAGOGASTRODUODENOSCOPY (EGD) WITH PROPOFOL N/A 10/26/2014   Procedure: ESOPHAGOGASTRODUODENOSCOPY (EGD) WITH PROPOFOL;  Surgeon: Lollie Sails, MD;  Location[facility] ENDOSCOPY;  Service: Endoscopy;  Laterality: N/A;  . NECK SURGERY     c 6-7 due to stable fracture  . NISSEN FUNDOPLICATION    . UMBILICAL HERNIA  REPAIR  2001    Medical History: Past Medical History:  Diagnosis Date  . COPD (chronic obstructive pulmonary disease) ([place])   . Diabetes mellitus without complication ([place])     Family History: Family History  Problem Relation Age of Onset  . Diabetes Father     Social History   Socioeconomic History  . Marital status: Divorced    Spouse name: Not on file  . Number of children: Not on file  . Years of education: Not on file  . Highest education level: Not on file  Occupational History  . Not on file  Social Needs  . Financial resource strain: Not on file  . Food insecurity    Worry: Not on file    Inability: Not on file  . Transportation needs    Medical: Not on file    Non-medical: Not on file  Tobacco Use  . Smoking status: Former Smoker    Types: Cigarettes  . Smokeless tobacco: Never Used  Substance and Sexual Activity  . Alcohol use: Yes    Alcohol/week: 1.0 standard drinks    Types: 1 Cans of beer per week    Comment: occasionally  . Drug use: No  . Sexual activity: Not on file  Lifestyle  . Physical activity    Days per week: Not on file    Minutes per session: Not on file  . Stress: Not on file  Relationships  . Social Herbalist on phone: Not on file    Gets together: Not on file    Attends religious service: Not on file    Active member of club or organization: Not on file    Attends meetings of clubs or organizations: Not on file    Relationship status: Not on file  . Intimate partner violence    Fear of current or ex partner: Not on file    Emotionally abused: Not on file    Physically abused: Not on file    Forced sexual activity: Not on file  Other Topics Concern  . Not on file  Social History Narrative  . Not on file      Review of Systems  Constitutional: Negative.  Negative for chills, fatigue and unexpected weight change.  HENT: Negative.  Negative for congestion, rhinorrhea, sneezing and sore throat.   Eyes:  Negative for redness.  Respiratory: Negative.  Negative for cough, chest tightness and shortness of breath.   Cardiovascular: Negative.  Negative for chest pain and palpitations.  Gastrointestinal: Negative.  Negative for abdominal pain, constipation, diarrhea, nausea and vomiting.  Endocrine: Negative.   Genitourinary: Negative.  Negative for dysuria and frequency.  Musculoskeletal: Negative.  Negative for arthralgias, back pain, joint swelling and neck pain.  Skin: Negative.  Negative for rash.  Allergic/Immunologic: Negative.   Neurological: Negative.  Negative for tremors and numbness.  Hematological: Negative for  adenopathy. Does not bruise/bleed easily.  Psychiatric/Behavioral: Negative.  Negative for behavioral problems, sleep disturbance and suicidal ideas. The patient is not nervous/anxious.     Vital Signs: There were no vitals taken for this visit.   Observation/Objective:  Well sounding,  NAD noted.    Assessment/Plan: 1. COVID-19 virus infection Continue Z-pak and prednisone as directed.   2. Essential hypertension Stable, continue present management.   3. Uncontrolled type 2 diabetes mellitus with hyperglycemia ([place]) PTs blood sugar is elevated due ot prednisone, will follow up in a few weeks for physical.  - CBC with Differential/Platelet - Lipid Panel With LDL/HDL Ratio - TSH - T4, free - Comprehensive metabolic panel  General Counseling: Trayven verbalizes understanding of the findings of today's phone visit and agrees with plan of treatment. I have discussed any further diagnostic evaluation that may be needed or ordered today. We also reviewed his medications today. he has been encouraged to call the office with any questions or concerns that should arise related to todays visit.    No orders of the defined types were placed in this encounter.   No orders of the defined types were placed in this encounter.   Time spent: [place]  [facility] Internal medicine

## 2018-10-07 ENCOUNTER — Emergency Department: Payer: BC Managed Care – PPO

## 2018-10-07 ENCOUNTER — Telehealth: Payer: Self-pay

## 2018-10-07 ENCOUNTER — Encounter: Payer: BC Managed Care – PPO | Admitting: Adult Health

## 2018-10-07 ENCOUNTER — Encounter: Payer: Self-pay | Admitting: Emergency Medicine

## 2018-10-07 ENCOUNTER — Emergency Department
Admission: EM | Admit: 2018-10-07 | Discharge: 2018-10-07 | Disposition: A | Discharge status: Home / Self Care | Payer: BC Managed Care – PPO | Attending: Emergency Medicine | Admitting: Emergency Medicine

## 2018-10-07 ENCOUNTER — Other Ambulatory Visit: Payer: Self-pay

## 2018-10-07 DIAGNOSIS — E1165 Type 2 diabetes mellitus with hyperglycemia: Secondary | ICD-10-CM | POA: Insufficient documentation

## 2018-10-07 DIAGNOSIS — I10 Essential (primary) hypertension: Secondary | ICD-10-CM | POA: Insufficient documentation

## 2018-10-07 DIAGNOSIS — Z79899 Other long term (current) drug therapy: Secondary | ICD-10-CM | POA: Diagnosis not present

## 2018-10-07 DIAGNOSIS — I2510 Atherosclerotic heart disease of native coronary artery without angina pectoris: Secondary | ICD-10-CM | POA: Diagnosis not present

## 2018-10-07 DIAGNOSIS — J208 Acute bronchitis due to other specified organisms: Secondary | ICD-10-CM | POA: Insufficient documentation

## 2018-10-07 DIAGNOSIS — E119 Type 2 diabetes mellitus without complications: Secondary | ICD-10-CM | POA: Insufficient documentation

## 2018-10-07 DIAGNOSIS — Z87891 Personal history of nicotine dependence: Secondary | ICD-10-CM | POA: Diagnosis not present

## 2018-10-07 DIAGNOSIS — Z7984 Long term (current) use of oral hypoglycemic drugs: Secondary | ICD-10-CM | POA: Insufficient documentation

## 2018-10-07 DIAGNOSIS — R05 Cough: Secondary | ICD-10-CM | POA: Diagnosis not present

## 2018-10-07 DIAGNOSIS — R0602 Shortness of breath: Secondary | ICD-10-CM | POA: Diagnosis not present

## 2018-10-07 DIAGNOSIS — Z7982 Long term (current) use of aspirin: Secondary | ICD-10-CM | POA: Insufficient documentation

## 2018-10-07 DIAGNOSIS — J209 Acute bronchitis, unspecified: Secondary | ICD-10-CM | POA: Diagnosis not present

## 2018-10-07 DIAGNOSIS — J449 Chronic obstructive pulmonary disease, unspecified: Secondary | ICD-10-CM | POA: Diagnosis not present

## 2018-10-07 DIAGNOSIS — U071 COVID-19: Secondary | ICD-10-CM | POA: Diagnosis not present

## 2018-10-07 DIAGNOSIS — R509 Fever, unspecified: Secondary | ICD-10-CM | POA: Diagnosis not present

## 2018-10-07 LAB — BASIC METABOLIC PANEL
Anion gap: 11 (ref 5–15)
BUN: 17 mg/dL (ref 8–23)
CO2: 25 mmol/L (ref 22–32)
Calcium: 8.5 mg/dL — ABNORMAL LOW (ref 8.9–10.3)
Chloride: 97 mmol/L — ABNORMAL LOW (ref 98–111)
Creatinine, Ser: 1.02 mg/dL (ref 0.61–1.24)
GFR calc Af Amer: >60 mL/min (ref >60)
GFR calc non Af Amer: >60 mL/min (ref >60)
Glucose, Bld: 223 mg/dL — ABNORMAL HIGH (ref 70–99)
Potassium: 4.2 mmol/L (ref 3.5–5.1)
Sodium: 133 mmol/L — ABNORMAL LOW (ref 135–145)

## 2018-10-07 LAB — CBC
HCT: 44.5 % (ref 39.0–52.0)
Hemoglobin: 14.8 g/dL (ref 13.0–17.0)
MCH: 28.1 pg (ref 26.0–34.0)
MCHC: 33.3 g/dL (ref 30.0–36.0)
MCV: 84.6 fL (ref 80.0–100.0)
Platelets: 248 10*3/uL (ref 150–400)
RBC: 5.26 MIL/uL (ref 4.22–5.81)
RDW: 13.9 % (ref 11.5–15.5)
WBC: 7 10*3/uL (ref 4.0–10.5)
nRBC: 0 % (ref 0.0–0.2)

## 2018-10-07 LAB — TROPONIN I (HIGH SENSITIVITY): Troponin I (High Sensitivity): 5 ng/L (ref <18)

## 2018-10-07 MED ORDER — SODIUM CHLORIDE 0.9% FLUSH: 3.0000 mL | Freq: Once | INTRAVENOUS | Status: DC

## 2018-10-07 MED ORDER — BENZONATATE 100 MG PO CAPS: 100.0000 mg | ORAL_CAPSULE | Freq: Three times a day (TID) | ORAL | 0 refills | Status: AC | PRN

## 2018-10-07 NOTE — ED Provider Notes (Signed)
[facility] Emergency Department Provider Note  ____________________________________________  Time seen: Approximately 3:14 PM  I have reviewed the triage vital signs and the nursing notes.   HISTORY  Chief Complaint Cough and Shortness of Breath    HPI Ricardo Durham is a 63 y.o. male with a history of COPD diabetes and hypertension who comes to the ED complaining of generalized abdominal discomfort the past 4 days.  No vomiting or diarrhea.  No black or bloody stool.  Associated with 2 weeks of generalized weakness shortness of breath and nonproductive cough.  He reports being tested for COVID-19 on September 5 and had a positive result.  Symptoms are constant, not worsening, being managed by his primary care doctor, Dr. Humphrey Rolls.  He had previously been prescribed prednisone but was told to discontinue by his PCP due to his diabetes.     Past Medical History:  Diagnosis Date  . COPD (chronic obstructive pulmonary disease) ([place])   . Diabetes mellitus without complication [facility])      Patient Active Problem List   Diagnosis Date Noted  . Testicular hypofunction 07/04/2018  . Generalized osteoarthritis 04/14/2018  . Bladder spasm 04/14/2018  . Uncontrolled type 2 diabetes mellitus with hyperglycemia ([place]) 08/31/2017  . Cyst of skin and subcutaneous tissue 08/31/2017  . Essential hypertension 08/31/2017  . Mixed hyperlipidemia 08/31/2017  . Diastasis recti 12/07/2015  . Groin pain 12/07/2015  . CAD (coronary artery disease) 09/28/2015  . Benign essential HTN 09/28/2015  . Angina decubitus ([place]) 08/10/2015  . Abnormal finding on echocardiogram 08/09/2015  . Bilateral inguinal hernia without obstruction or gangrene 12/03/2014  . Colon polyp 09/17/2014  . Diabetes mellitus type 2, controlled ([place]) 09/17/2014  . Gastroesophageal reflux disease without esophagitis 09/17/2014     Past Surgical History:  Procedure Laterality Date  . CARDIAC CATHETERIZATION  Left 08/24/2015   Procedure: Left Heart Cath and Coronary Angiography;  Surgeon: Corey Skains, MD;  Location: [place] CV LAB;  Service: Cardiovascular;  Laterality: Left;  . COLONOSCOPY WITH PROPOFOL N/A 10/26/2014   Procedure: COLONOSCOPY WITH PROPOFOL;  Surgeon: Lollie Sails, MD;  Location: [facility] ENDOSCOPY;  Service: Endoscopy;  Laterality: N/A;  . ESOPHAGOGASTRODUODENOSCOPY (EGD) WITH PROPOFOL N/A 10/26/2014   Procedure: ESOPHAGOGASTRODUODENOSCOPY (EGD) WITH PROPOFOL;  Surgeon: Lollie Sails, MD;  Location: [facility] ENDOSCOPY;  Service: Endoscopy;  Laterality: N/A;  . NECK SURGERY     c 6-7 due to stable fracture  . NISSEN FUNDOPLICATION    . UMBILICAL HERNIA REPAIR  2001     Prior to Admission medications   Medication Sig Start Date End Date Taking? Authorizing Provider  albuterol (PROVENTIL) (2.5 MG/3ML) 0.083% nebulizer solution Take 3 mLs (2.5 mg total) by nebulization every 6 (six) hours as needed for wheezing or shortness of breath. 10/03/18   [name], NP  Allantoin 0.5 % LOTN Apply topically.    [provider]  ALOE VERA MOISTURIZING EX Apply topically.    [provider]  aspirin 81 MG tablet Take 81 mg by mouth every other day.    [provider]  atorvastatin (LIPITOR) 80 MG tablet Take 1 tablet (80 mg total) by mouth daily. 08/24/15   Corey Skains, MD  azithromycin (ZITHROMAX) 250 MG tablet Take as directed. 10/03/18   [name], NP  glucose blood (ACCU-CHEK AVIVA PLUS) test strip Use once daily for sugar e11.65 05/14/17   Ronnell Freshwater, NP  glyBURIDE (DIABETA) 5 MG tablet Take 1 tablet (5  mg total) by mouth 2 (two) times daily with a meal. 04/01/18   Boscia, Greer Ee, NP  ibuprofen (ADVIL) 800 MG tablet Take 1 tablet (800 mg total) by mouth every 8 (eight) hours as needed. 09/24/18   Boscia, Greer Ee, NP  INVOKANA 100 MG TABS tablet TAKE 1 TABLET (100 MG TOTAL) BY MOUTH DAILY BEFORE BREAKFAST. 07/19/18   [name],  NP  isosorbide mononitrate (IMDUR) 30 MG 24 hr tablet Take 30 mg by mouth daily.    [provider]  lisinopril (PRINIVIL,ZESTRIL) 5 MG tablet Take 5 mg by mouth daily.    [provider]  pantoprazole (PROTONIX) 40 MG tablet Take 1 tablet (40 mg total) by mouth 2 (two) times daily. 08/30/18   Ronnell Freshwater, NP  phenazopyridine (PYRIDIUM) 200 MG tablet Take 1 tablet (200 mg total) by mouth 3 (three) times daily as needed for pain. 04/01/18   Ronnell Freshwater, NP  sildenafil (REVATIO) 20 MG tablet TAKE 1 TABLET BY MOUTH ONCE DAILY AS NEEDED 07/08/18   Lavera Guise, MD  sildenafil (VIAGRA) 50 MG tablet Take 1 to 2 tablets po QD prn 07/04/18   Ronnell Freshwater, NP  sitaGLIPtin-metformin (JANUMET) 50-1000 MG tablet Take 1 tablet by mouth 2 (two) times daily. 08/19/18   Ronnell Freshwater, NP  sucralfate (CARAFATE) 1 g tablet Take 1 tablet (1 g total) by mouth 4 (four) times daily -  with meals and at bedtime. 10/01/18   Ronnell Freshwater, NP     Allergies Oxycodone   Family History  Problem Relation Age of Onset  . Diabetes Father     Social History Social History   Tobacco Use  . Smoking status: Former Smoker    Types: Cigarettes  . Smokeless tobacco: Never Used  Substance Use Topics  . Alcohol use: Yes    Alcohol/week: 1.0 standard drinks    Types: 1 Cans of beer per week    Comment: occasionally  . Drug use: No    Review of Systems  Constitutional: Positive fever without chills.  ENT:   No sore throat. No rhinorrhea. Cardiovascular:   No chest pain or syncope. Respiratory: Positive shortness of breath and nonproductive cough. Gastrointestinal:   Positive as above for abdominal pain without vomiting and diarrhea.  Musculoskeletal:   Negative for focal pain or swelling.  Positive muscle cramps All other systems reviewed and are negative except as documented above in ROS and HPI.  ____________________________________________   PHYSICAL EXAM:  VITAL  SIGNS: ED Triage Vitals  Enc Vitals Group     BP 10/07/18 1152 137/80     Pulse Rate 10/07/18 1152 84     Resp 10/07/18 1152 20     Temp 10/07/18 1152 (!) 100.4 F (38 C)     Temp Source 10/07/18 1152 Oral     SpO2 10/07/18 1152 94 %     Weight --      Height --      Head Circumference --      Peak Flow --      Pain Score 10/07/18 1153 8     Pain Loc --      Pain Edu? --      Excl. in [place]? --     Vital signs reviewed, nursing assessments reviewed.   Constitutional:   Alert and oriented. Non-toxic appearance. Eyes:   Conjunctivae are normal. EOMI. PERRL. ENT      Head:   Normocephalic and atraumatic.  Neck:   No meningismus. Full ROM. Hematological/Lymphatic/Immunilogical:   No cervical lymphadenopathy. Cardiovascular:   RRR. Symmetric bilateral radial and DP pulses.  No murmurs. Cap refill less than 2 seconds. Respiratory:   Normal respiratory effort without tachypnea/retractions. Breath sounds are clear and equal bilaterally. No wheezes/rales/rhonchi. Gastrointestinal:   Soft and nontender. Non distended. There is no CVA tenderness.  No rebound, rigidity, or guarding.  Musculoskeletal:   Normal range of motion in all extremities. No joint effusions.  No lower extremity tenderness.  No edema.  Symmetric calf circumference, negative Homans sign. Neurologic:   Normal speech and language.  Motor grossly intact. No acute focal neurologic deficits are appreciated.  Skin:    Skin is warm, dry and intact. No rash noted.  No petechiae, purpura, or bullae.  ____________________________________________    LABS (pertinent positives/negatives) (all labs ordered are listed, but only abnormal results are displayed) Labs Reviewed  BASIC METABOLIC PANEL - Abnormal; Notable for the following components:      Result Value   Sodium 133 (*)    Chloride 97 (*)    Glucose, Bld 223 (*)    Calcium 8.5 (*)    All other components within normal limits  CBC  TROPONIN I (HIGH  SENSITIVITY)  TROPONIN I (HIGH SENSITIVITY)   ____________________________________________   EKG  Interpreted by me Normal sinus rhythm rate of 91, left axis, poor R wave progression.  Normal ST segments and T waves.  No acute ischemic changes.   ____________________________________________    RADIOLOGY  Dg Chest Port 1 View  Result Date: 10/07/2018 CLINICAL DATA:  Worsening shortness of breath, cough, and fever. COVID-19 virus infection. EXAM: PORTABLE CHEST 1 VIEW COMPARISON:  09/03/2009 FINDINGS: The heart size and mediastinal contours are within normal limits. New opacity in the lateral left lung base may be due to atelectasis, infiltrate, or scarring. Lungs are otherwise clear. Aortic atherosclerosis. Cervical spine fusion hardware noted. IMPRESSION: New mild opacity in lateral left lung base, possibly due to atelectasis, infiltrate, or scarring. Electronically Signed   By: Marlaine Hind M.D.   On: 10/07/2018 14:59    ____________________________________________   PROCEDURES Procedures  ____________________________________________    CLINICAL IMPRESSION / ASSESSMENT AND PLAN / ED COURSE  Medications ordered in the ED: Medications  sodium chloride flush (NS) 0.9 % injection 3 mL (has no administration in time range)    Pertinent labs & imaging results that were available during my care of the patient were reviewed by me and considered in my medical decision making (see chart for details).  Ricardo Durham was evaluated in Emergency Department on 10/07/2018 for the symptoms described in the history of present illness. He was evaluated in the context of the global COVID-19 pandemic, which necessitated consideration that the patient might be at risk for infection with the SARS-CoV-2 virus that causes COVID-19. Institutional protocols and algorithms that pertain to the evaluation of patients at risk for COVID-19 are in a state of rapid change based on information released by  regulatory bodies including the CDC and federal and state organizations. These policies and algorithms were followed during the patient's care in the ED.   Patient presents with respiratory symptoms and now vague abdominal discomfort in the setting of known COVID-19 infection.  He is mildly hyperglycemic due to his diabetes and acute inflammatory reaction.  Not dehydrated, no evidence of acidosis.  Good air movement in all lung fields, oxygenation is adequate, other vital signs are reassuring and labs are reassuring.  Symptoms  are noncardiac.   Recommended he continue his medications and continue following up with his primary care doctor, return precautions for severe shortness of breath, dehydration or other worsening symptoms.  Otherwise stable to continue quarantining at home.      ____________________________________________   FINAL CLINICAL IMPRESSION(S) / ED DIAGNOSES    Final diagnoses:  COVID-19 virus infection  Acute bronchitis, unspecified organism     ED Discharge Orders    None      Portions of this note were generated with dragon dictation software. Dictation errors may occur despite best attempts at proofreading.   Carrie Mew, MD 10/07/18 1517

## 2018-10-07 NOTE — ED Notes (Signed)
Covid +, states having a hard time catching his breath. Placed in Covid appropriate area while waiting for triage, no SHOB noted, speaking in full sentences.

## 2018-10-07 NOTE — ED Notes (Signed)
PT reports abdominal discomfort for last 3-4 days.

## 2018-10-07 NOTE — ED Triage Notes (Addendum)
Cough x 9 days. In triage patient states feels weak and SOB. VS as noted.

## 2018-10-07 NOTE — Telephone Encounter (Signed)
Pt called that he getting worse trouble breathing worse and muscle spasms and was diagnosis with positive for  covid by urgent care  as per adam advised need to go ED

## 2018-10-07 NOTE — ED Notes (Signed)
Sent rainbow to the lab. 

## 2018-10-09 ENCOUNTER — Telehealth: Payer: Self-pay

## 2018-10-09 NOTE — Telephone Encounter (Signed)
PT CALLED STATING THAT HE IS HAVING FEVERS, CHILLS, NASAL DRAINAGE, AND CONSISTENT COUGHING WITH CLEAR MUCOUS. COUGH HAS BEEN MAKING HIS RIBS HURT. PT WAS CONCERNED ABOUT HIS COUGH AND WAS GIVEN TESSALON PEARLS ON 10/07/2018 BUT HAS NOT BEGAN TAKING MEDICATION.   SPOKE WITH DR. Clayborn Bigness AND ADAM  ADVISED PT THAT HE WILL NEED TO BE SEEN AS A VIDEO VISIT AND APPT IS SCHEDULED TOMORROW 10/10/2018. MADE SURE PT IS NOT HAVING SEVERE SOB AND PT STATED THAT HE IS NOT AND MADE SURE THAT PT TAKES HIS TESSALON PEARLS TO HELP WITH COUGH AND IF HE HAD ANY CONCERNS TO GIVE [place] A CALL BACK.

## 2018-10-10 ENCOUNTER — Encounter: Payer: Self-pay | Admitting: Adult Health

## 2018-10-10 ENCOUNTER — Other Ambulatory Visit: Payer: Self-pay

## 2018-10-10 ENCOUNTER — Telehealth: Payer: Self-pay

## 2018-10-10 ENCOUNTER — Ambulatory Visit (INDEPENDENT_AMBULATORY_CARE_PROVIDER_SITE_OTHER): Payer: BC Managed Care – PPO | Admitting: Adult Health

## 2018-10-10 VITALS — Temp 96.9°F | Wt 210.0 lb

## 2018-10-10 DIAGNOSIS — U071 COVID-19: Secondary | ICD-10-CM

## 2018-10-10 DIAGNOSIS — I10 Essential (primary) hypertension: Secondary | ICD-10-CM | POA: Diagnosis not present

## 2018-10-10 DIAGNOSIS — J449 Chronic obstructive pulmonary disease, unspecified: Secondary | ICD-10-CM

## 2018-10-10 MED ORDER — PREDNISONE 10 MG PO TABS: ORAL_TABLET | ORAL | 0 refills | Status: DC

## 2018-10-10 MED ORDER — LEVOFLOXACIN 500 MG PO TABS: 500.0000 mg | ORAL_TABLET | Freq: Every day | ORAL | 0 refills | Status: AC

## 2018-10-10 NOTE — Telephone Encounter (Signed)
Pt called back for work note as per adam pt advised stay home for 14 days and need retest in 10 days  For covid

## 2018-10-10 NOTE — Progress Notes (Signed)
[facility] [place], [place] [address]  Internal MEDICINE  Telephone Visit  Patient Name: Ricardo Durham  [id number]  [id number]  Date of Service: 10/20/2018  I connected with the patient at 1056 by telephone and verified the patients identity using two identifiers.   I discussed the limitations, risks, security and privacy concerns of performing an evaluation and management service by telephone and the availability of in person appointments. I also discussed with the patient that there may be a patient responsible charge related to the service.  The patient expressed understanding and agrees to proceed.    Chief Complaint  Patient presents with  . Telephone Assessment  . Telephone Screen  . Cough    chest congestion  . Shortness of Breath  . Night Sweats  . Spasms  . Anxiety    HPI  PT seen today due to continued symptoms of covid-19. HE reports the last few days have been very bad, however today he feels slightly better.  HE has not had a fever.  HE continues to cough, and feel chest tightness. Denies wheezing, hemopysis or other issues.    Current Medication: Outpatient Encounter Medications as of 10/10/2018  Medication Sig  . albuterol (PROVENTIL) (2.5 MG/3ML) 0.083% nebulizer solution Take 3 mLs (2.5 mg total) by nebulization every 6 (six) hours as needed for wheezing or shortness of breath.  . Allantoin 0.5 % LOTN Apply topically.  . ALOE VERA MOISTURIZING EX Apply topically.  Marland Kitchen aspirin 81 MG tablet Take 81 mg by mouth every other day.  Marland Kitchen atorvastatin (LIPITOR) 80 MG tablet Take 1 tablet (80 mg total) by mouth daily.  Marland Kitchen azithromycin (ZITHROMAX) 250 MG tablet Take as directed.  . benzonatate (TESSALON PERLES) 100 MG capsule Take 1 capsule (100 mg total) by mouth 3 (three) times daily as needed for cough.  Marland Kitchen glucose blood (ACCU-CHEK AVIVA PLUS) test strip Use once daily for sugar e11.65  . glyBURIDE (DIABETA) 5 MG tablet Take 1 tablet (5 mg total)  by mouth 2 (two) times daily with a meal.  . ibuprofen (ADVIL) 800 MG tablet Take 1 tablet (800 mg total) by mouth every 8 (eight) hours as needed.  . INVOKANA 100 MG TABS tablet TAKE 1 TABLET (100 MG TOTAL) BY MOUTH DAILY BEFORE BREAKFAST.  Marland Kitchen isosorbide mononitrate (IMDUR) 30 MG 24 hr tablet Take 30 mg by mouth daily.  Marland Kitchen lisinopril (PRINIVIL,ZESTRIL) 5 MG tablet Take 5 mg by mouth daily.  . pantoprazole (PROTONIX) 40 MG tablet Take 1 tablet (40 mg total) by mouth 2 (two) times daily.  . phenazopyridine (PYRIDIUM) 200 MG tablet Take 1 tablet (200 mg total) by mouth 3 (three) times daily as needed for pain.  . sildenafil (REVATIO) 20 MG tablet TAKE 1 TABLET BY MOUTH ONCE DAILY AS NEEDED  . sildenafil (VIAGRA) 50 MG tablet Take 1 to 2 tablets po QD prn  . sitaGLIPtin-metformin (JANUMET) 50-1000 MG tablet Take 1 tablet by mouth 2 (two) times daily.  . sucralfate (CARAFATE) 1 g tablet Take 1 tablet (1 g total) by mouth 4 (four) times daily -  with meals and at bedtime.  Marland Kitchen levofloxacin (LEVAQUIN) 500 MG tablet Take 1 tablet (500 mg total) by mouth daily for 10 days.  . predniSONE (DELTASONE) 10 MG tablet Use per dose pack   No facility-administered encounter medications on file as of 10/10/2018.     Surgical History: Past Surgical History:  Procedure Laterality Date  . CARDIAC CATHETERIZATION Left 08/24/2015  Procedure: Left Heart Cath and Coronary Angiography;  Surgeon: Corey Skains, MD;  Location: [place] CV LAB;  Service: Cardiovascular;  Laterality: Left;  . COLONOSCOPY WITH PROPOFOL N/A 10/26/2014   Procedure: COLONOSCOPY WITH PROPOFOL;  Surgeon: Lollie Sails, MD;  Location: [facility] ENDOSCOPY;  Service: Endoscopy;  Laterality: N/A;  . ESOPHAGOGASTRODUODENOSCOPY (EGD) WITH PROPOFOL N/A 10/26/2014   Procedure: ESOPHAGOGASTRODUODENOSCOPY (EGD) WITH PROPOFOL;  Surgeon: Lollie Sails, MD;  Location: [facility] ENDOSCOPY;  Service: Endoscopy;  Laterality: N/A;  . NECK SURGERY     c 6-7 due  to stable fracture  . NISSEN FUNDOPLICATION    . UMBILICAL HERNIA REPAIR  2001    Medical History: Past Medical History:  Diagnosis Date  . COPD (chronic obstructive pulmonary disease) ([place])   . Diabetes mellitus without complication ([place])     Family History: Family History  Problem Relation Age of Onset  . Diabetes Father     Social History   Socioeconomic History  . Marital status: Divorced    Spouse name: Not on file  . Number of children: Not on file  . Years of education: Not on file  . Highest education level: Not on file  Occupational History  . Not on file  Social Needs  . Financial resource strain: Not on file  . Food insecurity    Worry: Not on file    Inability: Not on file  . Transportation needs    Medical: Not on file    Non-medical: Not on file  Tobacco Use  . Smoking status: Former Smoker    Types: Cigarettes  . Smokeless tobacco: Never Used  Substance and Sexual Activity  . Alcohol use: Yes    Alcohol/week: 1.0 standard drinks    Types: 1 Cans of beer per week    Comment: occasionally  . Drug use: No  . Sexual activity: Not on file  Lifestyle  . Physical activity    Days per week: Not on file    Minutes per session: Not on file  . Stress: Not on file  Relationships  . Social Herbalist on phone: Not on file    Gets together: Not on file    Attends religious service: Not on file    Active member of club or organization: Not on file    Attends meetings of clubs or organizations: Not on file    Relationship status: Not on file  . Intimate partner violence    Fear of current or ex partner: Not on file    Emotionally abused: Not on file    Physically abused: Not on file    Forced sexual activity: Not on file  Other Topics Concern  . Not on file  Social History Narrative  . Not on file      Review of Systems  Constitutional: Negative.  Negative for chills, fatigue and unexpected weight change.  HENT: Negative.  Negative  for congestion, rhinorrhea, sneezing and sore throat.   Eyes: Negative for redness.  Respiratory: Negative.  Negative for cough, chest tightness and shortness of breath.   Cardiovascular: Negative.  Negative for chest pain and palpitations.  Gastrointestinal: Negative.  Negative for abdominal pain, constipation, diarrhea, nausea and vomiting.  Endocrine: Negative.   Genitourinary: Negative.  Negative for dysuria and frequency.  Musculoskeletal: Negative.  Negative for arthralgias, back pain, joint swelling and neck pain.  Skin: Negative.  Negative for rash.  Allergic/Immunologic: Negative.   Neurological: Negative.  Negative for tremors and  numbness.  Hematological: Negative for adenopathy. Does not bruise/bleed easily.  Psychiatric/Behavioral: Negative.  Negative for behavioral problems, sleep disturbance and suicidal ideas. The patient is not nervous/anxious.     Vital Signs: Temp (!) 96.9 F (36.1 C)   Wt 210 lb (95.3 kg)   BMI 29.29 kg/m    Observation/Objective: Appears well, he is coughing, intermittently.  No distress currently.    Assessment/Plan: 1. COVID-19 virus infection Advised patient to take entire course of antibiotics as prescribed with food. Pt should return to clinic in 7-10 days if symptoms fail to improve or new symptoms develop.  - levofloxacin (LEVAQUIN) 500 MG tablet; Take 1 tablet (500 mg total) by mouth daily for 10 days.  Dispense: 10 tablet; Refill: 0 - predniSONE (DELTASONE) 10 MG tablet; Use per dose pack  Dispense: 21 tablet; Refill: 0  2. Chronic obstructive pulmonary disease, unspecified COPD type ([place]) Stable continue use medications as prescribed.  3. Essential hypertension No BP today due to virtual visit.  Patient reports blood pressure doing well denies any headaches, blurred vision, chest pain or dizziness.  Continue present management.  General Counseling: cardell tae understanding of the findings of today's phone visit and agrees  with plan of treatment. I have discussed any further diagnostic evaluation that may be needed or ordered today. We also reviewed his medications today. he has been encouraged to call the office with any questions or concerns that should arise related to todays visit.    No orders of the defined types were placed in this encounter.   Meds ordered this encounter  Medications  . levofloxacin (LEVAQUIN) 500 MG tablet    Sig: Take 1 tablet (500 mg total) by mouth daily for 10 days.    Dispense:  10 tablet    Refill:  0  . predniSONE (DELTASONE) 10 MG tablet    Sig: Use per dose pack    Dispense:  21 tablet    Refill:  0    Time spent: [place] AGNP-C Internal medicine

## 2018-10-16 ENCOUNTER — Telehealth: Payer: Self-pay

## 2018-10-16 NOTE — Telephone Encounter (Signed)
Advised pt to drop off short term disability papers in mailbox and we will complete those and call once ready for pick up. Per pt still not feeling well with hot flashes and shaking and trouble sleeping from being positive from covid, I explained to pt I will do forms for him for his job. Beth

## 2018-10-21 ENCOUNTER — Telehealth: Payer: Self-pay

## 2018-10-21 NOTE — Telephone Encounter (Signed)
Patient return work note ready for pick up. Ricardo Durham

## 2018-10-22 ENCOUNTER — Telehealth: Payer: Self-pay

## 2018-10-22 DIAGNOSIS — E1165 Type 2 diabetes mellitus with hyperglycemia: Secondary | ICD-10-CM | POA: Diagnosis not present

## 2018-10-22 NOTE — Telephone Encounter (Signed)
RECEIVED REQUEST FOR RECORDS FROM PATIENTS EMPLOYER FOR SHORT TERM DISABILITY. FAXED REQUESTED RECORDS.

## 2018-10-23 LAB — CBC WITH DIFFERENTIAL/PLATELET
Basophils Absolute: 0.1 10*3/uL (ref 0.0–0.2)
Basos: 1 %
EOS (ABSOLUTE): 0.1 10*3/uL (ref 0.0–0.4)
Eos: 1 %
Hematocrit: 43.7 % (ref 37.5–51.0)
Hemoglobin: 14.8 g/dL (ref 13.0–17.7)
Immature Grans (Abs): 0.1 10*3/uL (ref 0.0–0.1)
Immature Granulocytes: 1 %
Lymphocytes Absolute: 2.1 10*3/uL (ref 0.7–3.1)
Lymphs: 21 %
MCH: 28.7 pg (ref 26.6–33.0)
MCHC: 33.9 g/dL (ref 31.5–35.7)
MCV: 85 fL (ref 79–97)
Monocytes Absolute: 0.8 10*3/uL (ref 0.1–0.9)
Monocytes: 8 %
Neutrophils Absolute: 6.7 10*3/uL (ref 1.4–7.0)
Neutrophils: 68 %
Platelets: 470 10*3/uL — ABNORMAL HIGH (ref 150–450)
RBC: 5.15 x10E6/uL (ref 4.14–5.80)
RDW: 13.7 % (ref 11.6–15.4)
WBC: 9.9 10*3/uL (ref 3.4–10.8)

## 2018-10-23 LAB — LIPID PANEL WITH LDL/HDL RATIO
Cholesterol, Total: 163 mg/dL (ref 100–199)
HDL: 46 mg/dL (ref >39)
LDL Chol Calc (NIH): 95 mg/dL (ref 0–99)
LDL/HDL Ratio: 2.1 ratio (ref 0.0–3.6)
Triglycerides: 123 mg/dL (ref 0–149)
VLDL Cholesterol Cal: 22 mg/dL (ref 5–40)

## 2018-10-23 LAB — COMPREHENSIVE METABOLIC PANEL
ALT: 26 IU/L (ref 0–44)
AST: 18 IU/L (ref 0–40)
Albumin/Globulin Ratio: 1.5 (ref 1.2–2.2)
Albumin: 4.3 g/dL (ref 3.8–4.8)
Alkaline Phosphatase: 79 IU/L (ref 39–117)
BUN/Creatinine Ratio: 21 (ref 10–24)
BUN: 26 mg/dL (ref 8–27)
Bilirubin Total: 0.5 mg/dL (ref 0.0–1.2)
CO2: 21 mmol/L (ref 20–29)
Calcium: 9.9 mg/dL (ref 8.6–10.2)
Chloride: 97 mmol/L (ref 96–106)
Creatinine, Ser: 1.24 mg/dL (ref 0.76–1.27)
GFR calc Af Amer: 72 mL/min/{1.73_m2} (ref >59)
GFR calc non Af Amer: 62 mL/min/{1.73_m2} (ref >59)
Globulin, Total: 2.9 g/dL (ref 1.5–4.5)
Glucose: 165 mg/dL — ABNORMAL HIGH (ref 65–99)
Potassium: 5.4 mmol/L — ABNORMAL HIGH (ref 3.5–5.2)
Sodium: 135 mmol/L (ref 134–144)
Total Protein: 7.2 g/dL (ref 6.0–8.5)

## 2018-10-23 LAB — T4, FREE: Free T4: 1.52 ng/dL (ref 0.82–1.77)

## 2018-10-23 LAB — TSH: TSH: 1.98 u[IU]/mL (ref 0.450–4.500)

## 2018-10-25 ENCOUNTER — Encounter: Payer: Self-pay | Admitting: Nurse Practitioner

## 2018-10-27 ENCOUNTER — Other Ambulatory Visit: Payer: Self-pay | Admitting: Adult Health

## 2018-10-27 DIAGNOSIS — E1165 Type 2 diabetes mellitus with hyperglycemia: Secondary | ICD-10-CM

## 2018-10-30 ENCOUNTER — Encounter: Payer: Self-pay | Admitting: Adult Health

## 2018-10-30 ENCOUNTER — Ambulatory Visit (INDEPENDENT_AMBULATORY_CARE_PROVIDER_SITE_OTHER): Payer: BC Managed Care – PPO | Admitting: Adult Health

## 2018-10-30 ENCOUNTER — Other Ambulatory Visit: Payer: Self-pay

## 2018-10-30 VITALS — BP 118/80 | HR 88 | Temp 97.4°F | Resp 16 | Ht 71.0 in

## 2018-10-30 DIAGNOSIS — E782 Mixed hyperlipidemia: Secondary | ICD-10-CM

## 2018-10-30 DIAGNOSIS — R300 Dysuria: Secondary | ICD-10-CM

## 2018-10-30 DIAGNOSIS — K219 Gastro-esophageal reflux disease without esophagitis: Secondary | ICD-10-CM

## 2018-10-30 DIAGNOSIS — Z0001 Encounter for general adult medical examination with abnormal findings: Secondary | ICD-10-CM

## 2018-10-30 DIAGNOSIS — E1165 Type 2 diabetes mellitus with hyperglycemia: Secondary | ICD-10-CM

## 2018-10-30 DIAGNOSIS — I10 Essential (primary) hypertension: Secondary | ICD-10-CM | POA: Diagnosis not present

## 2018-10-30 DIAGNOSIS — E875 Hyperkalemia: Secondary | ICD-10-CM

## 2018-10-30 NOTE — Progress Notes (Addendum)
[facility] [place], [place] [address]  Internal MEDICINE  Office Visit Note  Patient Name: Ricardo Durham  [id number]  [id number]  Date of Service: 12/04/2018  Chief Complaint  Patient presents with  . Annual Exam  . Diabetes  . Quality Metric Gaps    diabetic foot exam     HPI Pt is here for routine health maintenance examination.  He is a well appearing 63 yo male.  He has recently recovered from Covid.  Due to the steroids from covid, his A1C is elevated to 9.1 today.  He Is back to work and doing well currently. He is in need of a Diabetic foot exam, which will be conducted today.  His diet has not changed, and he expects his A1C to improve with time, once the effects of the steroids have resolved.    Current Medication: Outpatient Encounter Medications as of 10/30/2018  Medication Sig  . albuterol (PROVENTIL) (2.5 MG/3ML) 0.083% nebulizer solution Take 3 mLs (2.5 mg total) by nebulization every 6 (six) hours as needed for wheezing or shortness of breath.  . Allantoin 0.5 % LOTN Apply topically.  . ALOE VERA MOISTURIZING EX Apply topically.  Marland Kitchen aspirin 81 MG tablet Take 81 mg by mouth every other day.  Marland Kitchen atorvastatin (LIPITOR) 80 MG tablet Take 1 tablet (80 mg total) by mouth daily.  . benzonatate (TESSALON PERLES) 100 MG capsule Take 1 capsule (100 mg total) by mouth 3 (three) times daily as needed for cough.  Marland Kitchen glucose blood (ACCU-CHEK AVIVA PLUS) test strip Use once daily for sugar e11.65  . glyBURIDE (DIABETA) 5 MG tablet Take 1 tablet (5 mg total) by mouth 2 (two) times daily with a meal.  . INVOKANA 100 MG TABS tablet TAKE 1 TABLET (100 MG TOTAL) BY MOUTH DAILY BEFORE BREAKFAST.  Marland Kitchen isosorbide mononitrate (IMDUR) 30 MG 24 hr tablet Take 30 mg by mouth daily.  Marland Kitchen lisinopril (PRINIVIL,ZESTRIL) 5 MG tablet Take 5 mg by mouth daily.  . pantoprazole (PROTONIX) 40 MG tablet Take 1 tablet (40 mg total) by mouth 2 (two) times daily.  . phenazopyridine  (PYRIDIUM) 200 MG tablet Take 1 tablet (200 mg total) by mouth 3 (three) times daily as needed for pain.  . predniSONE (DELTASONE) 10 MG tablet Use per dose pack  . sildenafil (REVATIO) 20 MG tablet TAKE 1 TABLET BY MOUTH ONCE DAILY AS NEEDED  . sildenafil (VIAGRA) 50 MG tablet Take 1 to 2 tablets po QD prn  . sitaGLIPtin-metformin (JANUMET) 50-1000 MG tablet Take 1 tablet by mouth 2 (two) times daily.  . [DISCONTINUED] azithromycin (ZITHROMAX) 250 MG tablet Take as directed.  . [DISCONTINUED] ibuprofen (ADVIL) 800 MG tablet Take 1 tablet (800 mg total) by mouth every 8 (eight) hours as needed.  . [DISCONTINUED] sucralfate (CARAFATE) 1 g tablet Take 1 tablet (1 g total) by mouth 4 (four) times daily -  with meals and at bedtime.   No facility-administered encounter medications on file as of 10/30/2018.     Surgical History: Past Surgical History:  Procedure Laterality Date  . CARDIAC CATHETERIZATION Left 08/24/2015   Procedure: Left Heart Cath and Coronary Angiography;  Surgeon: Corey Skains, MD;  Location: [place] [facility];  Service: Cardiovascular;  Laterality: Left;  . COLONOSCOPY WITH PROPOFOL N/A 10/26/2014   Procedure: COLONOSCOPY WITH PROPOFOL;  Surgeon: Lollie Sails, MD;  Location: [facility] ENDOSCOPY;  Service: Endoscopy;  Laterality: N/A;  . ESOPHAGOGASTRODUODENOSCOPY (EGD) WITH PROPOFOL N/A 10/26/2014  Procedure: ESOPHAGOGASTRODUODENOSCOPY (EGD) WITH PROPOFOL;  Surgeon: Lollie Sails, MD;  Location: [facility] ENDOSCOPY;  Service: Endoscopy;  Laterality: N/A;  . NECK SURGERY     c 6-7 due to stable fracture  . NISSEN FUNDOPLICATION    . UMBILICAL HERNIA REPAIR  2001    Medical History: Past Medical History:  Diagnosis Date  . COPD (chronic obstructive pulmonary disease) ([place])   . Diabetes mellitus without complication ([place])     Family History: Family History  Problem Relation Age of Onset  . Diabetes Father       Review of Systems  Constitutional: Negative.   Negative for chills, fatigue and unexpected weight change.  HENT: Negative.  Negative for congestion, rhinorrhea, sneezing and sore throat.   Eyes: Negative for redness.  Respiratory: Negative.  Negative for cough, chest tightness and shortness of breath.   Cardiovascular: Negative.  Negative for chest pain and palpitations.  Gastrointestinal: Negative.  Negative for abdominal pain, constipation, diarrhea, nausea and vomiting.  Endocrine: Negative.   Genitourinary: Negative.  Negative for dysuria and frequency.  Musculoskeletal: Negative.  Negative for arthralgias, back pain, joint swelling and neck pain.  Skin: Negative.  Negative for rash.  Allergic/Immunologic: Negative.   Neurological: Negative.  Negative for tremors and numbness.  Hematological: Negative for adenopathy. Does not bruise/bleed easily.  Psychiatric/Behavioral: Negative.  Negative for behavioral problems, sleep disturbance and suicidal ideas. The patient is not nervous/anxious.      Vital Signs: BP 118/80   Pulse 88   Temp (!) 97.4 F (36.3 C)   Resp 16   Ht 5\' 11"  (1.803 m)   SpO2 99%   BMI 29.29 kg/m    Physical Exam Vitals signs and nursing note reviewed.  Constitutional:      General: He is not in acute distress.    Appearance: He is well-developed. He is not diaphoretic.  HENT:     Head: Normocephalic and atraumatic.     Mouth/Throat:     Pharynx: No oropharyngeal exudate.  Eyes:     Pupils: Pupils are equal, round, and reactive to light.  Neck:     Musculoskeletal: Normal range of motion and neck supple.     Thyroid: No thyromegaly.     Vascular: No JVD.     Trachea: No tracheal deviation.  Cardiovascular:     Rate and Rhythm: Normal rate and regular rhythm.     Heart sounds: Normal heart sounds. No murmur. No friction rub. No gallop.   Pulmonary:     Effort: Pulmonary effort is normal. No respiratory distress.     Breath sounds: Normal breath sounds. No wheezing or rales.  Chest:     Chest  wall: No tenderness.  Abdominal:     Palpations: Abdomen is soft.     Tenderness: There is no abdominal tenderness. There is no guarding.  Musculoskeletal: Normal range of motion.  Lymphadenopathy:     Cervical: No cervical adenopathy.  Skin:    General: Skin is warm and dry.  Neurological:     Mental Status: He is alert and oriented to person, place, and time.     Cranial Nerves: No cranial nerve deficit.  Psychiatric:        Behavior: Behavior normal.        Thought Content: Thought content normal.        Judgment: Judgment normal.      LABS: Recent Results (from the past 2160 hour(s))  Basic metabolic panel     Status: Abnormal  Collection Time: 10/07/18 12:10 PM  Result Value Ref Range   Sodium 133 (L) 135 - 145 mmol/L   Potassium 4.2 3.5 - 5.1 mmol/L   Chloride 97 (L) 98 - 111 mmol/L   CO2 25 22 - 32 mmol/L   Glucose, Bld 223 (H) 70 - 99 mg/dL   BUN 17 8 - 23 mg/dL   Creatinine, Ser 1.02 0.61 - 1.24 mg/dL   Calcium 8.5 (L) 8.9 - 10.3 mg/dL   GFR calc non Af Amer >60 >60 mL/min   GFR calc Af Amer >60 >60 mL/min   Anion gap 11 5 - 15    Comment: Performed at [facility], [address] [place], [place] [address]  CBC     Status: None   Collection Time: 10/07/18 12:10 PM  Result Value Ref Range   WBC 7.0 4.0 - 10.5 K/uL   RBC 5.26 4.22 - 5.81 MIL/uL   Hemoglobin 14.8 13.0 - 17.0 g/dL   HCT 44.5 39.0 - 52.0 %   MCV 84.6 80.0 - 100.0 fL   MCH 28.1 26.0 - 34.0 pg   MCHC 33.3 30.0 - 36.0 g/dL   RDW 13.9 11.5 - 15.5 %   Platelets 248 150 - 400 K/uL   nRBC 0.0 0.0 - 0.2 %    Comment: Performed at [facility], [place], [place] [address]  Troponin I (High Sensitivity)     Status: None   Collection Time: 10/07/18 12:10 PM  Result Value Ref Range   Troponin I (High Sensitivity) 5 <18 ng/L    Comment: (NOTE) Elevated high sensitivity troponin I (hsTnI) values and significant  changes across serial measurements may suggest ACS but  many other  chronic and acute conditions are known to elevate hsTnI results.  Refer to the "Links" section for chest pain algorithms and additional  guidance. Performed at [facility], [address] [place], [place] [address]   CBC with Differential/Platelet     Status: Abnormal   Collection Time: 10/22/18 12:03 PM  Result Value Ref Range   WBC 9.9 3.4 - 10.8 x10E3/uL   RBC 5.15 4.14 - 5.80 x10E6/uL   Hemoglobin 14.8 13.0 - 17.7 g/dL   Hematocrit 43.7 37.5 - 51.0 %   MCV 85 79 - 97 fL   MCH 28.7 26.6 - 33.0 pg   MCHC 33.9 31.5 - 35.7 g/dL   RDW 13.7 11.6 - 15.4 %   Platelets 470 (H) 150 - 450 x10E3/uL   Neutrophils 68 Not Estab. %   Lymphs 21 Not Estab. %   Monocytes 8 Not Estab. %   Eos 1 Not Estab. %   Basos 1 Not Estab. %   Neutrophils Absolute 6.7 1.4 - 7.0 x10E3/uL   Lymphocytes Absolute 2.1 0.7 - 3.1 x10E3/uL   Monocytes Absolute 0.8 0.1 - 0.9 x10E3/uL   EOS (ABSOLUTE) 0.1 0.0 - 0.4 x10E3/uL   Basophils Absolute 0.1 0.0 - 0.2 x10E3/uL   Immature Granulocytes 1 Not Estab. %   Immature Grans (Abs) 0.1 0.0 - 0.1 x10E3/uL  Lipid Panel With LDL/HDL Ratio     Status: None   Collection Time: 10/22/18 12:03 PM  Result Value Ref Range   Cholesterol, Total 163 100 - 199 mg/dL   Triglycerides 123 0 - 149 mg/dL   HDL 46 >39 mg/dL   VLDL Cholesterol Cal 22 5 - 40 mg/dL   LDL Chol Calc (NIH) 95 0 - 99 mg/dL   LDL/HDL Ratio  2.1 0.0 - 3.6 ratio    Comment:                                     LDL/HDL Ratio                                             Men  Women                               1/2 Avg.Risk  1.0    1.5                                   Avg.Risk  3.6    3.2                                2X Avg.Risk  6.2    5.0                                3X Avg.Risk  8.0    6.1   TSH     Status: None   Collection Time: 10/22/18 12:03 PM  Result Value Ref Range   TSH 1.980 0.450 - 4.500 uIU/mL  T4, free     Status: None   Collection Time: 10/22/18 12:03 PM  Result  Value Ref Range   Free T4 1.52 0.82 - 1.77 ng/dL  Comprehensive metabolic panel     Status: Abnormal   Collection Time: 10/22/18 12:03 PM  Result Value Ref Range   Glucose 165 (H) 65 - 99 mg/dL   BUN 26 8 - 27 mg/dL   Creatinine, Ser 1.24 0.76 - 1.27 mg/dL   GFR calc non Af Amer 62 >59 mL/min/1.73   GFR calc Af Amer 72 >59 mL/min/1.73   BUN/Creatinine Ratio 21 10 - 24   Sodium 135 134 - 144 mmol/L   Potassium 5.4 (H) 3.5 - 5.2 mmol/L   Chloride 97 96 - 106 mmol/L   CO2 21 20 - 29 mmol/L   Calcium 9.9 8.6 - 10.2 mg/dL   Total Protein 7.2 6.0 - 8.5 g/dL   Albumin 4.3 3.8 - 4.8 g/dL   Globulin, Total 2.9 1.5 - 4.5 g/dL   Albumin/Globulin Ratio 1.5 1.2 - 2.2   Bilirubin Total 0.5 0.0 - 1.2 mg/dL   Alkaline Phosphatase 79 39 - 117 IU/L   AST 18 0 - 40 IU/L   ALT 26 0 - 44 IU/L  UA/M w/rflx Culture, Routine     Status: Abnormal   Collection Time: 10/30/18  2:10 PM   Specimen: Urine   URINE  Result Value Ref Range   Specific Gravity, UA      >=1.030 (A) 1.005 - 1.030   pH, UA 5.0 5.0 - 7.5   Color, UA Yellow Yellow   Appearance Ur Clear Clear   Leukocytes,UA Negative Negative   Protein,UA Negative Negative/Trace   Glucose, UA 3+ (A) Negative   Ketones, UA Negative Negative   RBC, UA Negative Negative   Bilirubin, UA Negative Negative   Urobilinogen, Ur  0.2 0.2 - 1.0 mg/dL   Nitrite, UA Negative Negative   Microscopic Examination Comment     Comment: Microscopic follows if indicated.   Microscopic Examination See below:     Comment: Microscopic was indicated and was performed.   Urinalysis Reflex Comment     Comment: This specimen will not reflex to a Urine Culture.  Microscopic Examination     Status: None   Collection Time: 10/30/18  2:10 PM   URINE  Result Value Ref Range   WBC, UA 0-5 0 - 5 /hpf   RBC 0-2 0 - 2 /hpf   Epithelial Cells (non renal) None seen 0 - 10 /hpf   Casts None seen None seen /lpf   Mucus, UA Present Not Estab.   Bacteria, UA None seen None  seen/Few  POCT HgB A1C     Status: Abnormal   Collection Time: 11/28/18  8:54 AM  Result Value Ref Range   Hemoglobin A1C 9.1 (A) 4.0 - 5.6 %   HbA1c POC (<> result, manual entry)     HbA1c, POC (prediabetic range)     HbA1c, POC (controlled diabetic range)       Assessment/Plan: 1. Encounter for general adult medical examination with abnormal findings Up to date on PHM.    2. Essential hypertension Stable, continue present management.   3. Type 2 diabetes mellitus with hyperglycemia, unspecified whether long term insulin use (HCC) Elevated A1C from prednisone, will recheck in 3 months.  - POCT HgB A1C  4. Gastroesophageal reflux disease without esophagitis Stable, continue present management.   5. Hyperkalemia Elevated at last blood draw, 5.4  Will redraw. Order sent, pt is also on Zestril   6. Mixed hyperlipidemia Stable, well controlled at this time.   7. Dysuria - UA/M w/rflx Culture, Routine  General Counseling: Kaniel verbalizes understanding of the findings of todays visit and agrees with plan of treatment. I have discussed any further diagnostic evaluation that may be needed or ordered today. We also reviewed his medications today. he has been encouraged to call the office with any questions or concerns that should arise related to todays visit.   Orders Placed This Encounter  Procedures  . Microscopic Examination  . UA/M w/rflx Culture, Routine  . Basic Metabolic Panel (BMET)  . POCT HgB A1C     Time spent: 35 Minutes   This patient was seen by [name] AGNP-C in Collaboration with Dr Lavera Guise as a part of collaborative care agreement    [name] AGNP-C Internal Medicine

## 2018-10-31 LAB — UA/M W/RFLX CULTURE, ROUTINE
Bilirubin, UA: NEGATIVE
Ketones, UA: NEGATIVE
Leukocytes,UA: NEGATIVE
Nitrite, UA: NEGATIVE
Protein,UA: NEGATIVE
RBC, UA: NEGATIVE
Specific Gravity, UA: >=1.03 — AB (ref 1.005–1.030)
Urobilinogen, Ur: 0.2 mg/dL (ref 0.2–1.0)
pH, UA: 5 (ref 5.0–7.5)

## 2018-10-31 LAB — MICROSCOPIC EXAMINATION
Bacteria, UA: NONE SEEN
Casts: NONE SEEN /lpf
Epithelial Cells (non renal): NONE SEEN /hpf (ref 0–10)

## 2018-11-11 ENCOUNTER — Other Ambulatory Visit: Payer: Self-pay

## 2018-11-11 MED ORDER — SUCRALFATE 1 G PO TABS: 1.0000 g | ORAL_TABLET | Freq: Three times a day (TID) | ORAL | 1 refills | Status: DC

## 2018-11-28 LAB — POCT GLYCOSYLATED HEMOGLOBIN (HGB A1C): Hemoglobin A1C: 9.1 % — AB (ref 4.0–5.6)

## 2018-12-04 NOTE — Addendum Note (Signed)
Addended by: Lavera Guise on: 12/04/2018 06:25 PM   Modules accepted: Orders

## 2019-01-27 ENCOUNTER — Telehealth: Payer: Self-pay

## 2019-01-27 NOTE — Telephone Encounter (Signed)
Confirmed appointment with patients mother. Ricardo Durham

## 2019-01-29 ENCOUNTER — Ambulatory Visit: Payer: BC Managed Care – PPO | Admitting: Adult Health

## 2019-01-29 ENCOUNTER — Other Ambulatory Visit: Payer: Self-pay | Admitting: Adult Health

## 2019-01-29 DIAGNOSIS — E1165 Type 2 diabetes mellitus with hyperglycemia: Secondary | ICD-10-CM

## 2019-02-19 DIAGNOSIS — I10 Essential (primary) hypertension: Secondary | ICD-10-CM | POA: Diagnosis not present

## 2019-02-19 DIAGNOSIS — E782 Mixed hyperlipidemia: Secondary | ICD-10-CM | POA: Diagnosis not present

## 2019-02-19 DIAGNOSIS — R0602 Shortness of breath: Secondary | ICD-10-CM | POA: Diagnosis not present

## 2019-02-19 DIAGNOSIS — I25118 Atherosclerotic heart disease of native coronary artery with other forms of angina pectoris: Secondary | ICD-10-CM | POA: Diagnosis not present

## 2019-03-14 ENCOUNTER — Other Ambulatory Visit: Payer: Self-pay

## 2019-03-14 DIAGNOSIS — E1165 Type 2 diabetes mellitus with hyperglycemia: Secondary | ICD-10-CM

## 2019-03-14 MED ORDER — GLYBURIDE 5 MG PO TABS: 5.0000 mg | ORAL_TABLET | Freq: Two times a day (BID) | ORAL | 0 refills | Status: DC

## 2019-04-02 ENCOUNTER — Other Ambulatory Visit: Payer: Self-pay

## 2019-04-02 MED ORDER — JANUMET 50-1000 MG PO TABS: 1.0000 | ORAL_TABLET | Freq: Two times a day (BID) | ORAL | 1 refills | Status: DC

## 2019-04-03 ENCOUNTER — Other Ambulatory Visit: Payer: Self-pay

## 2019-04-03 DIAGNOSIS — E1165 Type 2 diabetes mellitus with hyperglycemia: Secondary | ICD-10-CM

## 2019-04-03 MED ORDER — GLYBURIDE 5 MG PO TABS: 5.0000 mg | ORAL_TABLET | Freq: Two times a day (BID) | ORAL | 0 refills | Status: DC

## 2019-04-07 ENCOUNTER — Other Ambulatory Visit: Payer: Self-pay

## 2019-04-07 MED ORDER — PANTOPRAZOLE SODIUM 40 MG PO TBEC: 40.0000 mg | DELAYED_RELEASE_TABLET | Freq: Two times a day (BID) | ORAL | 0 refills | Status: DC

## 2019-04-08 ENCOUNTER — Telehealth: Payer: Self-pay

## 2019-04-08 NOTE — Telephone Encounter (Signed)
CONFIRMED AND SCREENED FOR 04-10-19 OV. 

## 2019-04-10 ENCOUNTER — Ambulatory Visit: Payer: BC Managed Care – PPO | Admitting: Adult Health

## 2019-04-10 ENCOUNTER — Encounter: Payer: Self-pay | Admitting: Adult Health

## 2019-04-10 ENCOUNTER — Other Ambulatory Visit: Payer: Self-pay

## 2019-04-10 VITALS — BP 116/67 | HR 84 | Temp 97.4°F | Resp 16 | Ht 71.0 in | Wt 225.0 lb

## 2019-04-10 DIAGNOSIS — N521 Erectile dysfunction due to diseases classified elsewhere: Secondary | ICD-10-CM

## 2019-04-10 DIAGNOSIS — J449 Chronic obstructive pulmonary disease, unspecified: Secondary | ICD-10-CM

## 2019-04-10 DIAGNOSIS — I10 Essential (primary) hypertension: Secondary | ICD-10-CM | POA: Diagnosis not present

## 2019-04-10 DIAGNOSIS — E1165 Type 2 diabetes mellitus with hyperglycemia: Secondary | ICD-10-CM

## 2019-04-10 DIAGNOSIS — K219 Gastro-esophageal reflux disease without esophagitis: Secondary | ICD-10-CM

## 2019-04-10 LAB — POCT GLYCOSYLATED HEMOGLOBIN (HGB A1C): Hemoglobin A1C: 10.1 % — AB (ref 4.0–5.6)

## 2019-04-10 MED ORDER — JANUMET 50-1000 MG PO TABS: 1.0000 | ORAL_TABLET | Freq: Two times a day (BID) | ORAL | 2 refills | Status: DC

## 2019-04-10 MED ORDER — GLYBURIDE 5 MG PO TABS: 5.0000 mg | ORAL_TABLET | Freq: Two times a day (BID) | ORAL | 2 refills | Status: DC

## 2019-04-10 MED ORDER — SILDENAFIL CITRATE 20 MG PO TABS: 20.0000 mg | ORAL_TABLET | Freq: Every day | ORAL | 0 refills | Status: DC | PRN

## 2019-04-10 MED ORDER — CANAGLIFLOZIN 100 MG PO TABS: ORAL_TABLET | ORAL | 2 refills | Status: DC

## 2019-04-10 NOTE — Progress Notes (Signed)
[facility] [address], [place] [address]  Internal MEDICINE  Office Visit Note  Patient Name: Ricardo Durham  [id number]  [id number]  Date of Service: 04/10/2019  Chief Complaint  Patient presents with  . Follow-up  . Diabetes    HPI  Pt is here for follow up on DM. He reports he has been eating poorly.  He has run out of one of his DM medications.  His A1C today is up to 10.1.  He refuses to use an injectable medication and would like to work on his diet and medications as directed.    Current Medication: Outpatient Encounter Medications as of 04/10/2019  Medication Sig  . albuterol (PROVENTIL) (2.5 MG/3ML) 0.083% nebulizer solution Take 3 mLs (2.5 mg total) by nebulization every 6 (six) hours as needed for wheezing or shortness of breath.  . Allantoin 0.5 % LOTN Apply topically.  . ALOE VERA MOISTURIZING EX Apply topically.  Marland Kitchen aspirin 81 MG tablet Take 81 mg by mouth every other day.  Marland Kitchen atorvastatin (LIPITOR) 80 MG tablet Take 1 tablet (80 mg total) by mouth daily.  . benzonatate (TESSALON PERLES) 100 MG capsule Take 1 capsule (100 mg total) by mouth 3 (three) times daily as needed for cough.  Marland Kitchen glucose blood (ACCU-CHEK AVIVA PLUS) test strip Use once daily for sugar e11.65  . glyBURIDE (DIABETA) 5 MG tablet Take 1 tablet (5 mg total) by mouth 2 (two) times daily with a meal.  . INVOKANA 100 MG TABS tablet TAKE 1 TABLET (100 MG TOTAL) BY MOUTH DAILY BEFORE BREAKFAST.  Marland Kitchen isosorbide mononitrate (IMDUR) 30 MG 24 hr tablet Take 30 mg by mouth daily.  Marland Kitchen lisinopril (PRINIVIL,ZESTRIL) 5 MG tablet Take 5 mg by mouth daily.  . pantoprazole (PROTONIX) 40 MG tablet Take 1 tablet (40 mg total) by mouth 2 (two) times daily.  . phenazopyridine (PYRIDIUM) 200 MG tablet Take 1 tablet (200 mg total) by mouth 3 (three) times daily as needed for pain.  . predniSONE (DELTASONE) 10 MG tablet Use per dose pack  . sildenafil (REVATIO) 20 MG tablet TAKE 1 TABLET BY MOUTH ONCE  DAILY AS NEEDED  . sildenafil (VIAGRA) 50 MG tablet Take 1 to 2 tablets po QD prn  . sitaGLIPtin-metformin (JANUMET) 50-1000 MG tablet Take 1 tablet by mouth 2 (two) times daily.  . sucralfate (CARAFATE) 1 g tablet Take 1 tablet (1 g total) by mouth 4 (four) times daily -  with meals and at bedtime.   No facility-administered encounter medications on file as of 04/10/2019.    Surgical History: Past Surgical History:  Procedure Laterality Date  . CARDIAC CATHETERIZATION Left 08/24/2015   Procedure: Left Heart Cath and Coronary Angiography;  Surgeon: Corey Skains, MD;  Location: [place] [facility];  Service: Cardiovascular;  Laterality: Left;  . COLONOSCOPY WITH PROPOFOL N/A 10/26/2014   Procedure: COLONOSCOPY WITH PROPOFOL;  Surgeon: Lollie Sails, MD;  Location: [facility] ENDOSCOPY;  Service: Endoscopy;  Laterality: N/A;  . ESOPHAGOGASTRODUODENOSCOPY (EGD) WITH PROPOFOL N/A 10/26/2014   Procedure: ESOPHAGOGASTRODUODENOSCOPY (EGD) WITH PROPOFOL;  Surgeon: Lollie Sails, MD;  Location: [facility] ENDOSCOPY;  Service: Endoscopy;  Laterality: N/A;  . NECK SURGERY     c 6-7 due to stable fracture  . NISSEN FUNDOPLICATION    . UMBILICAL HERNIA REPAIR  2001    Medical History: Past Medical History:  Diagnosis Date  . COPD (chronic obstructive pulmonary disease) ([place])   . Diabetes mellitus without complication ([place])  Family History: Family History  Problem Relation Age of Onset  . Diabetes Father     Social History   Socioeconomic History  . Marital status: Divorced    Spouse name: Not on file  . Number of children: Not on file  . Years of education: Not on file  . Highest education level: Not on file  Occupational History  . Not on file  Tobacco Use  . Smoking status: Former Smoker    Types: Cigarettes  . Smokeless tobacco: Never Used  Substance and Sexual Activity  . Alcohol use: Yes    Alcohol/week: 1.0 standard drinks    Types: 1 Cans of beer per week    Comment:  occasionally  . Drug use: No  . Sexual activity: Not on file  Other Topics Concern  . Not on file  Social History Narrative  . Not on file   Social Determinants of Health   Financial Resource Strain:   . Difficulty of Paying Living Expenses:   Food Insecurity:   . Worried About Charity fundraiser in the Last Year:   . Arboriculturist in the Last Year:   Transportation Needs:   . Film/video editor (Medical):   Marland Kitchen Lack of Transportation (Non-Medical):   Physical Activity:   . Days of Exercise per Week:   . Minutes of Exercise per Session:   Stress:   . Feeling of Stress :   Social Connections:   . Frequency of Communication with Friends and Family:   . Frequency of Social Gatherings with Friends and Family:   . Attends Religious Services:   . Active Member of Clubs or Organizations:   . Attends Archivist Meetings:   Marland Kitchen Marital Status:   Intimate Partner Violence:   . Fear of Current or Ex-Partner:   . Emotionally Abused:   Marland Kitchen Physically Abused:   . Sexually Abused:       Review of Systems  Constitutional: Negative.  Negative for chills, fatigue and unexpected weight change.  HENT: Negative.  Negative for congestion, rhinorrhea, sneezing and sore throat.   Eyes: Negative for redness.  Respiratory: Negative.  Negative for cough, chest tightness and shortness of breath.   Cardiovascular: Negative.  Negative for chest pain and palpitations.  Gastrointestinal: Negative.  Negative for abdominal pain, constipation, diarrhea, nausea and vomiting.  Endocrine: Negative.   Genitourinary: Negative.  Negative for dysuria and frequency.  Musculoskeletal: Negative.  Negative for arthralgias, back pain, joint swelling and neck pain.  Skin: Negative.  Negative for rash.  Allergic/Immunologic: Negative.   Neurological: Negative.  Negative for tremors and numbness.  Hematological: Negative for adenopathy. Does not bruise/bleed easily.  Psychiatric/Behavioral: Negative.   Negative for behavioral problems, sleep disturbance and suicidal ideas. The patient is not nervous/anxious.     Vital Signs: BP 116/67   Pulse 84   Temp (!) 97.4 F (36.3 C)   Resp 16   Ht 5\' 11"  (1.803 m)   Wt 225 lb (102.1 kg)   SpO2 96%   BMI 31.38 kg/m    Physical Exam Vitals and nursing note reviewed.  Constitutional:      General: He is not in acute distress.    Appearance: He is well-developed. He is not diaphoretic.  HENT:     Head: Normocephalic and atraumatic.     Mouth/Throat:     Pharynx: No oropharyngeal exudate.  Eyes:     Pupils: Pupils are equal, round, and reactive to light.  Neck:     Thyroid: No thyromegaly.     Vascular: No JVD.     Trachea: No tracheal deviation.  Cardiovascular:     Rate and Rhythm: Normal rate and regular rhythm.     Heart sounds: Normal heart sounds. No murmur. No friction rub. No gallop.   Pulmonary:     Effort: Pulmonary effort is normal. No respiratory distress.     Breath sounds: Normal breath sounds. No wheezing or rales.  Chest:     Chest wall: No tenderness.  Abdominal:     Palpations: Abdomen is soft.     Tenderness: There is no abdominal tenderness. There is no guarding.  Musculoskeletal:        General: Normal range of motion.     Cervical back: Normal range of motion and neck supple.  Lymphadenopathy:     Cervical: No cervical adenopathy.  Skin:    General: Skin is warm and dry.  Neurological:     Mental Status: He is alert and oriented to person, place, and time.     Cranial Nerves: No cranial nerve deficit.  Psychiatric:        Behavior: Behavior normal.        Thought Content: Thought content normal.        Judgment: Judgment normal.    Assessment/Plan: 1. Uncontrolled type 2 diabetes mellitus with hyperglycemia (HCC) Refilled medications.  Discussed that if next A1C is not significantly improved, patient will go on injectable medication.  He verbalized understanding. Discussed dietary changes.   -  POCT glycosylated hemoglobin (Hb A1C) - glyBURIDE (DIABETA) 5 MG tablet; Take 1 tablet (5 mg total) by mouth 2 (two) times daily with a meal.  Dispense: 60 tablet; Refill: 2 - canagliflozin (INVOKANA) 100 MG TABS tablet; TAKE 1 TABLET (100 MG TOTAL) BY MOUTH DAILY BEFORE BREAKFAST.  Dispense: 30 tablet; Refill: 2  2. Chronic obstructive pulmonary disease, unspecified COPD type ([place]) Stable, continue to monitor.   3. Erectile dysfunction due to diseases classified elsewhere Continue to use Revatio as directed.  - sildenafil (REVATIO) 20 MG tablet; Take 1 tablet (20 mg total) by mouth daily as needed. Take 1-4 tablets by mouth daily as needed.  Dispense: 60 tablet; Refill: 0  4. Essential hypertension Stable, continue present management.   5. Gastroesophageal reflux disease without esophagitis Continue with Protonix as directed.   General Counseling: miley wamboldt understanding of the findings of todays visit and agrees with plan of treatment. I have discussed any further diagnostic evaluation that may be needed or ordered today. We also reviewed his medications today. he has been encouraged to call the office with any questions or concerns that should arise related to todays visit.    Orders Placed This Encounter  Procedures  . POCT glycosylated hemoglobin (Hb A1C)    No orders of the defined types were placed in this encounter.   Time spent: 25 Minutes   This patient was seen by [name] AGNP-C in Collaboration with Dr Lavera Guise as a part of collaborative care agreement     [name] AGNP-C Internal medicine

## 2019-04-15 DIAGNOSIS — R0602 Shortness of breath: Secondary | ICD-10-CM | POA: Diagnosis not present

## 2019-04-15 DIAGNOSIS — I6523 Occlusion and stenosis of bilateral carotid arteries: Secondary | ICD-10-CM | POA: Diagnosis not present

## 2019-04-15 DIAGNOSIS — I25118 Atherosclerotic heart disease of native coronary artery with other forms of angina pectoris: Secondary | ICD-10-CM | POA: Diagnosis not present

## 2019-05-01 DIAGNOSIS — I2510 Atherosclerotic heart disease of native coronary artery without angina pectoris: Secondary | ICD-10-CM | POA: Diagnosis not present

## 2019-05-01 DIAGNOSIS — E782 Mixed hyperlipidemia: Secondary | ICD-10-CM | POA: Diagnosis not present

## 2019-05-01 DIAGNOSIS — I6523 Occlusion and stenosis of bilateral carotid arteries: Secondary | ICD-10-CM | POA: Diagnosis not present

## 2019-05-01 DIAGNOSIS — I10 Essential (primary) hypertension: Secondary | ICD-10-CM | POA: Diagnosis not present

## 2019-05-02 ENCOUNTER — Other Ambulatory Visit: Payer: Self-pay

## 2019-05-02 DIAGNOSIS — E1165 Type 2 diabetes mellitus with hyperglycemia: Secondary | ICD-10-CM

## 2019-05-02 MED ORDER — GLYBURIDE 5 MG PO TABS: ORAL_TABLET | ORAL | 3 refills | Status: DC

## 2019-05-16 ENCOUNTER — Telehealth: Payer: Self-pay

## 2019-05-16 NOTE — Telephone Encounter (Signed)
Faxed medical record payment request to [employer] [facility] at the attention of Vern Claude fax # [phone].

## 2019-05-16 NOTE — Telephone Encounter (Signed)
Medical record request completed and requested records mailed to [facility] [address] [place], [place] [address]

## 2019-06-02 ENCOUNTER — Other Ambulatory Visit: Payer: Self-pay

## 2019-06-02 MED ORDER — ACCU-CHEK AVIVA PLUS VI STRP: ORAL_STRIP | 3 refills | Status: DC

## 2019-06-12 ENCOUNTER — Other Ambulatory Visit: Payer: Self-pay

## 2019-06-12 MED ORDER — SUCRALFATE 1 G PO TABS: 1.0000 g | ORAL_TABLET | Freq: Three times a day (TID) | ORAL | 1 refills | Status: DC

## 2019-07-01 ENCOUNTER — Other Ambulatory Visit: Payer: Self-pay | Admitting: Adult Health

## 2019-07-03 ENCOUNTER — Other Ambulatory Visit: Payer: Self-pay | Admitting: Adult Health

## 2019-07-08 ENCOUNTER — Telehealth: Payer: Self-pay

## 2019-07-08 NOTE — Telephone Encounter (Signed)
Tried informing patient of appointment on 07/10/2019 no voicemail. klh

## 2019-07-08 NOTE — Telephone Encounter (Signed)
Confirmed appointment on 07/10/2019 and screened for covid. klh  

## 2019-07-10 ENCOUNTER — Ambulatory Visit: Payer: BC Managed Care – PPO | Admitting: Adult Health

## 2019-07-10 ENCOUNTER — Encounter: Payer: Self-pay | Admitting: Adult Health

## 2019-07-10 ENCOUNTER — Other Ambulatory Visit: Payer: Self-pay

## 2019-07-10 VITALS — BP 123/81 | HR 82 | Temp 97.6°F | Resp 16 | Ht 71.0 in | Wt 223.8 lb

## 2019-07-10 DIAGNOSIS — E1165 Type 2 diabetes mellitus with hyperglycemia: Secondary | ICD-10-CM | POA: Diagnosis not present

## 2019-07-10 DIAGNOSIS — I10 Essential (primary) hypertension: Secondary | ICD-10-CM

## 2019-07-10 DIAGNOSIS — J449 Chronic obstructive pulmonary disease, unspecified: Secondary | ICD-10-CM | POA: Diagnosis not present

## 2019-07-10 DIAGNOSIS — N521 Erectile dysfunction due to diseases classified elsewhere: Secondary | ICD-10-CM

## 2019-07-10 LAB — POCT GLYCOSYLATED HEMOGLOBIN (HGB A1C): Hemoglobin A1C: 8.2 % — AB (ref 4.0–5.6)

## 2019-07-10 MED ORDER — ACCU-CHEK GUIDE VI STRP: ORAL_STRIP | 12 refills | Status: AC

## 2019-07-10 MED ORDER — SILDENAFIL CITRATE 20 MG PO TABS: 20.0000 mg | ORAL_TABLET | Freq: Every day | ORAL | 0 refills | Status: DC | PRN

## 2019-07-10 NOTE — Progress Notes (Signed)
[facility] [place], [place] [address]  Internal MEDICINE  Office Visit Note  Patient Name: Ricardo Durham  [id number]  [id number]  Date of Service: 07/10/2019  Chief Complaint  Patient presents with  . Follow-up    sugar higher in the morning  . Diabetes    HPI  Pt is here for follow up on DM, COPD, and HTN.  He reports he has made some major dietary changes.  He has stopped drinking iced coffee. He has stopped eating bread.  He is working on eliminating other carbohydrates. His A1C is much improved to 8.2 from 10.1. His blood pressure is well controled.  His copd is well controlled at this time also.        Current Medication: Outpatient Encounter Medications as of 07/10/2019  Medication Sig  . albuterol (PROVENTIL) (2.5 MG/3ML) 0.083% nebulizer solution Take 3 mLs (2.5 mg total) by nebulization every 6 (six) hours as needed for wheezing or shortness of breath.  . Allantoin 0.5 % LOTN Apply topically.  . ALOE VERA MOISTURIZING EX Apply topically.  Marland Kitchen aspirin 81 MG tablet Take 81 mg by mouth every other day.  Marland Kitchen atorvastatin (LIPITOR) 80 MG tablet Take 1 tablet (80 mg total) by mouth daily.  . benzonatate (TESSALON PERLES) 100 MG capsule Take 1 capsule (100 mg total) by mouth 3 (three) times daily as needed for cough.  . canagliflozin (INVOKANA) 100 MG TABS tablet TAKE 1 TABLET (100 MG TOTAL) BY MOUTH DAILY BEFORE BREAKFAST.  Marland Kitchen glucose blood (ACCU-CHEK AVIVA PLUS) test strip Use once daily for sugar e11.65  . glyBURIDE (DIABETA) 5 MG tablet Take 2 tablets by mouth 2 times daily.  . isosorbide mononitrate (IMDUR) 30 MG 24 hr tablet Take 30 mg by mouth daily.  Marland Kitchen JANUMET 50-1000 MG tablet TAKE 1 TABLET BY MOUTH 2 (TWO) TIMES DAILY.  Marland Kitchen lisinopril (PRINIVIL,ZESTRIL) 5 MG tablet Take 5 mg by mouth daily.  . pantoprazole (PROTONIX) 40 MG tablet TAKE 1 TABLET BY MOUTH TWICE A DAY  . phenazopyridine (PYRIDIUM) 200 MG tablet Take 1 tablet (200 mg total) by mouth 3  (three) times daily as needed for pain.  . predniSONE (DELTASONE) 10 MG tablet Use per dose pack  . sildenafil (REVATIO) 20 MG tablet Take 1 tablet (20 mg total) by mouth daily as needed. Take 1-4 tablets by mouth daily as needed.  . sucralfate (CARAFATE) 1 g tablet Take 1 tablet (1 g total) by mouth 4 (four) times daily -  with meals and at bedtime.   No facility-administered encounter medications on file as of 07/10/2019.    Surgical History: Past Surgical History:  Procedure Laterality Date  . CARDIAC CATHETERIZATION Left 08/24/2015   Procedure: Left Heart Cath and Coronary Angiography;  Surgeon: Corey Skains, MD;  Location: [place] [facility];  Service: Cardiovascular;  Laterality: Left;  . COLONOSCOPY WITH PROPOFOL N/A 10/26/2014   Procedure: COLONOSCOPY WITH PROPOFOL;  Surgeon: Lollie Sails, MD;  Location: [facility] ENDOSCOPY;  Service: Endoscopy;  Laterality: N/A;  . ESOPHAGOGASTRODUODENOSCOPY (EGD) WITH PROPOFOL N/A 10/26/2014   Procedure: ESOPHAGOGASTRODUODENOSCOPY (EGD) WITH PROPOFOL;  Surgeon: Lollie Sails, MD;  Location: [facility] ENDOSCOPY;  Service: Endoscopy;  Laterality: N/A;  . NECK SURGERY     c 6-7 due to stable fracture  . NISSEN FUNDOPLICATION    . UMBILICAL HERNIA REPAIR  2001    Medical History: Past Medical History:  Diagnosis Date  . COPD (chronic obstructive pulmonary disease) ([place])   .  Diabetes mellitus without complication ([place])     Family History: Family History  Problem Relation Age of Onset  . Diabetes Father     Social History   Socioeconomic History  . Marital status: Divorced    Spouse name: Not on file  . Number of children: Not on file  . Years of education: Not on file  . Highest education level: Not on file  Occupational History  . Not on file  Tobacco Use  . Smoking status: Former Smoker    Types: Cigarettes  . Smokeless tobacco: Never Used  Vaping Use  . Vaping Use: Never used  Substance and Sexual Activity  . Alcohol use:  Yes    Alcohol/week: 1.0 standard drink    Types: 1 Cans of beer per week    Comment: occasionally  . Drug use: No  . Sexual activity: Not on file  Other Topics Concern  . Not on file  Social History Narrative  . Not on file   Social Determinants of Health   Financial Resource Strain:   . Difficulty of Paying Living Expenses:   Food Insecurity:   . Worried About Charity fundraiser in the Last Year:   . Arboriculturist in the Last Year:   Transportation Needs:   . Film/video editor (Medical):   Marland Kitchen Lack of Transportation (Non-Medical):   Physical Activity:   . Days of Exercise per Week:   . Minutes of Exercise per Session:   Stress:   . Feeling of Stress :   Social Connections:   . Frequency of Communication with Friends and Family:   . Frequency of Social Gatherings with Friends and Family:   . Attends Religious Services:   . Active Member of Clubs or Organizations:   . Attends Archivist Meetings:   Marland Kitchen Marital Status:   Intimate Partner Violence:   . Fear of Current or Ex-Partner:   . Emotionally Abused:   Marland Kitchen Physically Abused:   . Sexually Abused:       Review of Systems  Constitutional: Negative.  Negative for chills, fatigue and unexpected weight change.  HENT: Negative.  Negative for congestion, rhinorrhea, sneezing and sore throat.   Eyes: Negative for redness.  Respiratory: Negative.  Negative for cough, chest tightness and shortness of breath.   Cardiovascular: Negative.  Negative for chest pain and palpitations.  Gastrointestinal: Negative.  Negative for abdominal pain, constipation, diarrhea, nausea and vomiting.  Endocrine: Negative.   Genitourinary: Negative.  Negative for dysuria and frequency.  Musculoskeletal: Negative.  Negative for arthralgias, back pain, joint swelling and neck pain.  Skin: Negative.  Negative for rash.  Allergic/Immunologic: Negative.   Neurological: Negative.  Negative for tremors and numbness.  Hematological:  Negative for adenopathy. Does not bruise/bleed easily.  Psychiatric/Behavioral: Negative.  Negative for behavioral problems, sleep disturbance and suicidal ideas. The patient is not nervous/anxious.     Vital Signs: BP 123/81   Pulse 82   Temp 97.6 F (36.4 C)   Resp 16   Ht 5\' 11"  (1.803 m)   Wt 223 lb 12.8 oz (101.5 kg)   SpO2 92%   BMI 31.21 kg/m    Physical Exam Vitals and nursing note reviewed.  Constitutional:      General: He is not in acute distress.    Appearance: He is well-developed. He is not diaphoretic.  HENT:     Head: Normocephalic and atraumatic.     Mouth/Throat:     Pharynx:  No oropharyngeal exudate.  Eyes:     Pupils: Pupils are equal, round, and reactive to light.  Neck:     Thyroid: No thyromegaly.     Vascular: No JVD.     Trachea: No tracheal deviation.  Cardiovascular:     Rate and Rhythm: Normal rate and regular rhythm.     Heart sounds: Normal heart sounds. No murmur heard.  No friction rub. No gallop.   Pulmonary:     Effort: Pulmonary effort is normal. No respiratory distress.     Breath sounds: Normal breath sounds. No wheezing or rales.  Chest:     Chest wall: No tenderness.  Abdominal:     Palpations: Abdomen is soft.     Tenderness: There is no abdominal tenderness. There is no guarding.  Musculoskeletal:        General: Normal range of motion.     Cervical back: Normal range of motion and neck supple.  Lymphadenopathy:     Cervical: No cervical adenopathy.  Skin:    General: Skin is warm and dry.  Neurological:     Mental Status: He is alert and oriented to person, place, and time.     Cranial Nerves: No cranial nerve deficit.  Psychiatric:        Behavior: Behavior normal.        Thought Content: Thought content normal.        Judgment: Judgment normal.    Assessment/Plan: 1. Uncontrolled type 2 diabetes mellitus with hyperglycemia (HCC) A1C improving to 8.2 from 10.1 at last check. contineu with current medications and  follow up as discussed.  - POCT HgB A1C  2. Chronic obstructive pulmonary disease, unspecified COPD type ([place]) Controlled, continue current management.   3. Essential hypertension Controlled, continue to monitor.  4. Erectile dysfunction due to diseases classified elsewhere Discussed safety of revatio and signs and symptoms to present to ED for.  - sildenafil (REVATIO) 20 MG tablet; Take 1 tablet (20 mg total) by mouth daily as needed. Take 1-4 tablets by mouth daily as needed.  Dispense: 60 tablet; Refill: 0  General Counseling: Zelma verbalizes understanding of the findings of todays visit and agrees with plan of treatment. I have discussed any further diagnostic evaluation that may be needed or ordered today. We also reviewed his medications today. he has been encouraged to call the office with any questions or concerns that should arise related to todays visit.    Orders Placed This Encounter  Procedures  . POCT HgB A1C    No orders of the defined types were placed in this encounter.   Time spent: 30 Minutes   This patient was seen by [name] AGNP-C in Collaboration with Dr Lavera Guise as a part of collaborative care agreement     [name] AGNP-C Internal medicine

## 2019-07-11 ENCOUNTER — Other Ambulatory Visit: Payer: Self-pay

## 2019-07-11 DIAGNOSIS — N521 Erectile dysfunction due to diseases classified elsewhere: Secondary | ICD-10-CM

## 2019-07-11 MED ORDER — SILDENAFIL CITRATE 20 MG PO TABS: ORAL_TABLET | ORAL | 0 refills | Status: DC

## 2019-07-22 ENCOUNTER — Other Ambulatory Visit: Payer: Self-pay | Admitting: Adult Health

## 2019-07-25 ENCOUNTER — Other Ambulatory Visit: Payer: Self-pay | Admitting: Adult Health

## 2019-07-25 DIAGNOSIS — E1165 Type 2 diabetes mellitus with hyperglycemia: Secondary | ICD-10-CM

## 2019-08-06 ENCOUNTER — Other Ambulatory Visit: Payer: Self-pay | Admitting: Adult Health

## 2019-08-06 DIAGNOSIS — N521 Erectile dysfunction due to diseases classified elsewhere: Secondary | ICD-10-CM

## 2019-08-26 ENCOUNTER — Other Ambulatory Visit: Payer: Self-pay | Admitting: Adult Health

## 2019-08-26 DIAGNOSIS — E1165 Type 2 diabetes mellitus with hyperglycemia: Secondary | ICD-10-CM

## 2019-09-23 DIAGNOSIS — Z08 Encounter for follow-up examination after completed treatment for malignant neoplasm: Secondary | ICD-10-CM | POA: Diagnosis not present

## 2019-09-23 DIAGNOSIS — L570 Actinic keratosis: Secondary | ICD-10-CM | POA: Diagnosis not present

## 2019-09-23 DIAGNOSIS — L720 Epidermal cyst: Secondary | ICD-10-CM | POA: Diagnosis not present

## 2019-09-23 DIAGNOSIS — L728 Other follicular cysts of the skin and subcutaneous tissue: Secondary | ICD-10-CM | POA: Diagnosis not present

## 2019-09-23 DIAGNOSIS — X32XXXA Exposure to sunlight, initial encounter: Secondary | ICD-10-CM | POA: Diagnosis not present

## 2019-09-23 DIAGNOSIS — D485 Neoplasm of uncertain behavior of skin: Secondary | ICD-10-CM | POA: Diagnosis not present

## 2019-09-23 DIAGNOSIS — C44519 Basal cell carcinoma of skin of other part of trunk: Secondary | ICD-10-CM | POA: Diagnosis not present

## 2019-09-23 DIAGNOSIS — R208 Other disturbances of skin sensation: Secondary | ICD-10-CM | POA: Diagnosis not present

## 2019-09-23 DIAGNOSIS — Z85828 Personal history of other malignant neoplasm of skin: Secondary | ICD-10-CM | POA: Diagnosis not present

## 2019-09-23 DIAGNOSIS — C44319 Basal cell carcinoma of skin of other parts of face: Secondary | ICD-10-CM | POA: Diagnosis not present

## 2019-10-07 ENCOUNTER — Other Ambulatory Visit: Payer: Self-pay | Admitting: Adult Health

## 2019-10-07 ENCOUNTER — Telehealth: Payer: Self-pay

## 2019-10-07 NOTE — Telephone Encounter (Signed)
Please see why sucralfate

## 2019-10-07 NOTE — Telephone Encounter (Signed)
confirmed and screened for OV on 9/16

## 2019-10-09 ENCOUNTER — Other Ambulatory Visit: Payer: Self-pay

## 2019-10-09 ENCOUNTER — Ambulatory Visit: Payer: BC Managed Care – PPO | Admitting: Hospice and Palliative Medicine

## 2019-10-09 ENCOUNTER — Encounter: Payer: Self-pay | Admitting: Hospice and Palliative Medicine

## 2019-10-09 DIAGNOSIS — I10 Essential (primary) hypertension: Secondary | ICD-10-CM | POA: Diagnosis not present

## 2019-10-09 DIAGNOSIS — Z135 Encounter for screening for eye and ear disorders: Secondary | ICD-10-CM | POA: Diagnosis not present

## 2019-10-09 DIAGNOSIS — E1165 Type 2 diabetes mellitus with hyperglycemia: Secondary | ICD-10-CM

## 2019-10-09 DIAGNOSIS — Z4802 Encounter for removal of sutures: Secondary | ICD-10-CM | POA: Diagnosis not present

## 2019-10-09 LAB — POCT GLYCOSYLATED HEMOGLOBIN (HGB A1C): Hemoglobin A1C: 8.3 % — AB (ref 4.0–5.6)

## 2019-10-09 NOTE — Progress Notes (Signed)
[facility] [place], [place] [address]  Internal MEDICINE  Office Visit Note  Patient Name: Ricardo Durham  [id number]  [id number]  Date of Service: 10/11/2019  Chief Complaint  Patient presents with  . Follow-up    wants stitches removed  . Diabetes  . Quality Metric Gaps    foot exam, eye exam, TDAP, pneumovax    HPI Patient is here for routine follow-up 1. DM-he has not been checking his blood sugars as he knows he should be, he is also concerned that his A1C might be high because he has been drinking more recently, drinking about 1 beer of 1 mixed drink a night, he has not had an eye exam this year 2. He is requesting that his stitches be removed today, he had a mole removed from his back by his dermatologist over a month ago and he missed his follow-up appointment to has his stitches removed   Current Medication: Outpatient Encounter Medications as of 10/09/2019  Medication Sig  . albuterol (PROVENTIL) (2.5 MG/3ML) 0.083% nebulizer solution Take 3 mLs (2.5 mg total) by nebulization every 6 (six) hours as needed for wheezing or shortness of breath.  . Allantoin 0.5 % LOTN Apply topically.  . ALOE VERA MOISTURIZING EX Apply topically.  Marland Kitchen aspirin 81 MG tablet Take 81 mg by mouth every other Ricardo.  Marland Kitchen atorvastatin (LIPITOR) 80 MG tablet Take 1 tablet (80 mg total) by mouth daily.  . canagliflozin (INVOKANA) 100 MG TABS tablet TAKE 1 TABLET (100 MG TOTAL) BY MOUTH DAILY BEFORE BREAKFAST.  Marland Kitchen glucose blood (ACCU-CHEK GUIDE) test strip Use as instructed  . glyBURIDE (DIABETA) 5 MG tablet TAKE 2 TABLETS BY MOUTH TWICE A Ricardo  . isosorbide mononitrate (IMDUR) 30 MG 24 hr tablet Take 30 mg by mouth daily.  Marland Kitchen JANUMET 50-1000 MG tablet TAKE 1 TABLET BY MOUTH 2 (TWO) TIMES DAILY.  Marland Kitchen lisinopril (PRINIVIL,ZESTRIL) 5 MG tablet Take 5 mg by mouth daily.  . pantoprazole (PROTONIX) 40 MG tablet TAKE 1 TABLET BY MOUTH TWICE A Ricardo  . phenazopyridine (PYRIDIUM) 200 MG  tablet Take 1 tablet (200 mg total) by mouth 3 (three) times daily as needed for pain.  . predniSONE (DELTASONE) 10 MG tablet Use per dose pack  . sildenafil (REVATIO) 20 MG tablet TAKE 1-4 TABLETS BY MOUTH DAILY AS NEEDED.  Marland Kitchen sucralfate (CARAFATE) 1 g tablet TAKE 1 TABLET (1 G TOTAL) BY MOUTH 4 (FOUR) TIMES DAILY - WITH MEALS AND AT BEDTIME.   No facility-administered encounter medications on file as of 10/09/2019.    Surgical History: Past Surgical History:  Procedure Laterality Date  . CARDIAC CATHETERIZATION Left 08/24/2015   Procedure: Left Heart Cath and Coronary Angiography;  Surgeon: Corey Skains, MD;  Location: [place] [facility];  Service: Cardiovascular;  Laterality: Left;  . COLONOSCOPY WITH PROPOFOL N/A 10/26/2014   Procedure: COLONOSCOPY WITH PROPOFOL;  Surgeon: Lollie Sails, MD;  Location: [facility] ENDOSCOPY;  Service: Endoscopy;  Laterality: N/A;  . CYST REMOVAL TRUNK N/A   . ESOPHAGOGASTRODUODENOSCOPY (EGD) WITH PROPOFOL N/A 10/26/2014   Procedure: ESOPHAGOGASTRODUODENOSCOPY (EGD) WITH PROPOFOL;  Surgeon: Lollie Sails, MD;  Location: [facility] ENDOSCOPY;  Service: Endoscopy;  Laterality: N/A;  . NECK SURGERY     c 6-7 due to stable fracture  . NISSEN FUNDOPLICATION    . UMBILICAL HERNIA REPAIR  2001    Medical History: Past Medical History:  Diagnosis Date  . COPD (chronic obstructive pulmonary disease) ([place])   .  Diabetes mellitus without complication ([place])     Family History: Family History  Problem Relation Age of Onset  . Diabetes Father     Social History   Socioeconomic History  . Marital status: Divorced    Spouse name: Not on file  . Number of children: Not on file  . Years of education: Not on file  . Highest education level: Not on file  Occupational History  . Not on file  Tobacco Use  . Smoking status: Former Smoker    Types: Cigarettes  . Smokeless tobacco: Never Used  Vaping Use  . Vaping Use: Never used  Substance and Sexual  Activity  . Alcohol use: Yes    Alcohol/week: 1.0 standard drink    Types: 1 Cans of beer per week    Comment: occasionally  . Drug use: No  . Sexual activity: Not on file  Other Topics Concern  . Not on file  Social History Narrative  . Not on file   Social Determinants of Health   Financial Resource Strain:   . Difficulty of Paying Living Expenses: Not on file  Food Insecurity:   . Worried About Charity fundraiser in the Last Year: Not on file  . Ran Out of Food in the Last Year: Not on file  Transportation Needs:   . Lack of Transportation (Medical): Not on file  . Lack of Transportation (Non-Medical): Not on file  Physical Activity:   . Days of Exercise per Week: Not on file  . Minutes of Exercise per Session: Not on file  Stress:   . Feeling of Stress : Not on file  Social Connections:   . Frequency of Communication with Friends and Family: Not on file  . Frequency of Social Gatherings with Friends and Family: Not on file  . Attends Religious Services: Not on file  . Active Member of Clubs or Organizations: Not on file  . Attends Archivist Meetings: Not on file  . Marital Status: Not on file  Intimate Partner Violence:   . Fear of Current or Ex-Partner: Not on file  . Emotionally Abused: Not on file  . Physically Abused: Not on file  . Sexually Abused: Not on file   Review of Systems  Constitutional: Negative for chills, fatigue and unexpected weight change.  HENT: Negative for congestion, postnasal drip, rhinorrhea, sneezing and sore throat.   Eyes: Negative for photophobia, redness and visual disturbance.  Respiratory: Negative for cough, chest tightness and shortness of breath.   Cardiovascular: Negative for chest pain, palpitations and leg swelling.  Gastrointestinal: Negative for abdominal pain, constipation, diarrhea, nausea and vomiting.  Genitourinary: Negative for dysuria and frequency.  Musculoskeletal: Negative for arthralgias, back pain,  joint swelling and neck pain.  Skin: Negative for rash.       Stitches to his upper back, were to be removed about a week ago  Neurological: Negative.  Negative for tremors and numbness.  Hematological: Negative for adenopathy. Does not bruise/bleed easily.  Psychiatric/Behavioral: Negative for behavioral problems (Depression), sleep disturbance and suicidal ideas. The patient is not nervous/anxious.     Vital Signs: BP 124/71   Pulse 94   Temp (!) 97.1 F (36.2 C)   Resp 16   Ht 5\' 11"  (1.803 m)   Wt 220 lb 6.4 oz (100 kg)   SpO2 97%   BMI 30.74 kg/m    Physical Exam Vitals reviewed.  Constitutional:      Appearance: Normal appearance.  HENT:  Nose: Nose normal.     Mouth/Throat:     Mouth: Mucous membranes are moist.     Pharynx: Oropharynx is clear.  Cardiovascular:     Rate and Rhythm: Normal rate and regular rhythm.     Pulses: Normal pulses.     Heart sounds: Normal heart sounds.  Pulmonary:     Effort: Pulmonary effort is normal.     Breath sounds: Normal breath sounds.  Abdominal:     General: Abdomen is flat.     Palpations: Abdomen is soft.  Musculoskeletal:        General: Normal range of motion.     Cervical back: Normal range of motion.  Skin:    General: Skin is warm.     Comments: 3 sutures to left upper back, area clean and healed  Neurological:     General: No focal deficit present.     Mental Status: He is alert and oriented to person, place, and time. Mental status is at baseline.  Psychiatric:        Mood and Affect: Mood normal.        Behavior: Behavior normal.        Thought Content: Thought content normal.    Assessment/Plan: 1. Uncontrolled type 2 diabetes mellitus with hyperglycemia (HCC) A1C 8.3 today, slightly up from last check. He has been drinking more since last check and has not been focusing as much on his diet and exercise. Advised to monitor his blood sugars at home and to keep a log with the times he checked them and  bring them to his next follow-up appointment. Will review blood sugar log and adjust plan of care according to his readings. At this time advised to continue with current therapy but to focus more on his eating habits and exercise and avoiding alcohol.will increase Invokana to 300 mg on next visit, might need change of therapy  - POCT HgB A1C  2. Essential hypertension BP and HR well controlled today, continue with current therapy and routine monitoring.  3. Screening for diabetic retinopathy Referral sent, emphasized importance of annual screening for diabetic retinopathy. - Ambulatory referral to Ophthalmology  4. Encounter for removal of sutures Sutures placed about 1 month prior by dermatologist for mole removal. Cleaned area with non-rinse wound cleanser, allowed to dry, 3 sutures removed. Bandage not required as area was healed properly, no evidence of infection.  General Counseling: Ricardo Durham understanding of the findings of todays visit and agrees with plan of treatment. I have discussed any further diagnostic evaluation that may be needed or ordered today. We also reviewed his medications today. he has been encouraged to call the office with any questions or concerns that should arise related to todays visit.    Orders Placed This Encounter  Procedures  . Ambulatory referral to Ophthalmology  . POCT HgB A1C    Time spent: 30 Minutes   This patient was seen by Theodoro Grist AGNP-C in Collaboration with Dr Lavera Guise as a part of collaborative care agreement     Tanna Furry. Lennox Dolberry AGNP-C Internal medicine

## 2019-10-11 ENCOUNTER — Encounter: Payer: Self-pay | Admitting: Hospice and Palliative Medicine

## 2019-11-03 ENCOUNTER — Encounter: Payer: Self-pay | Admitting: Adult Health

## 2019-11-03 ENCOUNTER — Ambulatory Visit (INDEPENDENT_AMBULATORY_CARE_PROVIDER_SITE_OTHER): Payer: BC Managed Care – PPO | Admitting: Adult Health

## 2019-11-03 ENCOUNTER — Other Ambulatory Visit: Payer: Self-pay

## 2019-11-03 VITALS — BP 122/80 | HR 79 | Temp 98.1°F | Resp 16 | Ht 71.0 in | Wt 223.6 lb

## 2019-11-03 DIAGNOSIS — R300 Dysuria: Secondary | ICD-10-CM

## 2019-11-03 DIAGNOSIS — Z0001 Encounter for general adult medical examination with abnormal findings: Secondary | ICD-10-CM | POA: Diagnosis not present

## 2019-11-03 DIAGNOSIS — J449 Chronic obstructive pulmonary disease, unspecified: Secondary | ICD-10-CM | POA: Diagnosis not present

## 2019-11-03 DIAGNOSIS — I10 Essential (primary) hypertension: Secondary | ICD-10-CM | POA: Diagnosis not present

## 2019-11-03 DIAGNOSIS — K219 Gastro-esophageal reflux disease without esophagitis: Secondary | ICD-10-CM | POA: Diagnosis not present

## 2019-11-03 DIAGNOSIS — Z79899 Other long term (current) drug therapy: Secondary | ICD-10-CM

## 2019-11-03 DIAGNOSIS — N521 Erectile dysfunction due to diseases classified elsewhere: Secondary | ICD-10-CM

## 2019-11-03 NOTE — Progress Notes (Signed)
[facility] [address], [place] [address]  Internal MEDICINE  Office Visit Note  Patient Name: Ricardo Durham  [id number]  [id number]  Date of Service: 11/03/2019  Chief Complaint  Patient presents with  . Annual Exam  . Quality Metric Gaps    pneumonia vacc (declined), Tdap vacc (said to had 8 yrs ago), eye exam (scheduled )  . controlled substance policy    acknowledged     HPI Pt is here for routine health maintenance examination.  He is a well appearing 64 yo male. He has a history of HTn, DM, copd, gerd and ED.  He denies any new or worsening symptoms. His blood sugars have been around 150-240 mg/dl. He is taking his medications, and his a1c last month is 8.3.    Current Medication: Outpatient Encounter Medications as of 11/03/2019  Medication Sig  . albuterol (PROVENTIL) (2.5 MG/3ML) 0.083% nebulizer solution Take 3 mLs (2.5 mg total) by nebulization every 6 (six) hours as needed for wheezing or shortness of breath.  . Allantoin 0.5 % LOTN Apply topically.  . ALOE VERA MOISTURIZING EX Apply topically.  Marland Kitchen aspirin 81 MG tablet Take 81 mg by mouth every other day.  Marland Kitchen atorvastatin (LIPITOR) 80 MG tablet Take 1 tablet (80 mg total) by mouth daily.  . canagliflozin (INVOKANA) 100 MG TABS tablet TAKE 1 TABLET (100 MG TOTAL) BY MOUTH DAILY BEFORE BREAKFAST.  Marland Kitchen glucose blood (ACCU-CHEK GUIDE) test strip Use as instructed  . glyBURIDE (DIABETA) 5 MG tablet TAKE 2 TABLETS BY MOUTH TWICE A DAY  . isosorbide mononitrate (IMDUR) 30 MG 24 hr tablet Take 30 mg by mouth daily.  Marland Kitchen JANUMET 50-1000 MG tablet TAKE 1 TABLET BY MOUTH 2 (TWO) TIMES DAILY.  Marland Kitchen lisinopril (PRINIVIL,ZESTRIL) 5 MG tablet Take 5 mg by mouth daily.  . pantoprazole (PROTONIX) 40 MG tablet TAKE 1 TABLET BY MOUTH TWICE A DAY  . sildenafil (REVATIO) 20 MG tablet TAKE 1-4 TABLETS BY MOUTH DAILY AS NEEDED.  Marland Kitchen sucralfate (CARAFATE) 1 g tablet TAKE 1 TABLET (1 G TOTAL) BY MOUTH 4 (FOUR) TIMES DAILY -  WITH MEALS AND AT BEDTIME.  . [DISCONTINUED] phenazopyridine (PYRIDIUM) 200 MG tablet Take 1 tablet (200 mg total) by mouth 3 (three) times daily as needed for pain. (Patient not taking: Reported on 11/03/2019)  . [DISCONTINUED] predniSONE (DELTASONE) 10 MG tablet Use per dose pack (Patient not taking: Reported on 11/03/2019)   No facility-administered encounter medications on file as of 11/03/2019.    Surgical History: Past Surgical History:  Procedure Laterality Date  . CARDIAC CATHETERIZATION Left 08/24/2015   Procedure: Left Heart Cath and Coronary Angiography;  Surgeon: Corey Skains, MD;  Location: [facility];  Service: Cardiovascular;  Laterality: Left;  . COLONOSCOPY WITH PROPOFOL N/A 10/26/2014   Procedure: COLONOSCOPY WITH PROPOFOL;  Surgeon: Lollie Sails, MD;  Location: [facility] ENDOSCOPY;  Service: Endoscopy;  Laterality: N/A;  . CYST REMOVAL TRUNK N/A   . ESOPHAGOGASTRODUODENOSCOPY (EGD) WITH PROPOFOL N/A 10/26/2014   Procedure: ESOPHAGOGASTRODUODENOSCOPY (EGD) WITH PROPOFOL;  Surgeon: Lollie Sails, MD;  Location: [facility] ENDOSCOPY;  Service: Endoscopy;  Laterality: N/A;  . NECK SURGERY     c 6-7 due to stable fracture  . NISSEN FUNDOPLICATION    . UMBILICAL HERNIA REPAIR  2001    Medical History: Past Medical History:  Diagnosis Date  . COPD (chronic obstructive pulmonary disease) ([place])   . Diabetes mellitus without complication [facility])     Family  History: Family History  Problem Relation Age of Onset  . Diabetes Father   . Alzheimer's disease Mother       Review of Systems  Constitutional: Negative.  Negative for chills, fatigue and unexpected weight change.  HENT: Negative.  Negative for congestion, rhinorrhea, sneezing and sore throat.   Eyes: Negative for redness.  Respiratory: Negative.  Negative for cough, chest tightness and shortness of breath.   Cardiovascular: Negative.  Negative for chest pain and palpitations.  Gastrointestinal:  Negative.  Negative for abdominal pain, constipation, diarrhea, nausea and vomiting.  Endocrine: Negative.   Genitourinary: Negative.  Negative for dysuria and frequency.  Musculoskeletal: Negative.  Negative for arthralgias, back pain, joint swelling and neck pain.  Skin: Negative.  Negative for rash.  Allergic/Immunologic: Negative.   Neurological: Negative.  Negative for tremors and numbness.  Hematological: Negative for adenopathy. Does not bruise/bleed easily.  Psychiatric/Behavioral: Negative.  Negative for behavioral problems, sleep disturbance and suicidal ideas. The patient is not nervous/anxious.      Vital Signs: BP 122/80   Pulse 79   Temp 98.1 F (36.7 C)   Resp 16   Ht 5\' 11"  (1.803 m)   Wt 223 lb 9.6 oz (101.4 kg)   SpO2 97%   BMI 31.19 kg/m    Physical Exam Vitals and nursing note reviewed.  Constitutional:      General: He is not in acute distress.    Appearance: He is well-developed. He is not diaphoretic.  HENT:     Head: Normocephalic and atraumatic.     Mouth/Throat:     Pharynx: No oropharyngeal exudate.  Eyes:     Pupils: Pupils are equal, round, and reactive to light.  Neck:     Thyroid: No thyromegaly.     Vascular: No JVD.     Trachea: No tracheal deviation.  Cardiovascular:     Rate and Rhythm: Normal rate and regular rhythm.     Heart sounds: Normal heart sounds. No murmur heard.  No friction rub. No gallop.   Pulmonary:     Effort: Pulmonary effort is normal. No respiratory distress.     Breath sounds: Normal breath sounds. No wheezing or rales.  Chest:     Chest wall: No tenderness.  Abdominal:     Palpations: Abdomen is soft.     Tenderness: There is no abdominal tenderness. There is no guarding.  Musculoskeletal:        General: Normal range of motion.     Cervical back: Normal range of motion and neck supple.  Lymphadenopathy:     Cervical: No cervical adenopathy.  Skin:    General: Skin is warm and dry.  Neurological:      Mental Status: He is alert and oriented to person, place, and time.     Cranial Nerves: No cranial nerve deficit.  Psychiatric:        Behavior: Behavior normal.        Thought Content: Thought content normal.        Judgment: Judgment normal.      LABS: Recent Results (from the past 2160 hour(s))  POCT HgB A1C     Status: Abnormal   Collection Time: 10/09/19  4:44 PM  Result Value Ref Range   Hemoglobin A1C 8.3 (A) 4.0 - 5.6 %   HbA1c POC (<> result, manual entry)     HbA1c, POC (prediabetic range)     HbA1c, POC (controlled diabetic range)       Assessment/Plan: 1.  Encounter for general adult medical examination with abnormal findings Up to date on PHM.    2. Essential hypertension Stable, continue current management.   3. Chronic obstructive pulmonary disease, unspecified COPD type ([place]) Stable, continue current medications and management.  4. Gastroesophageal reflux disease without esophagitis Stable, continue medications as directed.   5. Erectile dysfunction due to diseases classified elsewhere Good results with sildenafil, continue as prescribed.   6. Dysuria - UA/M w/rflx Culture, Routine  General Counseling: Dontrae verbalizes understanding of the findings of todays visit and agrees with plan of treatment. I have discussed any further diagnostic evaluation that may be needed or ordered today. We also reviewed his medications today. he has been encouraged to call the office with any questions or concerns that should arise related to todays visit.   Orders Placed This Encounter  Procedures  . UA/M w/rflx Culture, Routine    No orders of the defined types were placed in this encounter.   Time spent: 30 Minutes   This patient was seen by [name] AGNP-C in Collaboration with Dr Lavera Guise as a part of collaborative care agreement    [name] AGNP-C Internal Medicine

## 2019-11-04 LAB — MICROSCOPIC EXAMINATION
Bacteria, UA: NONE SEEN
Casts: NONE SEEN /lpf
Epithelial Cells (non renal): NONE SEEN /hpf (ref 0–10)
RBC, Urine: NONE SEEN /hpf (ref 0–2)
WBC, UA: NONE SEEN /hpf (ref 0–5)

## 2019-11-04 LAB — UA/M W/RFLX CULTURE, ROUTINE
Bilirubin, UA: NEGATIVE
Ketones, UA: NEGATIVE
Leukocytes,UA: NEGATIVE
Nitrite, UA: NEGATIVE
Protein,UA: NEGATIVE
RBC, UA: NEGATIVE
Specific Gravity, UA: >=1.03 — AB (ref 1.005–1.030)
Urobilinogen, Ur: 0.2 mg/dL (ref 0.2–1.0)
pH, UA: 5 (ref 5.0–7.5)

## 2019-11-17 ENCOUNTER — Other Ambulatory Visit: Payer: Self-pay | Admitting: Adult Health

## 2019-11-18 DIAGNOSIS — C44519 Basal cell carcinoma of skin of other part of trunk: Secondary | ICD-10-CM | POA: Diagnosis not present

## 2019-12-02 DIAGNOSIS — C44319 Basal cell carcinoma of skin of other parts of face: Secondary | ICD-10-CM | POA: Diagnosis not present

## 2019-12-02 DIAGNOSIS — Z0001 Encounter for general adult medical examination with abnormal findings: Secondary | ICD-10-CM | POA: Diagnosis not present

## 2019-12-02 DIAGNOSIS — Z79899 Other long term (current) drug therapy: Secondary | ICD-10-CM | POA: Diagnosis not present

## 2019-12-03 LAB — CBC WITH DIFFERENTIAL/PLATELET
Basophils Absolute: 0.1 10*3/uL (ref 0.0–0.2)
Basos: 1 %
EOS (ABSOLUTE): 0.1 10*3/uL (ref 0.0–0.4)
Eos: 1 %
Hematocrit: 44.2 % (ref 37.5–51.0)
Hemoglobin: 14.9 g/dL (ref 13.0–17.7)
Immature Grans (Abs): 0.1 10*3/uL (ref 0.0–0.1)
Immature Granulocytes: 1 %
Lymphocytes Absolute: 2 10*3/uL (ref 0.7–3.1)
Lymphs: 22 %
MCH: 28.3 pg (ref 26.6–33.0)
MCHC: 33.7 g/dL (ref 31.5–35.7)
MCV: 84 fL (ref 79–97)
Monocytes Absolute: 0.7 10*3/uL (ref 0.1–0.9)
Monocytes: 8 %
Neutrophils Absolute: 6.2 10*3/uL (ref 1.4–7.0)
Neutrophils: 67 %
Platelets: 285 10*3/uL (ref 150–450)
RBC: 5.26 x10E6/uL (ref 4.14–5.80)
RDW: 12.7 % (ref 11.6–15.4)
WBC: 9.2 10*3/uL (ref 3.4–10.8)

## 2019-12-03 LAB — LIPID PANEL WITH LDL/HDL RATIO
Cholesterol, Total: 163 mg/dL (ref 100–199)
HDL: 46 mg/dL (ref >39)
LDL Chol Calc (NIH): 100 mg/dL — ABNORMAL HIGH (ref 0–99)
LDL/HDL Ratio: 2.2 ratio (ref 0.0–3.6)
Triglycerides: 91 mg/dL (ref 0–149)
VLDL Cholesterol Cal: 17 mg/dL (ref 5–40)

## 2019-12-03 LAB — COMPREHENSIVE METABOLIC PANEL
ALT: 24 IU/L (ref 0–44)
AST: 12 IU/L (ref 0–40)
Albumin/Globulin Ratio: 2.1 (ref 1.2–2.2)
Albumin: 4.5 g/dL (ref 3.8–4.8)
Alkaline Phosphatase: 68 IU/L (ref 44–121)
BUN/Creatinine Ratio: 17 (ref 10–24)
BUN: 15 mg/dL (ref 8–27)
Bilirubin Total: 0.3 mg/dL (ref 0.0–1.2)
CO2: 23 mmol/L (ref 20–29)
Calcium: 9.3 mg/dL (ref 8.6–10.2)
Chloride: 102 mmol/L (ref 96–106)
Creatinine, Ser: 0.9 mg/dL (ref 0.76–1.27)
GFR calc Af Amer: 105 mL/min/{1.73_m2} (ref >59)
GFR calc non Af Amer: 91 mL/min/{1.73_m2} (ref >59)
Globulin, Total: 2.1 g/dL (ref 1.5–4.5)
Glucose: 183 mg/dL — ABNORMAL HIGH (ref 65–99)
Potassium: 4.5 mmol/L (ref 3.5–5.2)
Sodium: 138 mmol/L (ref 134–144)
Total Protein: 6.6 g/dL (ref 6.0–8.5)

## 2019-12-03 LAB — TSH: TSH: 1.74 u[IU]/mL (ref 0.450–4.500)

## 2019-12-03 LAB — PSA: Prostate Specific Ag, Serum: 1.5 ng/mL (ref 0.0–4.0)

## 2019-12-03 LAB — T4, FREE: Free T4: 1.23 ng/dL (ref 0.82–1.77)

## 2019-12-25 ENCOUNTER — Other Ambulatory Visit: Payer: Self-pay | Admitting: Adult Health

## 2020-01-01 ENCOUNTER — Other Ambulatory Visit: Payer: Self-pay | Admitting: Internal Medicine

## 2020-01-01 NOTE — Telephone Encounter (Signed)
Refilled Suralfate this time, however investigate for long term use

## 2020-01-08 ENCOUNTER — Ambulatory Visit: Payer: BC Managed Care – PPO | Admitting: Hospice and Palliative Medicine

## 2020-01-15 ENCOUNTER — Ambulatory Visit: Payer: BC Managed Care – PPO | Admitting: Hospice and Palliative Medicine

## 2020-01-15 ENCOUNTER — Telehealth: Payer: Self-pay

## 2020-01-15 NOTE — Telephone Encounter (Signed)
No vm was unable to lmom to reschedule 01-15-20 ov.

## 2020-01-19 ENCOUNTER — Encounter: Payer: Self-pay | Admitting: Nurse Practitioner

## 2020-01-19 ENCOUNTER — Ambulatory Visit: Payer: BC Managed Care – PPO | Admitting: Nurse Practitioner

## 2020-01-19 VITALS — Temp 97.9°F

## 2020-01-19 DIAGNOSIS — Z20828 Contact with and (suspected) exposure to other viral communicable diseases: Secondary | ICD-10-CM | POA: Insufficient documentation

## 2020-01-19 DIAGNOSIS — J069 Acute upper respiratory infection, unspecified: Secondary | ICD-10-CM | POA: Insufficient documentation

## 2020-01-19 MED ORDER — OSELTAMIVIR PHOSPHATE 75 MG PO CAPS: 75.0000 mg | ORAL_CAPSULE | Freq: Two times a day (BID) | ORAL | 0 refills | Status: DC

## 2020-01-19 MED ORDER — AZITHROMYCIN 250 MG PO TABS: ORAL_TABLET | ORAL | 0 refills | Status: DC

## 2020-01-19 NOTE — Progress Notes (Signed)
[facility] [address] [place], [place] [address]  Internal MEDICINE  Telephone Visit  Patient Name: Ricardo Durham  [id number]  [id number]  Date of Service: 01/19/2020  I connected with the patient at 1:04pm by telephone and verified the patients identity using two identifiers.   I discussed the limitations, risks, security and privacy concerns of performing an evaluation and management service by telephone and the availability of in person appointments. I also discussed with the patient that there may be a patient responsible charge related to the service.  The patient expressed understanding and agrees to proceed.    Chief Complaint  Patient presents with  . Sinus Problem    Having sinus infection symptoms, feels like it is moving to lungs, coughing up mucus; using breathing treatments to help relieve symptoms; feels like elephant is sitting on chest.    . Telephone Assessment  . Telephone Screen    The patient has been contacted via telephone for follow up visit due to concerns for spread of novel coronavirus. The patient states that he started having sinus headache with post nasal drip. He also had headache. States that this is now moving down into his chest. He denies fever, chills, or body aches. States that he has been around his young niece who had similar symptoms. She tested negative for COVID 19 but was positive for flu.       Current Medication: Outpatient Encounter Medications as of 01/19/2020  Medication Sig  . albuterol (PROVENTIL) (2.5 MG/3ML) 0.083% nebulizer solution Take 3 mLs (2.5 mg total) by nebulization every 6 (six) hours as needed for wheezing or shortness of breath.  . Allantoin 0.5 % LOTN Apply topically.  . ALOE VERA MOISTURIZING EX Apply topically.  Marland Kitchen aspirin 81 MG tablet Take 81 mg by mouth every other day.  Marland Kitchen atorvastatin (LIPITOR) 80 MG tablet Take 1 tablet (80 mg total) by mouth daily.  Marland Kitchen azithromycin (ZITHROMAX) 250 MG tablet z-pack  - take as directed for 5 days  . canagliflozin (INVOKANA) 100 MG TABS tablet TAKE 1 TABLET (100 MG TOTAL) BY MOUTH DAILY BEFORE BREAKFAST.  Marland Kitchen glucose blood (ACCU-CHEK GUIDE) test strip Use as instructed  . glyBURIDE (DIABETA) 5 MG tablet TAKE 2 TABLETS BY MOUTH TWICE A DAY  . isosorbide mononitrate (IMDUR) 30 MG 24 hr tablet Take 30 mg by mouth daily.  Marland Kitchen JANUMET 50-1000 MG tablet TAKE 1 TABLET BY MOUTH 2 (TWO) TIMES DAILY.  Marland Kitchen lisinopril (PRINIVIL,ZESTRIL) 5 MG tablet Take 5 mg by mouth daily.  Marland Kitchen oseltamivir (TAMIFLU) 75 MG capsule Take 1 capsule (75 mg total) by mouth 2 (two) times daily.  . pantoprazole (PROTONIX) 40 MG tablet TAKE 1 TABLET BY MOUTH TWICE A DAY  . sildenafil (REVATIO) 20 MG tablet TAKE 1-4 TABLETS BY MOUTH DAILY AS NEEDED.  Marland Kitchen sucralfate (CARAFATE) 1 g tablet TAKE 1 TABLET (1 G TOTAL) BY MOUTH 4 (FOUR) TIMES DAILY - WITH MEALS AND AT BEDTIME.   No facility-administered encounter medications on file as of 01/19/2020.    Surgical History: Past Surgical History:  Procedure Laterality Date  . CARDIAC CATHETERIZATION Left 08/24/2015   Procedure: Left Heart Cath and Coronary Angiography;  Surgeon: Lamar Blinks, MD;  Location: [facility] INVASIVE CV LAB;  Service: Cardiovascular;  Laterality: Left;  . COLONOSCOPY WITH PROPOFOL N/A 10/26/2014   Procedure: COLONOSCOPY WITH PROPOFOL;  Surgeon: Christena Deem, MD;  Location: [facility] ENDOSCOPY;  Service: Endoscopy;  Laterality: N/A;  . CYST REMOVAL TRUNK N/A   .  ESOPHAGOGASTRODUODENOSCOPY (EGD) WITH PROPOFOL N/A 10/26/2014   Procedure: ESOPHAGOGASTRODUODENOSCOPY (EGD) WITH PROPOFOL;  Surgeon: Lollie Sails, MD;  Location: [facility] ENDOSCOPY;  Service: Endoscopy;  Laterality: N/A;  . NECK SURGERY     c 6-7 due to stable fracture  . NISSEN FUNDOPLICATION    . UMBILICAL HERNIA REPAIR  2001    Medical History: Past Medical History:  Diagnosis Date  . COPD (chronic obstructive pulmonary disease) ([place])   . Diabetes mellitus without  complication ([place])     Family History: Family History  Problem Relation Age of Onset  . Diabetes Father   . Alzheimer's disease Mother     Social History   Socioeconomic History  . Marital status: Divorced    Spouse name: Not on file  . Number of children: Not on file  . Years of education: Not on file  . Highest education level: Not on file  Occupational History  . Not on file  Tobacco Use  . Smoking status: Former Smoker    Types: Cigarettes  . Smokeless tobacco: Never Used  Vaping Use  . Vaping Use: Never used  Substance and Sexual Activity  . Alcohol use: Yes    Alcohol/week: 1.0 standard drink    Types: 1 Cans of beer per week    Comment: occasionally  . Drug use: No  . Sexual activity: Not on file  Other Topics Concern  . Not on file  Social History Narrative  . Not on file   Social Determinants of Health   Financial Resource Strain: Not on file  Food Insecurity: Not on file  Transportation Needs: Not on file  Physical Activity: Not on file  Stress: Not on file  Social Connections: Not on file  Intimate Partner Violence: Not on file      Review of Systems  Constitutional: Positive for fatigue. Negative for chills, fever and unexpected weight change.  HENT: Positive for congestion, postnasal drip, rhinorrhea, sinus pressure and sinus pain. Negative for sneezing and sore throat.   Respiratory: Positive for cough and wheezing. Negative for chest tightness and shortness of breath.   Cardiovascular: Negative for chest pain and palpitations.  Gastrointestinal: Negative for abdominal pain, constipation, diarrhea, nausea and vomiting.  Musculoskeletal: Negative for arthralgias, back pain, joint swelling and neck pain.  Skin: Negative for rash.  Allergic/Immunologic: Positive for environmental allergies.  Neurological: Positive for headaches. Negative for tremors and numbness.  Hematological: Positive for adenopathy. Does not bruise/bleed easily.   Psychiatric/Behavioral: Negative for behavioral problems (Depression), sleep disturbance and suicidal ideas. The patient is not nervous/anxious.     Vital Signs: Temp 97.9 F (36.6 C)    Observation/Objective:  The patient is alert and oriented. He is pleasant and answering all questions appropriately. Breathing is non-labored. He is in no acute distress.  The patient has moderate nasal congestion and dry, non-productive cough that can he heard through phone call.    Assessment/Plan: 1. Upper respiratory tract infection, unspecified type Start z-pack. Take as directed for 5 days. Add tamiflu 75mg  twice daily for 5 days due to recent exposure to flu. Advised him to rest and increase fluids. Take OTC medications as needed and as indicated to improve acute symptoms.  - azithromycin (ZITHROMAX) 250 MG tablet; z-pack - take as directed for 5 days  Dispense: 6 tablet; Refill: 0 - oseltamivir (TAMIFLU) 75 MG capsule; Take 1 capsule (75 mg total) by mouth 2 (two) times daily.  Dispense: 10 capsule; Refill: 0  2. Exposure to the flu  Add tamiflu 75mg  twice daily for 5 days due to recent exposure to flu. Advised him to rest and increase fluids. Take OTC medications as needed and as indicated to improve acute symptoms.  - oseltamivir (TAMIFLU) 75 MG capsule; Take 1 capsule (75 mg total) by mouth 2 (two) times daily.  Dispense: 10 capsule; Refill: 0  General Counseling: Vern verbalizes understanding of the findings of today's phone visit and agrees with plan of treatment. I have discussed any further diagnostic evaluation that may be needed or ordered today. We also reviewed his medications today. he has been encouraged to call the office with any questions or concerns that should arise related to todays visit.   Rest and increase fluids. Continue using OTC medication to control symptoms.   This patient was seen by [place] with Dr Lavera Guise as a part of collaborative  care agreement  Meds ordered this encounter  Medications  . azithromycin (ZITHROMAX) 250 MG tablet    Sig: z-pack - take as directed for 5 days    Dispense:  6 tablet    Refill:  0    Order Specific Question:   Supervising Provider    Answer:   Lavera Guise [place]  . oseltamivir (TAMIFLU) 75 MG capsule    Sig: Take 1 capsule (75 mg total) by mouth 2 (two) times daily.    Dispense:  10 capsule    Refill:  0    Order Specific Question:   Supervising Provider    Answer:   Lavera Guise [id number]    Time spent: 51 Minutes    Dr Lavera Guise Internal medicine

## 2020-01-22 ENCOUNTER — Ambulatory Visit (INDEPENDENT_AMBULATORY_CARE_PROVIDER_SITE_OTHER): Payer: BC Managed Care – PPO

## 2020-01-22 ENCOUNTER — Encounter: Payer: Self-pay | Admitting: Emergency Medicine

## 2020-01-22 ENCOUNTER — Ambulatory Visit
Admission: EM | Admit: 2020-01-22 | Discharge: 2020-01-22 | Disposition: A | Discharge status: Home / Self Care | Payer: BC Managed Care – PPO | Attending: Physician Assistant | Admitting: Physician Assistant

## 2020-01-22 ENCOUNTER — Other Ambulatory Visit: Payer: Self-pay

## 2020-01-22 DIAGNOSIS — J439 Emphysema, unspecified: Secondary | ICD-10-CM | POA: Diagnosis not present

## 2020-01-22 DIAGNOSIS — R059 Cough, unspecified: Secondary | ICD-10-CM

## 2020-01-22 DIAGNOSIS — J441 Chronic obstructive pulmonary disease with (acute) exacerbation: Secondary | ICD-10-CM

## 2020-01-22 DIAGNOSIS — U071 COVID-19: Secondary | ICD-10-CM | POA: Diagnosis not present

## 2020-01-22 DIAGNOSIS — R0602 Shortness of breath: Secondary | ICD-10-CM

## 2020-01-22 HISTORY — DX: Essential (primary) hypertension: I10

## 2020-01-22 HISTORY — DX: Hyperlipidemia, unspecified: E78.5

## 2020-01-22 LAB — RESP PANEL BY RT-PCR (FLU A&B, COVID) ARPGX2
Influenza A by PCR: NEGATIVE
Influenza B by PCR: NEGATIVE
SARS Coronavirus 2 by RT PCR: POSITIVE — AB

## 2020-01-22 MED ORDER — PREDNISONE 20 MG PO TABS: 40.0000 mg | ORAL_TABLET | Freq: Every day | ORAL | 0 refills | Status: AC

## 2020-01-22 NOTE — ED Triage Notes (Addendum)
Patient in today c/o cough and nasal congestion x 5-7 days. Patient has been exposed flu and covid. Patient's PCP prescribed a Zpak and patient still has 2 days of that and Tamiflu and has been taking OTC Theraflu. Patient has not had the covid vaccines. Patient had covid ~1 year ago.

## 2020-01-22 NOTE — ED Provider Notes (Signed)
[facility]    CSN: [id number] Arrival date & time: 01/22/20  1052      History   Chief Complaint Chief Complaint  Patient presents with   Cough   Nasal Congestion    HPI Ricardo Durham is a 64 y.o. male presenting for approximately 1 week history of nasal congestion and productive cough. Patient states he has been exposed to both influenza and COVID-19. He did have a telemedicine appointment with his PCP 3 days ago and was prescribed azithromycin and Tamiflu. He is also been taking over-the-counter TheraFlu. Patient denies having COVID-19 vaccines. He states that he has personal history of Covid 1 year ago.  He denies any fevers.  Admits to some fatigue and difficulty sleeping.  Patient states that he does feel little bit more short of breath than normal.  Denies any abdominal pain, nausea/vomiting/diarrhea or chest pain. PMH significant for COPD, diabetes, hyperlipidemia, and hypertension.  No other complaints or concerns.  HPI  Past Medical History:  Diagnosis Date   COPD (chronic obstructive pulmonary disease) ([place])    Diabetes mellitus without complication ([place])    Hyperlipemia    Hypertension     Patient Active Problem List   Diagnosis Date Noted   Upper respiratory tract infection 01/19/2020   Exposure to the flu 01/19/2020   Testicular hypofunction 07/04/2018   Generalized osteoarthritis 04/14/2018   Bladder spasm 04/14/2018   Uncontrolled type 2 diabetes mellitus with hyperglycemia ([place]) 08/31/2017   Cyst of skin and subcutaneous tissue 08/31/2017   Essential hypertension 08/31/2017   Mixed hyperlipidemia 08/31/2017   Diastasis recti 12/07/2015   Groin pain 12/07/2015   CAD (coronary artery disease) 09/28/2015   Benign essential HTN 09/28/2015   Angina decubitus ([place]) 08/10/2015   Abnormal finding on echocardiogram 08/09/2015   Bilateral inguinal hernia without obstruction or gangrene 12/03/2014   Colon polyp 09/17/2014    Diabetes mellitus type 2, controlled ([place]) 09/17/2014   Gastroesophageal reflux disease without esophagitis 09/17/2014    Past Surgical History:  Procedure Laterality Date   CARDIAC CATHETERIZATION Left 08/24/2015   Procedure: Left Heart Cath and Coronary Angiography;  Surgeon: Corey Skains, MD;  Location: [facility];  Service: Cardiovascular;  Laterality: Left;   COLONOSCOPY WITH PROPOFOL N/A 10/26/2014   Procedure: COLONOSCOPY WITH PROPOFOL;  Surgeon: Lollie Sails, MD;  Location: [facility] ENDOSCOPY;  Service: Endoscopy;  Laterality: N/A;   CYST REMOVAL TRUNK N/A    ESOPHAGOGASTRODUODENOSCOPY (EGD) WITH PROPOFOL N/A 10/26/2014   Procedure: ESOPHAGOGASTRODUODENOSCOPY (EGD) WITH PROPOFOL;  Surgeon: Lollie Sails, MD;  Location: [facility] ENDOSCOPY;  Service: Endoscopy;  Laterality: N/A;   NECK SURGERY     c 6-7 due to stable fracture   NISSEN FUNDOPLICATION     UMBILICAL HERNIA REPAIR  2001       Home Medications    Prior to Admission medications   Medication Sig Start Date End Date Taking? Authorizing Provider  albuterol (PROVENTIL) (2.5 MG/3ML) 0.083% nebulizer solution Take 3 mLs (2.5 mg total) by nebulization every 6 (six) hours as needed for wheezing or shortness of breath. 10/03/18  Yes Scarboro, Audie Clear, NP  aspirin 81 MG tablet Take 81 mg by mouth every other day.   Yes [provider]  atorvastatin (LIPITOR) 80 MG tablet Take 1 tablet (80 mg total) by mouth daily. 08/24/15  Yes Corey Skains, MD  azithromycin (ZITHROMAX) 250 MG tablet z-pack - take as directed for 5 days 01/19/20  Yes Ronnell Freshwater, NP  canagliflozin (INVOKANA) 100 MG TABS tablet TAKE 1 TABLET (100 MG TOTAL) BY MOUTH DAILY BEFORE BREAKFAST. 04/10/19  Yes Scarboro, Audie Clear, NP  glucose blood (ACCU-CHEK GUIDE) test strip Use as instructed 07/10/19  Yes Scarboro, Audie Clear, NP  glyBURIDE (DIABETA) 5 MG tablet TAKE 2 TABLETS BY MOUTH TWICE A DAY 08/26/19  Yes Scarboro, Audie Clear, NP   isosorbide mononitrate (IMDUR) 30 MG 24 hr tablet Take 30 mg by mouth daily.   Yes [provider]  JANUMET 50-1000 MG tablet TAKE 1 TABLET BY MOUTH 2 (TWO) TIMES DAILY. 07/03/19  Yes Scarboro, Audie Clear, NP  lisinopril (PRINIVIL,ZESTRIL) 5 MG tablet Take 5 mg by mouth daily.   Yes [provider]  oseltamivir (TAMIFLU) 75 MG capsule Take 1 capsule (75 mg total) by mouth 2 (two) times daily. 01/19/20  Yes Boscia, Heather E, NP  pantoprazole (PROTONIX) 40 MG tablet TAKE 1 TABLET BY MOUTH TWICE A DAY 07/01/19  Yes Scarboro, Audie Clear, NP  predniSONE (DELTASONE) 20 MG tablet Take 2 tablets (40 mg total) by mouth daily for 5 days. 01/22/20 01/27/20 Yes Laurene Footman B, PA-C  sildenafil (REVATIO) 20 MG tablet TAKE 1-4 TABLETS BY MOUTH DAILY AS NEEDED. 08/06/19  Yes Scarboro, Audie Clear, NP  sucralfate (CARAFATE) 1 g tablet TAKE 1 TABLET (1 G TOTAL) BY MOUTH 4 (FOUR) TIMES DAILY - WITH MEALS AND AT BEDTIME. 01/01/20  Yes Lavera Guise, MD  Allantoin 0.5 % LOTN Apply topically.    [provider]  ALOE VERA MOISTURIZING EX Apply topically.    [provider]    Family History Family History  Problem Relation Age of Onset   Diabetes Father    Alzheimer's disease Mother    Hypertension Mother    Hyperlipidemia Mother     Social History Social History   Tobacco Use   Smoking status: Former Smoker    Types: Cigarettes    Quit date: 01/22/1980    Years since quitting: 40.0   Smokeless tobacco: Never Used  Scientific laboratory technician Use: Never used  Substance Use Topics   Alcohol use: Not Currently    Alcohol/week: 1.0 standard drink    Types: 1 Cans of beer per week    Comment: occasionally   Drug use: No     Allergies   Oxycodone   Review of Systems Review of Systems  Constitutional: Positive for fatigue. Negative for fever.  HENT: Positive for congestion and rhinorrhea. Negative for sinus pressure, sinus pain and sore throat.   Respiratory: Positive for  cough and shortness of breath.   Gastrointestinal: Negative for abdominal pain, diarrhea, nausea and vomiting.  Musculoskeletal: Negative for myalgias.  Neurological: Negative for weakness, light-headedness and headaches.  Hematological: Negative for adenopathy.     Physical Exam Triage Vital Signs ED Triage Vitals  Enc Vitals Group     BP 01/22/20 1316 134/72     Pulse Rate 01/22/20 1316 82     Resp 01/22/20 1316 18     Temp 01/22/20 1316 98.2 F (36.8 C)     Temp Source 01/22/20 1316 Oral     SpO2 01/22/20 1316 97 %     Weight 01/22/20 1316 220 lb (99.8 kg)     Height 01/22/20 1316 5\' 11"  (1.803 m)     Head Circumference --      Peak Flow --      Pain Score 01/22/20 1315 5     Pain Loc --  Pain Edu? --      Excl. in GC? --    No data found.  Updated Vital Signs BP 134/72 (BP Location: Left Arm)    Pulse 82    Temp 98.2 F (36.8 C) (Oral)    Resp 18    Ht 5\' 11"  (1.803 m)    Wt 220 lb (99.8 kg)    SpO2 97%    BMI 30.68 kg/m        Physical Exam Vitals and nursing note reviewed.  Constitutional:      General: He is not in acute distress.    Appearance: Normal appearance. He is well-developed and well-nourished. He is not ill-appearing or diaphoretic.  HENT:     Head: Normocephalic and atraumatic.     Nose: Nose normal.     Mouth/Throat:     Mouth: Oropharynx is clear and moist and mucous membranes are normal. Mucous membranes are moist.     Pharynx: Oropharynx is clear. Uvula midline.     Tonsils: No tonsillar abscesses.  Eyes:     General: No scleral icterus.       Right eye: No discharge.        Left eye: No discharge.     Extraocular Movements: EOM normal.     Conjunctiva/sclera: Conjunctivae normal.  Neck:     Thyroid: No thyromegaly.     Trachea: No tracheal deviation.  Cardiovascular:     Rate and Rhythm: Normal rate and regular rhythm.     Heart sounds: Normal heart sounds.  Pulmonary:     Effort: Pulmonary effort is normal. No respiratory  distress.     Breath sounds: Normal breath sounds. No wheezing or rales.  Musculoskeletal:     Cervical back: Normal range of motion and neck supple.  Lymphadenopathy:     Cervical: No cervical adenopathy.  Skin:    General: Skin is warm and dry.     Findings: No rash.  Neurological:     Mental Status: He is alert. Mental status is at baseline.     Motor: No weakness.     Gait: Gait normal.  Psychiatric:        Mood and Affect: Mood normal.        Behavior: Behavior normal.        Thought Content: Thought content normal.      UC Treatments / Results  Labs (all labs ordered are listed, but only abnormal results are displayed) Labs Reviewed  RESP PANEL BY RT-PCR (FLU A&B, COVID) ARPGX2 - Abnormal; Notable for the following components:      Result Value   SARS Coronavirus 2 by RT PCR POSITIVE (*)    All other components within normal limits    EKG   Radiology DG Chest 2 View  Result Date: 01/22/2020 CLINICAL DATA:  64 year old male with shortness of breath and cough EXAM: CHEST - 2 VIEW COMPARISON:  10/07/2018 FINDINGS: Cardiomediastinal silhouette unchanged in size and contour. No pneumothorax or pleural effusion. Pleuroparenchymal thickening at the apices again demonstrated. Coarsened interstitial markings. Improved aeration of the left lung base. Stigmata of emphysema, with increased retrosternal airspace, flattened hemidiaphragms, increased AP diameter, and hyperinflation on the AP view. No confluent airspace disease. No displaced fracture. IMPRESSION: Chronic lung changes including changes of emphysema without evidence of acute cardiopulmonary disease Electronically Signed   By: 10/09/2018 D.O.   On: 01/22/2020 14:21    Procedures Procedures (including critical care time)  Medications Ordered in UC Medications -  No data to display  Initial Impression / Assessment and Plan / UC Course  I have reviewed the triage vital signs and the nursing notes.  Pertinent labs  & imaging results that were available during my care of the patient were reviewed by me and considered in my medical decision making (see chart for details).   64 year old well-appearing male with COPD history presenting for 1 week history of congestion and cough.  Patient has been taking azithromycin and Tamiflu which he says he thinks has helped.  All vital signs are normal and stable clinic.  Chest is clear to auscultation.  Chest x-ray is normal.   Respiratory panel obtained and patient is positive for COVID-19.  Reviewed results with patient.  CDC guidelines, isolation protocol and ED precautions reviewed with patient.  He says he is not sure if he has been sick 5 or 7 days so I advised him to isolate another 5 days.  Advised to continue the azithromycin and breathing treatments.  I sent prednisone to pharmacy since he says he has not been taking that.  Advised him to connect with PCP so that he can have a telemedicine appointment.  ED precautions thoroughly reviewed with patient.   Final Clinical Impressions(s) / UC Diagnoses   Final diagnoses:  COVID-19  COPD exacerbation ([place])  Cough     Discharge Instructions     Your Covid test is positive.  You need to be isolated an additional 5 days.  Continue the azithromycin as prescribed by your PCP I will send prednisone since you do have COPD.  Continue your inhaler and breathing treatments as needed.  I would advise you pick up over-the-counter Mucinex and start that.  Increase rest and fluid intake.  Inform your PCP that you tested positive for Covid so that they can do telemedicine follow-ups with you.  I would let them know today or tomorrow.  COVID-19 INFECTION: The incubation period of COVID-19 is approximately 14 days after exposure, with most symptoms developing in roughly 4-5 days. Symptoms may range in severity from mild to critically severe. Roughly 80% of those infected will have mild symptoms. People of any age may become infected  with COVID-19 and have the ability to transmit the virus. The most common symptoms include: fever, fatigue, cough, body aches, headaches, sore throat, nasal congestion, shortness of breath, nausea, vomiting, diarrhea, changes in smell and/or taste.    COURSE OF ILLNESS Some patients may begin with mild disease which can progress quickly into critical symptoms. If your symptoms are worsening please call ahead to the Emergency Department and proceed there for further treatment. Recovery time appears to be roughly 1-2 weeks for mild symptoms and 3-6 weeks for severe disease.   GO IMMEDIATELY TO ER FOR FEVER YOU ARE UNABLE TO GET DOWN WITH TYLENOL, BREATHING PROBLEMS, CHEST PAIN, FATIGUE, LETHARGY, INABILITY TO EAT OR DRINK, ETC  QUARANTINE AND ISOLATION: To help decrease the spread of COVID-19 please remain isolated if you have COVID infection or are highly suspected to have COVID infection. This means -stay home and isolate to one room in the home if you live with others. Do not share a bed or bathroom with others while ill, sanitize and wipe down all countertops and keep common areas clean and disinfected. You may discontinue isolation if you have a mild case and are asymptomatic 10 days after symptom onset as long as you have been fever free >24 hours without having to take Motrin or Tylenol. If your case is  more severe (meaning you develop pneumonia or are admitted in the hospital), you may have to isolate longer.   If you have been in close contact (within 6 feet) of someone diagnosed with COVID 19, you are advised to quarantine in your home for 14 days as symptoms can develop anywhere from 2-14 days after exposure to the virus. If you develop symptoms, you  must isolate.  Most current guidelines for COVID after exposure -isolate 10 days if you ARE NOT tested for COVID as long as symptoms do not develop -isolate 7 days if you are tested and remain asymptomatic -You do not necessarily need to be tested  for COVID if you have + exposure and        develop   symptoms. Just isolate at home x10 days from symptom onset During this global pandemic, CDC advises to practice social distancing, try to stay at least 52ft away from others at all times. Wear a face covering. Wash and sanitize your hands regularly and avoid going anywhere that is not necessary.  KEEP IN MIND THAT THE COVID TEST IS NOT 100% ACCURATE AND YOU SHOULD STILL DO EVERYTHING TO PREVENT POTENTIAL SPREAD OF VIRUS TO OTHERS (WEAR MASK, WEAR GLOVES, [place] HANDS AND SANITIZE REGULARLY). IF INITIAL TEST IS NEGATIVE, THIS MAY NOT MEAN YOU ARE DEFINITELY NEGATIVE. MOST ACCURATE TESTING IS DONE 5-7 DAYS AFTER EXPOSURE.   It is not advised by CDC to get re-tested after receiving a positive COVID test since you can still test positive for weeks to months after you have already cleared the virus.   *If you have not been vaccinated for COVID, I strongly suggest you consider getting vaccinated as long as there are no contraindications.      ED Prescriptions    Medication Sig Dispense Auth. Provider   predniSONE (DELTASONE) 20 MG tablet Take 2 tablets (40 mg total) by mouth daily for 5 days. 10 tablet Gretta Cool     PDMP not reviewed this encounter.   Danton Clap, PA-C 01/22/20 1442

## 2020-01-22 NOTE — Discharge Instructions (Signed)
Your Covid test is positive.  You need to be isolated an additional 5 days.  Continue the azithromycin as prescribed by your PCP I will send prednisone since you do have COPD.  Continue your inhaler and breathing treatments as needed.  I would advise you pick up over-the-counter Mucinex and start that.  Increase rest and fluid intake.  Inform your PCP that you tested positive for Covid so that they can do telemedicine follow-ups with you.  I would let them know today or tomorrow.  COVID-19 INFECTION: The incubation period of COVID-19 is approximately 14 days after exposure, with most symptoms developing in roughly 4-5 days. Symptoms may range in severity from mild to critically severe. Roughly 80% of those infected will have mild symptoms. People of any age may become infected with COVID-19 and have the ability to transmit the virus. The most common symptoms include: fever, fatigue, cough, body aches, headaches, sore throat, nasal congestion, shortness of breath, nausea, vomiting, diarrhea, changes in smell and/or taste.    COURSE OF ILLNESS Some patients may begin with mild disease which can progress quickly into critical symptoms. If your symptoms are worsening please call ahead to the Emergency Department and proceed there for further treatment. Recovery time appears to be roughly 1-2 weeks for mild symptoms and 3-6 weeks for severe disease.   GO IMMEDIATELY TO ER FOR FEVER YOU ARE UNABLE TO GET DOWN WITH TYLENOL, BREATHING PROBLEMS, CHEST PAIN, FATIGUE, LETHARGY, INABILITY TO EAT OR DRINK, ETC  QUARANTINE AND ISOLATION: To help decrease the spread of COVID-19 please remain isolated if you have COVID infection or are highly suspected to have COVID infection. This means -stay home and isolate to one room in the home if you live with others. Do not share a bed or bathroom with others while ill, sanitize and wipe down all countertops and keep common areas clean and disinfected. You may discontinue isolation  if you have a mild case and are asymptomatic 10 days after symptom onset as long as you have been fever free >24 hours without having to take Motrin or Tylenol. If your case is more severe (meaning you develop pneumonia or are admitted in the hospital), you may have to isolate longer.   If you have been in close contact (within 6 feet) of someone diagnosed with COVID 19, you are advised to quarantine in your home for 14 days as symptoms can develop anywhere from 2-14 days after exposure to the virus. If you develop symptoms, you  must isolate.  Most current guidelines for COVID after exposure -isolate 10 days if you ARE NOT tested for COVID as long as symptoms do not develop -isolate 7 days if you are tested and remain asymptomatic -You do not necessarily need to be tested for COVID if you have + exposure and        develop   symptoms. Just isolate at home x10 days from symptom onset During this global pandemic, CDC advises to practice social distancing, try to stay at least 36ft away from others at all times. Wear a face covering. Wash and sanitize your hands regularly and avoid going anywhere that is not necessary.  KEEP IN MIND THAT THE COVID TEST IS NOT 100% ACCURATE AND YOU SHOULD STILL DO EVERYTHING TO PREVENT POTENTIAL SPREAD OF VIRUS TO OTHERS (WEAR MASK, WEAR GLOVES, WASH HANDS AND SANITIZE REGULARLY). IF INITIAL TEST IS NEGATIVE, THIS MAY NOT MEAN YOU ARE DEFINITELY NEGATIVE. MOST ACCURATE TESTING IS DONE 5-7 DAYS AFTER EXPOSURE.  It is not advised by CDC to get re-tested after receiving a positive COVID test since you can still test positive for weeks to months after you have already cleared the virus.   *If you have not been vaccinated for COVID, I strongly suggest you consider getting vaccinated as long as there are no contraindications.

## 2020-01-23 ENCOUNTER — Ambulatory Visit (HOSPITAL_COMMUNITY)
Admission: RE | Admit: 2020-01-23 | Discharge: 2020-01-23 | Disposition: A | Discharge status: Home / Self Care | Payer: BC Managed Care – PPO | Source: Ambulatory Visit | Attending: Pulmonary Disease | Admitting: Pulmonary Disease

## 2020-01-23 ENCOUNTER — Other Ambulatory Visit: Payer: Self-pay | Admitting: Physician Assistant

## 2020-01-23 DIAGNOSIS — J438 Other emphysema: Secondary | ICD-10-CM

## 2020-01-23 DIAGNOSIS — U071 COVID-19: Secondary | ICD-10-CM | POA: Diagnosis not present

## 2020-01-23 DIAGNOSIS — E663 Overweight: Secondary | ICD-10-CM

## 2020-01-23 DIAGNOSIS — E1165 Type 2 diabetes mellitus with hyperglycemia: Secondary | ICD-10-CM

## 2020-01-23 DIAGNOSIS — I2510 Atherosclerotic heart disease of native coronary artery without angina pectoris: Secondary | ICD-10-CM

## 2020-01-23 DIAGNOSIS — I10 Essential (primary) hypertension: Secondary | ICD-10-CM

## 2020-01-23 MED ORDER — SODIUM CHLORIDE 0.9 % IV SOLN: INTRAVENOUS | Status: DC | PRN

## 2020-01-23 MED ORDER — EPINEPHRINE 0.3 MG/0.3ML IJ SOAJ: 0.3000 mg | Freq: Once | INTRAMUSCULAR | Status: DC | PRN

## 2020-01-23 MED ORDER — ALBUTEROL SULFATE HFA 108 (90 BASE) MCG/ACT IN AERS: 2.0000 | INHALATION_SPRAY | Freq: Once | RESPIRATORY_TRACT | Status: DC | PRN

## 2020-01-23 MED ORDER — DIPHENHYDRAMINE HCL 50 MG/ML IJ SOLN: 50.0000 mg | Freq: Once | INTRAMUSCULAR | Status: DC | PRN

## 2020-01-23 MED ORDER — METHYLPREDNISOLONE SODIUM SUCC 125 MG IJ SOLR: 125.0000 mg | Freq: Once | INTRAMUSCULAR | Status: DC | PRN

## 2020-01-23 MED ORDER — FAMOTIDINE IN NACL 20-0.9 MG/50ML-% IV SOLN: 20.0000 mg | Freq: Once | INTRAVENOUS | Status: DC | PRN

## 2020-01-23 MED ORDER — SODIUM CHLORIDE 0.9 % IV SOLN: Freq: Once | INTRAVENOUS | Status: AC

## 2020-01-23 NOTE — Progress Notes (Signed)
  Diagnosis: COVID-19  Physician:  Dr. Shan Levans  Procedure: Covid Infusion Clinic Med: casirivimab\imdevimab infusion - Provided patient with casirivimab\imdevimab fact sheet for patients, parents and caregivers prior to infusion.  Complications: No immediate complications noted.  Discharge: Discharged home   Gregary Signs 01/23/2020

## 2020-01-23 NOTE — Progress Notes (Signed)
Patient reviewed Fact Sheet for Patients, Parents, and Caregivers for Emergency Use Authorization (EUA) of Regen-Cov for the Treatment of Coronavirus.  Patient also reviewed and is agreeable to the estimated cost of treatment.  Patient is agreeable to proceed.   

## 2020-01-23 NOTE — Progress Notes (Signed)
I connected by phone with Ricardo Durham on 01/23/2020 at 9:29 AM to discuss the potential use of a new treatment for mild to moderate COVID-19 viral infection in non-hospitalized patients.  This patient is a 64 y.o. male that meets the FDA criteria for Emergency Use Authorization of COVID monoclonal antibody casirivimab/imdevimab, bamlanivimab/etesevimab, or sotrovimab.  Has a (+) direct SARS-CoV-2 viral test result  Has mild or moderate COVID-19   Is NOT hospitalized due to COVID-19  Is within 10 days of symptom onset  Has at least one of the high risk factor(s) for progression to severe COVID-19 and/or hospitalization as defined in EUA.  Specific high risk criteria : BMI > 25, Diabetes, Cardiovascular disease or hypertension, Chronic Lung Disease and Other high risk medical condition per CDC:  unvccinted and high SVI   I have spoken and communicated the following to the patient or parent/caregiver regarding COVID monoclonal antibody treatment:  1. FDA has authorized the emergency use for the treatment of mild to moderate COVID-19 in adults and pediatric patients with positive results of direct SARS-CoV-2 viral testing who are 35 years of age and older weighing at least 40 kg, and who are at high risk for progressing to severe COVID-19 and/or hospitalization.  2. The significant known and potential risks and benefits of COVID monoclonal antibody, and the extent to which such potential risks and benefits are unknown.  3. Information on available alternative treatments and the risks and benefits of those alternatives, including clinical trials.  4. Patients treated with COVID monoclonal antibody should continue to self-isolate and use infection control measures (e.g., wear [place], isolate, social distance, avoid sharing personal items, clean and disinfect "high touch" surfaces, and frequent handwashing) according to CDC guidelines.   5. The patient or parent/caregiver has the option to  accept or refuse COVID monoclonal antibody treatment.  After reviewing this information with the patient, the patient has agreed to receive one of the available covid 19 monoclonal antibodies and will be provided an appropriate fact sheet prior to infusion.   [place], [place] 01/23/2020 9:29 AM

## 2020-01-23 NOTE — Discharge Instructions (Signed)
10 Things You Can Do to Manage Your COVID-19 Symptoms at Home If you have possible or confirmed COVID-19: 1. Stay home from work and school. And stay away from other public places. If you must go out, avoid using any kind of public transportation, ridesharing, or taxis. 2. Monitor your symptoms carefully. If your symptoms get worse, call your healthcare provider immediately. 3. Get rest and stay hydrated. 4. If you have a medical appointment, call the healthcare provider ahead of time and tell them that you have or may have COVID-19. 5. For medical emergencies, call 911 and notify the dispatch personnel that you have or may have COVID-19. 6. Cover your cough and sneezes with a tissue or use the inside of your elbow. 7. Wash your hands often with soap and water for at least 20 seconds or clean your hands with an alcohol-based hand sanitizer that contains at least 60% alcohol. 8. As much as possible, stay in a specific room and away from other people in your home. Also, you should use a separate bathroom, if available. If you need to be around other people in or outside of the home, wear a mask. 9. Avoid sharing personal items with other people in your household, like dishes, towels, and bedding. 10. Clean all surfaces that are touched often, like counters, tabletops, and doorknobs. Use household cleaning sprays or wipes according to the label instructions. cdc.gov/coronavirus 07/24/2018 This information is not intended to replace advice given to you by your health care provider. Make sure you discuss any questions you have with your health care provider. Document Revised: 12/26/2018 Document Reviewed: 12/26/2018 Elsevier Patient Education  2020 Elsevier Inc. What types of side effects do monoclonal antibody drugs cause?  Common side effects  In general, the more common side effects caused by monoclonal antibody drugs include: . Allergic reactions, such as hives or itching . Flu-like signs and  symptoms, including chills, fatigue, fever, and muscle aches and pains . Nausea, vomiting . Diarrhea . Skin rashes . Low blood pressure   The CDC is recommending patients who receive monoclonal antibody treatments wait at least 90 days before being vaccinated.  Currently, there are no data on the safety and efficacy of mRNA COVID-19 vaccines in persons who received monoclonal antibodies or convalescent plasma as part of COVID-19 treatment. Based on the estimated half-life of such therapies as well as evidence suggesting that reinfection is uncommon in the 90 days after initial infection, vaccination should be deferred for at least 90 days, as a precautionary measure until additional information becomes available, to avoid interference of the antibody treatment with vaccine-induced immune responses. If you have any questions or concerns after the infusion please call the Advanced Practice Provider on call at [phone]. This number is ONLY intended for your use regarding questions or concerns about the infusion post-treatment side-effects.  Please do not provide this number to others for use. For return to work notes please contact your primary care provider.   If someone you know is interested in receiving treatment please have them call the COVID hotline at [phone].   

## 2020-01-27 ENCOUNTER — Telehealth: Payer: Self-pay

## 2020-01-27 NOTE — Telephone Encounter (Signed)
Spoke to pt and informed him of the instructions on medications and also the protocol for working.  Informed pt to call us and let us know if he needed anything.

## 2020-01-27 NOTE — Telephone Encounter (Signed)
He was prescribed prednisone in ER and he should go ahead and take it. He should be taking zinc, vitamin c, and vitamin d every day. He should also take OTC medications as indicated to help lower fever and treat acute symptoms. Also, per isolation protocols, he should not go to work until his symptoms are improving AND he has been fever free for five days.

## 2020-01-30 ENCOUNTER — Telehealth: Payer: Self-pay

## 2020-01-30 NOTE — Telephone Encounter (Signed)
Work note done and pt picked up

## 2020-02-13 ENCOUNTER — Other Ambulatory Visit: Payer: Self-pay | Admitting: Adult Health

## 2020-02-13 DIAGNOSIS — E1165 Type 2 diabetes mellitus with hyperglycemia: Secondary | ICD-10-CM

## 2020-02-17 DIAGNOSIS — I10 Essential (primary) hypertension: Secondary | ICD-10-CM | POA: Diagnosis not present

## 2020-02-17 DIAGNOSIS — I2510 Atherosclerotic heart disease of native coronary artery without angina pectoris: Secondary | ICD-10-CM | POA: Diagnosis not present

## 2020-02-17 DIAGNOSIS — E782 Mixed hyperlipidemia: Secondary | ICD-10-CM | POA: Diagnosis not present

## 2020-02-17 DIAGNOSIS — E1159 Type 2 diabetes mellitus with other circulatory complications: Secondary | ICD-10-CM | POA: Diagnosis not present

## 2020-03-12 ENCOUNTER — Telehealth: Payer: Self-pay

## 2020-03-12 ENCOUNTER — Other Ambulatory Visit: Payer: Self-pay

## 2020-03-12 MED ORDER — AZITHROMYCIN 250 MG PO TABS: ORAL_TABLET | ORAL | 0 refills | Status: DC

## 2020-03-12 NOTE — Telephone Encounter (Signed)
Pt called c/o having COPD and yesterday he started with having green mucus, sinus drainage and stated he feels like its gone into my lungs, runny nose, no fever.  Per Lovena Le she sent in zpak to [facility] and pt is to come in for an appt next week per Lovena Le, gave call to [facility] to get scheduled.

## 2020-03-16 ENCOUNTER — Ambulatory Visit (INDEPENDENT_AMBULATORY_CARE_PROVIDER_SITE_OTHER): Payer: BC Managed Care – PPO | Admitting: Hospice and Palliative Medicine

## 2020-03-16 ENCOUNTER — Other Ambulatory Visit: Payer: Self-pay | Admitting: Hospice and Palliative Medicine

## 2020-03-16 ENCOUNTER — Encounter: Payer: Self-pay | Admitting: Hospice and Palliative Medicine

## 2020-03-16 VITALS — BP 124/75 | HR 84 | Temp 97.6°F | Resp 16 | Ht 71.0 in | Wt 221.8 lb

## 2020-03-16 DIAGNOSIS — E1165 Type 2 diabetes mellitus with hyperglycemia: Secondary | ICD-10-CM | POA: Diagnosis not present

## 2020-03-16 DIAGNOSIS — N401 Enlarged prostate with lower urinary tract symptoms: Secondary | ICD-10-CM | POA: Diagnosis not present

## 2020-03-16 DIAGNOSIS — J0140 Acute pansinusitis, unspecified: Secondary | ICD-10-CM

## 2020-03-16 DIAGNOSIS — J449 Chronic obstructive pulmonary disease, unspecified: Secondary | ICD-10-CM | POA: Diagnosis not present

## 2020-03-16 DIAGNOSIS — R350 Frequency of micturition: Secondary | ICD-10-CM

## 2020-03-16 MED ORDER — TAMSULOSIN HCL 0.4 MG PO CAPS: 0.4000 mg | ORAL_CAPSULE | Freq: Every day | ORAL | 3 refills | Status: DC

## 2020-03-16 MED ORDER — INSULIN PEN NEEDLE 32G X 4 MM MISC: 1 refills | Status: AC

## 2020-03-16 MED ORDER — DOXYCYCLINE HYCLATE 100 MG PO TABS
100.0000 mg | ORAL_TABLET | Freq: Two times a day (BID) | ORAL | 0 refills | Status: DC
Start: 2020-03-16 — End: 2020-04-07

## 2020-03-16 MED ORDER — TOUJEO MAX SOLOSTAR 300 UNIT/ML ~~LOC~~ SOPN: 10.0000 [IU] | PEN_INJECTOR | Freq: Every day | SUBCUTANEOUS | 2 refills | Status: DC

## 2020-03-16 NOTE — Progress Notes (Unsigned)
[facility] [place], [place] [address]  Internal MEDICINE  Office Visit Note  Patient Name: Ricardo Durham  [id number]  [id number]  Date of Service: 03/18/2020  Chief Complaint  Patient presents with  . Acute Visit  . Sinus Problem  . Cough    HPI Patient is here for acute sick visit C/o sinus and chest congestion and coughing, symptoms have been ongoing for over a week and not resolving Denies fevers or shortness of breath, recent COVID testing negative Former smoker with significant history of COPD  Wanting to discuss diabetic medications, home glucose readings have been elevated, averaging 200-250 Constantly feels thirsty Currently taking Invokana, glyburide and Janumet   Continues to have issues with his sleep--is up every 2-3 hours to urinate, has trouble back to sleep after waking up--this has been an ongoing issue for many years  Current Medication: Outpatient Encounter Medications as of 03/16/2020  Medication Sig  . albuterol (PROVENTIL) (2.5 MG/3ML) 0.083% nebulizer solution Take 3 mLs (2.5 mg total) by nebulization every 6 (six) hours as needed for wheezing or shortness of breath.  . Allantoin 0.5 % LOTN Apply topically.  . ALOE VERA MOISTURIZING EX Apply topically.  Marland Kitchen aspirin 81 MG tablet Take 81 mg by mouth every other day.  Marland Kitchen atorvastatin (LIPITOR) 80 MG tablet Take 1 tablet (80 mg total) by mouth daily.  Marland Kitchen doxycycline (VIBRA-TABS) 100 MG tablet Take 1 tablet (100 mg total) by mouth 2 (two) times daily.  Marland Kitchen glucose blood (ACCU-CHEK GUIDE) test strip Use as instructed  . Insulin Pen Needle 32G X 4 MM MISC Use as directed, once daily.  . isosorbide mononitrate (IMDUR) 30 MG 24 hr tablet Take 30 mg by mouth daily.  Marland Kitchen lisinopril (PRINIVIL,ZESTRIL) 5 MG tablet Take 5 mg by mouth daily.  . pantoprazole (PROTONIX) 40 MG tablet TAKE 1 TABLET BY MOUTH TWICE A DAY  . sildenafil (REVATIO) 20 MG tablet TAKE 1-4 TABLETS BY MOUTH DAILY AS NEEDED.  Marland Kitchen  sucralfate (CARAFATE) 1 g tablet TAKE 1 TABLET (1 G TOTAL) BY MOUTH 4 (FOUR) TIMES DAILY - WITH MEALS AND AT BEDTIME.  . tamsulosin (FLOMAX) 0.4 MG CAPS capsule Take 1 capsule (0.4 mg total) by mouth daily.  . [DISCONTINUED] canagliflozin (INVOKANA) 100 MG TABS tablet TAKE 1 TABLET (100 MG TOTAL) BY MOUTH DAILY BEFORE BREAKFAST.  . [DISCONTINUED] glyBURIDE (DIABETA) 5 MG tablet TAKE 2 TABLETS BY MOUTH TWICE A DAY  . [DISCONTINUED] insulin glargine, 2 Unit Dial, (TOUJEO MAX SOLOSTAR) 300 UNIT/ML Solostar Pen Inject 10 Units into the skin at bedtime.  . [DISCONTINUED] JANUMET 50-1000 MG tablet TAKE 1 TABLET BY MOUTH 2 (TWO) TIMES DAILY.  . [DISCONTINUED] azithromycin (ZITHROMAX) 250 MG tablet Take 1 tablet by mouth for 10 days   No facility-administered encounter medications on file as of 03/16/2020.    Surgical History: Past Surgical History:  Procedure Laterality Date  . CARDIAC CATHETERIZATION Left 08/24/2015   Procedure: Left Heart Cath and Coronary Angiography;  Surgeon: Corey Skains, MD;  Location: [place] [facility];  Service: Cardiovascular;  Laterality: Left;  . COLONOSCOPY WITH PROPOFOL N/A 10/26/2014   Procedure: COLONOSCOPY WITH PROPOFOL;  Surgeon: Lollie Sails, MD;  Location: [facility] ENDOSCOPY;  Service: Endoscopy;  Laterality: N/A;  . CYST REMOVAL TRUNK N/A   . ESOPHAGOGASTRODUODENOSCOPY (EGD) WITH PROPOFOL N/A 10/26/2014   Procedure: ESOPHAGOGASTRODUODENOSCOPY (EGD) WITH PROPOFOL;  Surgeon: Lollie Sails, MD;  Location: [facility] ENDOSCOPY;  Service: Endoscopy;  Laterality: N/A;  .  NECK SURGERY     c 6-7 due to stable fracture  . NISSEN FUNDOPLICATION    . UMBILICAL HERNIA REPAIR  2001    Medical History: Past Medical History:  Diagnosis Date  . COPD (chronic obstructive pulmonary disease) ([place])   . Diabetes mellitus without complication ([place])   . Hyperlipemia   . Hypertension     Family History: Family History  Problem Relation Age of Onset  . Diabetes Father    . Alzheimer's disease Mother   . Hypertension Mother   . Hyperlipidemia Mother     Social History   Socioeconomic History  . Marital status: Divorced    Spouse name: Not on file  . Number of children: Not on file  . Years of education: Not on file  . Highest education level: Not on file  Occupational History  . Not on file  Tobacco Use  . Smoking status: Former Smoker    Types: Cigarettes    Quit date: 01/22/1980    Years since quitting: 40.1  . Smokeless tobacco: Never Used  Vaping Use  . Vaping Use: Never used  Substance and Sexual Activity  . Alcohol use: Not Currently    Alcohol/week: 1.0 standard drink    Types: 1 Cans of beer per week    Comment: occasionally  . Drug use: No  . Sexual activity: Not on file  Other Topics Concern  . Not on file  Social History Narrative  . Not on file   Social Determinants of Health   Financial Resource Strain: Not on file  Food Insecurity: Not on file  Transportation Needs: Not on file  Physical Activity: Not on file  Stress: Not on file  Social Connections: Not on file  Intimate Partner Violence: Not on file    Review of Systems  Constitutional: Negative for chills, fatigue and unexpected weight change.  HENT: Positive for congestion, sinus pressure and sinus pain. Negative for postnasal drip, rhinorrhea, sneezing and sore throat.   Eyes: Negative for redness.  Respiratory: Positive for cough. Negative for chest tightness and shortness of breath.   Cardiovascular: Negative for chest pain and palpitations.  Gastrointestinal: Negative for abdominal pain, constipation, diarrhea, nausea and vomiting.  Endocrine: Positive for polydipsia and polyuria.  Genitourinary: Positive for frequency. Negative for dysuria.  Musculoskeletal: Negative for arthralgias, back pain, joint swelling and neck pain.  Skin: Negative for rash.  Neurological: Negative for tremors and numbness.  Hematological: Negative for adenopathy. Does not  bruise/bleed easily.  Psychiatric/Behavioral: Negative for behavioral problems (Depression), sleep disturbance and suicidal ideas. The patient is not nervous/anxious.     Vital Signs: BP 124/75   Pulse 84   Temp 97.6 F (36.4 C)   Resp 16   Ht 5\' 11"  (1.803 m)   Wt 221 lb 12.8 oz (100.6 kg)   SpO2 96%   BMI 30.93 kg/m    Physical Exam Vitals reviewed.  Constitutional:      Appearance: Normal appearance. He is normal weight.  Cardiovascular:     Rate and Rhythm: Normal rate and regular rhythm.     Pulses: Normal pulses.     Heart sounds: Normal heart sounds.  Pulmonary:     Effort: Pulmonary effort is normal.     Breath sounds: Normal breath sounds.  Abdominal:     General: Abdomen is flat.     Palpations: Abdomen is soft.  Musculoskeletal:        General: Normal range of motion.  Cervical back: Normal range of motion.  Skin:    General: Skin is dry.  Neurological:     General: No focal deficit present.     Mental Status: He is alert and oriented to person, place, and time. Mental status is at baseline.  Psychiatric:        Mood and Affect: Mood normal.        Behavior: Behavior normal.        Thought Content: Thought content normal.        Judgment: Judgment normal.    Assessment/Plan: 1. Acute non-recurrent pansinusitis Treat with doxycycline due to recent treatment with ZPAK for COVID - doxycycline (VIBRA-TABS) 100 MG tablet; Take 1 tablet (100 mg total) by mouth 2 (two) times daily.  Dispense: 20 tablet; Refill: 0  2. Chronic obstructive pulmonary disease, unspecified COPD type ([name]) Monitor closely Will need updated PFT, CXR  3. Uncontrolled type 2 diabetes mellitus with hyperglycemia (HCC) Increase Invokana to 300 mg daily Start Toujeo 10 units at bedtime Log given to monitor fasting glucose levels and will follow-up in 1 week Needs updated A1C testing - Insulin Pen Needle 32G X 4 MM MISC; Use as directed, once daily.  Dispense: 100 each; Refill:  1  4. Benign prostatic hyperplasia with urinary frequency Start Flomax for urinary symptoms, PSA normal - tamsulosin (FLOMAX) 0.4 MG CAPS capsule; Take 1 capsule (0.4 mg total) by mouth daily.  Dispense: 30 capsule; Refill: 3  General Counseling: Ricardo Durham verbalizes understanding of the findings of todays visit and agrees with plan of treatment. I have discussed any further diagnostic evaluation that may be needed or ordered today. We also reviewed his medications today. he has been encouraged to call the office with any questions or concerns that should arise related to todays visit.  Meds ordered this encounter  Medications  . DISCONTD: insulin glargine, 2 Unit Dial, (TOUJEO MAX SOLOSTAR) 300 UNIT/ML Solostar Pen    Sig: Inject 10 Units into the skin at bedtime.    Dispense:  3 mL    Refill:  2  . Insulin Pen Needle 32G X 4 MM MISC    Sig: Use as directed, once daily.    Dispense:  100 each    Refill:  1  . tamsulosin (FLOMAX) 0.4 MG CAPS capsule    Sig: Take 1 capsule (0.4 mg total) by mouth daily.    Dispense:  30 capsule    Refill:  3  . doxycycline (VIBRA-TABS) 100 MG tablet    Sig: Take 1 tablet (100 mg total) by mouth 2 (two) times daily.    Dispense:  20 tablet    Refill:  0    Time spent: 30 Minutes Time spent includes review of chart, medications, test results and follow-up plan with the patient.  This patient was seen by Theodoro Grist AGNP-C in Collaboration with Dr Lavera Guise as a part of collaborative care agreement     Tanna Furry. Harris AGNP-C Internal medicine

## 2020-03-17 ENCOUNTER — Other Ambulatory Visit: Payer: Self-pay

## 2020-03-17 ENCOUNTER — Other Ambulatory Visit: Payer: Self-pay | Admitting: Hospice and Palliative Medicine

## 2020-03-17 MED ORDER — LANTUS SOLOSTAR 100 UNIT/ML ~~LOC~~ SOPN: PEN_INJECTOR | SUBCUTANEOUS | 1 refills | Status: DC

## 2020-03-17 MED ORDER — INSULIN GLARGINE 100 UNIT/ML ~~LOC~~ SOLN: SUBCUTANEOUS | 1 refills | Status: DC

## 2020-03-17 MED ORDER — INSULIN GLARGINE-YFGN 100 UNIT/ML ~~LOC~~ SOLN: 10.0000 [IU] | Freq: Every day | SUBCUTANEOUS | 1 refills | Status: DC

## 2020-03-17 NOTE — Telephone Encounter (Signed)
Check this out  °

## 2020-03-19 ENCOUNTER — Other Ambulatory Visit: Payer: Self-pay

## 2020-03-19 MED ORDER — DAPAGLIFLOZIN PROPANEDIOL 5 MG PO TABS: 5.0000 mg | ORAL_TABLET | Freq: Every day | ORAL | 0 refills | Status: DC

## 2020-03-22 ENCOUNTER — Telehealth: Payer: Self-pay

## 2020-03-22 NOTE — Telephone Encounter (Signed)
LMOM returning pts call about high blood sugar and med questions

## 2020-03-22 NOTE — Telephone Encounter (Signed)
He is scheduled to see me in the office in Wednesday, we will discuss then. Please ask him to bring ALL of his medications with him to his visit.

## 2020-03-23 NOTE — Telephone Encounter (Signed)
Spoke to Diane, reminded her to bring all meds and to make a list of any concerns to discuss at appt.

## 2020-03-24 ENCOUNTER — Other Ambulatory Visit: Payer: Self-pay

## 2020-03-24 ENCOUNTER — Ambulatory Visit (INDEPENDENT_AMBULATORY_CARE_PROVIDER_SITE_OTHER): Payer: BC Managed Care – PPO | Admitting: Hospice and Palliative Medicine

## 2020-03-24 ENCOUNTER — Encounter: Payer: Self-pay | Admitting: Hospice and Palliative Medicine

## 2020-03-24 VITALS — BP 126/78 | HR 87 | Temp 97.3°F | Resp 16 | Ht 71.0 in | Wt 224.0 lb

## 2020-03-24 DIAGNOSIS — E1165 Type 2 diabetes mellitus with hyperglycemia: Secondary | ICD-10-CM

## 2020-03-24 DIAGNOSIS — I10 Essential (primary) hypertension: Secondary | ICD-10-CM

## 2020-03-24 LAB — POCT GLYCOSYLATED HEMOGLOBIN (HGB A1C): Hemoglobin A1C: 9.9 % — AB (ref 4.0–5.6)

## 2020-03-24 MED ORDER — METFORMIN HCL 500 MG PO TABS: 500.0000 mg | ORAL_TABLET | Freq: Every day | ORAL | 1 refills | Status: DC

## 2020-03-24 MED ORDER — TRULICITY 0.75 MG/0.5ML ~~LOC~~ SOAJ
0.7500 mg | SUBCUTANEOUS | 3 refills | Status: DC
Start: 2020-03-24 — End: 2020-04-07

## 2020-03-24 NOTE — Progress Notes (Signed)
[facility] [place], [place] [address]  Internal MEDICINE  Office Visit Note  Patient Name: Ricardo Durham  [id number]  [id number]  Date of Service: 03/25/2020  Chief Complaint  Patient presents with  . Follow-up    Discuss blood sugar, meds, sinus infection started today, at night will wake up with one side numb, the side he sleeps on, right bicep to fingers are currently numb, right foot also feels numb, pt wants diabetic diet recommendation   . Diabetes  . Hyperlipidemia  . Hypertension  . COPD    HPI Patient is here for routine follow-up Close follow-up for control of glucose Currently taking Farxiga and 10 units of glargine No longer had Invokana at home--Farxiga was sent for replacement Glucose levels have remained elevated--averaging 200-250 Started on Flomax for increased urinary frequency, was only able to take a few doses as it caused dizziness  Accompanied by his sister today who helps manage his medications  Current Medication: Outpatient Encounter Medications as of 03/24/2020  Medication Sig  . Dulaglutide (TRULICITY) 3.24 MW/1.0UV SOPN Inject 0.75 mg into the skin once a week.  . metFORMIN (GLUCOPHAGE) 500 MG tablet Take 1 tablet (500 mg total) by mouth daily with breakfast.  . albuterol (PROVENTIL) (2.5 MG/3ML) 0.083% nebulizer solution Take 3 mLs (2.5 mg total) by nebulization every 6 (six) hours as needed for wheezing or shortness of breath.  . Allantoin 0.5 % LOTN Apply topically.  . ALOE VERA MOISTURIZING EX Apply topically.  Marland Kitchen aspirin 81 MG tablet Take 81 mg by mouth every other day.  Marland Kitchen atorvastatin (LIPITOR) 80 MG tablet Take 1 tablet (80 mg total) by mouth daily.  Marland Kitchen doxycycline (VIBRA-TABS) 100 MG tablet Take 1 tablet (100 mg total) by mouth 2 (two) times daily.  Marland Kitchen glucose blood (ACCU-CHEK GUIDE) test strip Use as instructed  . Insulin Glargine-yfgn 100 UNIT/ML SOLN Inject 10 Units into the skin at bedtime. Inject 10 units  subcutaneous daily at bedtime  . Insulin Pen Needle 32G X 4 MM MISC Use as directed, once daily.  . isosorbide mononitrate (IMDUR) 30 MG 24 hr tablet Take 30 mg by mouth daily.  Marland Kitchen lisinopril (PRINIVIL,ZESTRIL) 5 MG tablet Take 5 mg by mouth daily.  . pantoprazole (PROTONIX) 40 MG tablet TAKE 1 TABLET BY MOUTH TWICE A DAY  . sildenafil (REVATIO) 20 MG tablet TAKE 1-4 TABLETS BY MOUTH DAILY AS NEEDED.  . [DISCONTINUED] dapagliflozin propanediol (FARXIGA) 5 MG TABS tablet Take 1 tablet (5 mg total) by mouth daily.  . [DISCONTINUED] dapagliflozin propanediol (FARXIGA) 5 MG TABS tablet Take 5 mg by mouth daily.  . [DISCONTINUED] sucralfate (CARAFATE) 1 g tablet TAKE 1 TABLET (1 G TOTAL) BY MOUTH 4 (FOUR) TIMES DAILY - WITH MEALS AND AT BEDTIME.  . [DISCONTINUED] tamsulosin (FLOMAX) 0.4 MG CAPS capsule Take 1 capsule (0.4 mg total) by mouth daily.   No facility-administered encounter medications on file as of 03/24/2020.    Surgical History: Past Surgical History:  Procedure Laterality Date  . CARDIAC CATHETERIZATION Left 08/24/2015   Procedure: Left Heart Cath and Coronary Angiography;  Surgeon: Corey Skains, MD;  Location: [place] [facility];  Service: Cardiovascular;  Laterality: Left;  . COLONOSCOPY WITH PROPOFOL N/A 10/26/2014   Procedure: COLONOSCOPY WITH PROPOFOL;  Surgeon: Lollie Sails, MD;  Location: [facility] ENDOSCOPY;  Service: Endoscopy;  Laterality: N/A;  . CYST REMOVAL TRUNK N/A   . ESOPHAGOGASTRODUODENOSCOPY (EGD) WITH PROPOFOL N/A 10/26/2014   Procedure: ESOPHAGOGASTRODUODENOSCOPY (EGD) WITH  PROPOFOL;  Surgeon: Lollie Sails, MD;  Location: [facility] ENDOSCOPY;  Service: Endoscopy;  Laterality: N/A;  . NECK SURGERY     c 6-7 due to stable fracture  . NISSEN FUNDOPLICATION    . UMBILICAL HERNIA REPAIR  2001    Medical History: Past Medical History:  Diagnosis Date  . COPD (chronic obstructive pulmonary disease) ([place])   . Diabetes mellitus without complication ([place])   .  Enlarged prostate   . Hyperlipemia   . Hypertension     Family History: Family History  Problem Relation Age of Onset  . Diabetes Father   . Alzheimer's disease Mother   . Hypertension Mother   . Hyperlipidemia Mother     Social History   Socioeconomic History  . Marital status: Divorced    Spouse name: Not on file  . Number of children: Not on file  . Years of education: Not on file  . Highest education level: Not on file  Occupational History  . Not on file  Tobacco Use  . Smoking status: Former Smoker    Types: Cigarettes    Quit date: 01/22/1980    Years since quitting: 40.2  . Smokeless tobacco: Never Used  Vaping Use  . Vaping Use: Never used  Substance and Sexual Activity  . Alcohol use: Not Currently    Alcohol/week: 1.0 standard drink    Types: 1 Cans of beer per week    Comment: occasionally  . Drug use: No  . Sexual activity: Not on file  Other Topics Concern  . Not on file  Social History Narrative  . Not on file   Social Determinants of Health   Financial Resource Strain: Not on file  Food Insecurity: Not on file  Transportation Needs: Not on file  Physical Activity: Not on file  Stress: Not on file  Social Connections: Not on file  Intimate Partner Violence: Not on file      Review of Systems  Constitutional: Negative for chills, fatigue and unexpected weight change.  HENT: Negative for congestion, postnasal drip, rhinorrhea, sneezing and sore throat.   Eyes: Negative for redness.  Respiratory: Negative for cough, chest tightness and shortness of breath.   Cardiovascular: Negative for chest pain and palpitations.  Gastrointestinal: Negative for abdominal pain, constipation, diarrhea, nausea and vomiting.  Endocrine: Positive for polydipsia, polyphagia and polyuria.  Genitourinary: Negative for dysuria and frequency.  Musculoskeletal: Negative for arthralgias, back pain, joint swelling and neck pain.  Skin: Negative for rash.   Neurological: Negative for tremors and numbness.  Hematological: Negative for adenopathy. Does not bruise/bleed easily.  Psychiatric/Behavioral: Negative for behavioral problems (Depression), sleep disturbance and suicidal ideas. The patient is not nervous/anxious.     Vital Signs: BP 126/78   Pulse 87   Temp (!) 97.3 F (36.3 C)   Resp 16   Ht 5\' 11"  (1.803 m)   Wt 224 lb (101.6 kg)   SpO2 97%   BMI 31.24 kg/m    Physical Exam Vitals reviewed.  Constitutional:      Appearance: Normal appearance. He is normal weight.  Cardiovascular:     Rate and Rhythm: Normal rate and regular rhythm.     Pulses: Normal pulses.     Heart sounds: Normal heart sounds.  Pulmonary:     Effort: Pulmonary effort is normal.     Breath sounds: Normal breath sounds.  Abdominal:     General: Abdomen is flat.     Palpations: Abdomen is soft.  Musculoskeletal:  General: Normal range of motion.     Cervical back: Normal range of motion.  Skin:    General: Skin is warm.  Neurological:     General: No focal deficit present.     Mental Status: He is alert and oriented to person, place, and time. Mental status is at baseline.  Psychiatric:        Mood and Affect: Mood normal.        Behavior: Behavior normal.        Thought Content: Thought content normal.        Judgment: Judgment normal.    Assessment/Plan: 1. Uncontrolled type 2 diabetes mellitus with hyperglycemia (HCC) A1C 9.9 Extensive conversation regarding lifestyle modifications to help control glucose levels Discontinue and avoid SGLT2 due to likely contributing to urinary symptoms, hyperglycemia also likely a component contributing to symptoms Increase long acting insulin to 15 units nightly Start weekly Trulicity and daily Metformin - POCT HgB A1C - Dulaglutide (TRULICITY) 8.12 XN/1.7GY SOPN; Inject 0.75 mg into the skin once a week.  Dispense: 2 mL; Refill: 3 - metFORMIN (GLUCOPHAGE) 500 MG tablet; Take 1 tablet (500 mg  total) by mouth daily with breakfast.  Dispense: 90 tablet; Refill: 1  2. Essential hypertension BP and HR well controlled, continue with current therapy  General Counseling: draydon clairmont understanding of the findings of todays visit and agrees with plan of treatment. I have discussed any further diagnostic evaluation that may be needed or ordered today. We also reviewed his medications today. he has been encouraged to call the office with any questions or concerns that should arise related to todays visit.    Orders Placed This Encounter  Procedures  . POCT HgB A1C    Meds ordered this encounter  Medications  . Dulaglutide (TRULICITY) 1.74 BS/4.9QP SOPN    Sig: Inject 0.75 mg into the skin once a week.    Dispense:  2 mL    Refill:  3  . metFORMIN (GLUCOPHAGE) 500 MG tablet    Sig: Take 1 tablet (500 mg total) by mouth daily with breakfast.    Dispense:  90 tablet    Refill:  1    Time spent: 30 Minutes Time spent includes review of chart, medications, test results and follow-up plan with the patient.  This patient was seen by Theodoro Grist AGNP-C in Collaboration with Dr Lavera Guise as a part of collaborative care agreement     Tanna Furry. Alazia Crocket AGNP-C Internal medicine

## 2020-03-25 ENCOUNTER — Encounter: Payer: Self-pay | Admitting: Hospice and Palliative Medicine

## 2020-03-31 ENCOUNTER — Ambulatory Visit: Payer: BC Managed Care – PPO | Admitting: Hospice and Palliative Medicine

## 2020-03-31 DIAGNOSIS — E119 Type 2 diabetes mellitus without complications: Secondary | ICD-10-CM | POA: Diagnosis not present

## 2020-03-31 DIAGNOSIS — J449 Chronic obstructive pulmonary disease, unspecified: Secondary | ICD-10-CM | POA: Diagnosis not present

## 2020-03-31 DIAGNOSIS — H9193 Unspecified hearing loss, bilateral: Secondary | ICD-10-CM | POA: Diagnosis not present

## 2020-03-31 DIAGNOSIS — I2510 Atherosclerotic heart disease of native coronary artery without angina pectoris: Secondary | ICD-10-CM | POA: Diagnosis not present

## 2020-03-31 DIAGNOSIS — I10 Essential (primary) hypertension: Secondary | ICD-10-CM | POA: Diagnosis not present

## 2020-04-07 ENCOUNTER — Encounter: Payer: Self-pay | Admitting: Hospice and Palliative Medicine

## 2020-04-07 ENCOUNTER — Other Ambulatory Visit: Payer: Self-pay

## 2020-04-07 ENCOUNTER — Ambulatory Visit: Payer: BC Managed Care – PPO | Admitting: Hospice and Palliative Medicine

## 2020-04-07 VITALS — BP 112/74 | HR 94 | Temp 97.0°F | Resp 16 | Ht 70.0 in | Wt 222.2 lb

## 2020-04-07 DIAGNOSIS — I10 Essential (primary) hypertension: Secondary | ICD-10-CM | POA: Diagnosis not present

## 2020-04-07 DIAGNOSIS — E1165 Type 2 diabetes mellitus with hyperglycemia: Secondary | ICD-10-CM | POA: Diagnosis not present

## 2020-04-07 DIAGNOSIS — K219 Gastro-esophageal reflux disease without esophagitis: Secondary | ICD-10-CM | POA: Diagnosis not present

## 2020-04-07 MED ORDER — OZEMPIC (0.25 OR 0.5 MG/DOSE) 2 MG/1.5ML ~~LOC~~ SOPN
0.5000 mg | PEN_INJECTOR | SUBCUTANEOUS | 1 refills | Status: DC
Start: 2020-04-07 — End: 2020-07-27

## 2020-04-07 NOTE — Progress Notes (Signed)
[facility] [place], [place] [address]  Internal MEDICINE  Office Visit Note  Patient Name: Ricardo Durham  [id number]  [id number]  Date of Service: 04/09/2020  Chief Complaint  Patient presents with  . Follow-up    Discuss meds and blood sugar  . Hyperlipidemia  . Hypertension  . Diabetes  . COPD    HPI Patient is here for routine follow-up Close follow-up as we have been working on managing glucose levels Since our last visit has had follow-up with PCP within [place] system--he is now getting all of his medications without charge through [employer] along all medications he has received through [facility] to not take Carafate that was refilled--he stopped at our last visit and symptoms have been controlled without medication Insulin has been changed to Lantus and increased to 15 units per day [facility] provider requesting his Trulicity be changed ot Ozempic due to coverage Metformin dosing increased to 10000 mg BID  Glucose levels since last visit have been averaging 180-250--he has not yet started new regimen prescribed by [facility]   Current Medication: Outpatient Encounter Medications as of 04/07/2020  Medication Sig  . cetirizine (ZYRTEC) 10 MG tablet Take 10 mg by mouth daily.  . fluticasone (VERAMYST) 27.5 MCG/SPRAY nasal spray Place 2 sprays into the nose daily.  . Fluticasone-Salmeterol (ADVAIR) 100-50 MCG/DOSE AEPB Inhale 1 puff into the lungs 2 (two) times daily.  . insulin glargine (LANTUS) 100 UNIT/ML injection Inject 15 Units into the skin daily.  Marland Kitchen lisinopril (ZESTRIL) 10 MG tablet Take 10 mg by mouth daily.  . metFORMIN (GLUCOPHAGE) 1000 MG tablet Take 1,000 mg by mouth 2 (two) times daily with a meal.  . Semaglutide,0.25 or 0.5MG /DOS, (OZEMPIC, 0.25 OR 0.5 MG/DOSE,) 2 MG/1.5ML SOPN Inject 0.5 mg into the skin once a week.  Marland Kitchen albuterol (PROVENTIL) (2.5 MG/3ML) 0.083% nebulizer solution Take 3 mLs (2.5 mg total) by nebulization every 6 (six) hours as  needed for wheezing or shortness of breath.  Marland Kitchen aspirin 81 MG tablet Take 81 mg by mouth every other day.  Marland Kitchen atorvastatin (LIPITOR) 80 MG tablet Take 1 tablet (80 mg total) by mouth daily.  Marland Kitchen glucose blood (ACCU-CHEK GUIDE) test strip Use as instructed  . Insulin Pen Needle 32G X 4 MM MISC Use as directed, once daily.  . isosorbide mononitrate (IMDUR) 30 MG 24 hr tablet Take 30 mg by mouth daily.  . pantoprazole (PROTONIX) 40 MG tablet TAKE 1 TABLET BY MOUTH TWICE A DAY  . sildenafil (REVATIO) 20 MG tablet TAKE 1-4 TABLETS BY MOUTH DAILY AS NEEDED.  . [DISCONTINUED] Allantoin 0.5 % LOTN Apply topically.  . [DISCONTINUED] ALOE VERA MOISTURIZING EX Apply topically.  . [DISCONTINUED] doxycycline (VIBRA-TABS) 100 MG tablet Take 1 tablet (100 mg total) by mouth 2 (two) times daily.  . [DISCONTINUED] Dulaglutide (TRULICITY) 5.46 EV/0.3JK SOPN Inject 0.75 mg into the skin once a week.  . [DISCONTINUED] Insulin Glargine-yfgn 100 UNIT/ML SOLN Inject 10 Units into the skin at bedtime. Inject 10 units subcutaneous daily at bedtime  . [DISCONTINUED] lisinopril (PRINIVIL,ZESTRIL) 5 MG tablet Take 5 mg by mouth daily.  . [DISCONTINUED] metFORMIN (GLUCOPHAGE) 500 MG tablet Take 1 tablet (500 mg total) by mouth daily with breakfast.   No facility-administered encounter medications on file as of 04/07/2020.    Surgical History: Past Surgical History:  Procedure Laterality Date  . CARDIAC CATHETERIZATION Left 08/24/2015   Procedure: Left Heart Cath and Coronary Angiography;  Surgeon: Corey Skains, MD;  Location: [facility];  Service: Cardiovascular;  Laterality: Left;  . COLONOSCOPY WITH PROPOFOL N/A 10/26/2014   Procedure: COLONOSCOPY WITH PROPOFOL;  Surgeon: Lollie Sails, MD;  Location: [facility] ENDOSCOPY;  Service: Endoscopy;  Laterality: N/A;  . CYST REMOVAL TRUNK N/A   . ESOPHAGOGASTRODUODENOSCOPY (EGD) WITH PROPOFOL N/A 10/26/2014   Procedure: ESOPHAGOGASTRODUODENOSCOPY (EGD) WITH PROPOFOL;   Surgeon: Lollie Sails, MD;  Location: [facility] ENDOSCOPY;  Service: Endoscopy;  Laterality: N/A;  . NECK SURGERY     c 6-7 due to stable fracture  . NISSEN FUNDOPLICATION    . UMBILICAL HERNIA REPAIR  2001    Medical History: Past Medical History:  Diagnosis Date  . COPD (chronic obstructive pulmonary disease) ([place])   . Diabetes mellitus without complication ([place])   . Enlarged prostate   . Hyperlipemia   . Hypertension     Family History: Family History  Problem Relation Age of Onset  . Diabetes Father   . Alzheimer's disease Mother   . Hypertension Mother   . Hyperlipidemia Mother     Social History   Socioeconomic History  . Marital status: Divorced    Spouse name: Not on file  . Number of children: Not on file  . Years of education: Not on file  . Highest education level: Not on file  Occupational History  . Not on file  Tobacco Use  . Smoking status: Former Smoker    Types: Cigarettes    Quit date: 01/22/1980    Years since quitting: 40.2  . Smokeless tobacco: Never Used  Vaping Use  . Vaping Use: Never used  Substance and Sexual Activity  . Alcohol use: Not Currently    Alcohol/week: 1.0 standard drink    Types: 1 Cans of beer per week    Comment: occasionally  . Drug use: No  . Sexual activity: Not on file  Other Topics Concern  . Not on file  Social History Narrative  . Not on file   Social Determinants of Health   Financial Resource Strain: Not on file  Food Insecurity: Not on file  Transportation Needs: Not on file  Physical Activity: Not on file  Stress: Not on file  Social Connections: Not on file  Intimate Partner Violence: Not on file      Review of Systems  Constitutional: Negative for chills, fatigue and unexpected weight change.  HENT: Negative for congestion, postnasal drip, rhinorrhea, sneezing and sore throat.   Eyes: Negative for redness.  Respiratory: Negative for cough, chest tightness and shortness of breath.    Cardiovascular: Negative for chest pain and palpitations.  Gastrointestinal: Negative for abdominal pain, constipation, diarrhea, nausea and vomiting.  Genitourinary: Negative for dysuria and frequency.  Musculoskeletal: Negative for arthralgias, back pain, joint swelling and neck pain.  Skin: Negative for rash.  Neurological: Negative for tremors and numbness.  Hematological: Negative for adenopathy. Does not bruise/bleed easily.  Psychiatric/Behavioral: Negative for behavioral problems (Depression), sleep disturbance and suicidal ideas. The patient is not nervous/anxious.     Vital Signs: BP 112/74   Pulse 94   Temp (!) 97 F (36.1 C)   Resp 16   Ht 5\' 10"  (1.778 m)   Wt 222 lb 3.2 oz (100.8 kg)   SpO2 97%   BMI 31.88 kg/m    Physical Exam Vitals reviewed.  Constitutional:      Appearance: Normal appearance. He is normal weight.  Cardiovascular:     Rate and Rhythm: Normal rate and regular rhythm.  Pulses: Normal pulses.     Heart sounds: Normal heart sounds.  Pulmonary:     Effort: Pulmonary effort is normal.     Breath sounds: Normal breath sounds.  Abdominal:     General: Abdomen is flat.     Palpations: Abdomen is soft.  Musculoskeletal:        General: Normal range of motion.     Cervical back: Normal range of motion.  Skin:    General: Skin is warm.  Neurological:     General: No focal deficit present.     Mental Status: He is alert and oriented to person, place, and time. Mental status is at baseline.  Psychiatric:        Mood and Affect: Mood normal.        Behavior: Behavior normal.        Thought Content: Thought content normal.        Judgment: Judgment normal.   Assessment/Plan: 1. Uncontrolled type 2 diabetes mellitus with hyperglycemia ([place]) Stop Trulicity and start Ozempic due to coverage from [facility] with increased dose of Metformin and 15 units Lantus daily - Semaglutide,0.25 or 0.5MG /DOS, (OZEMPIC, 0.25 OR 0.5 MG/DOSE,) 2 MG/1.5ML  SOPN; Inject 0.5 mg into the skin once a week.  Dispense: 3 mL; Refill: 1  2. Essential hypertension BP and HR well controlled, continue to monitor  3. Gastroesophageal reflux disease without esophagitis Continue to hold Carafate, will monitor symptoms  General Counseling: archibald marchetta understanding of the findings of todays visit and agrees with plan of treatment. I have discussed any further diagnostic evaluation that may be needed or ordered today. We also reviewed his medications today. he has been encouraged to call the office with any questions or concerns that should arise related to todays visit.  Meds ordered this encounter  Medications  . Semaglutide,0.25 or 0.5MG /DOS, (OZEMPIC, 0.25 OR 0.5 MG/DOSE,) 2 MG/1.5ML SOPN    Sig: Inject 0.5 mg into the skin once a week.    Dispense:  3 mL    Refill:  1    Time spent: 30 Minutes Time spent includes review of chart, medications, test results and follow-up plan with the patient.  This patient was seen by Theodoro Grist AGNP-C in Collaboration with Dr Lavera Guise as a part of collaborative care agreement     Tanna Furry. Oneil Behney AGNP-C Internal medicine

## 2020-04-09 ENCOUNTER — Encounter: Payer: Self-pay | Admitting: Hospice and Palliative Medicine

## 2020-04-11 ENCOUNTER — Other Ambulatory Visit: Payer: Self-pay | Admitting: Hospice and Palliative Medicine

## 2020-04-14 ENCOUNTER — Telehealth: Payer: Self-pay

## 2020-04-14 NOTE — Telephone Encounter (Signed)
Faxed office notes and prescription for [facility] to [place]: MARIE [phone]  Pt's sister called and requested a new prescription for ozempic bc [facility] doesn't cover the TRULICITY.

## 2020-04-19 ENCOUNTER — Encounter: Payer: Self-pay | Admitting: Hospice and Palliative Medicine

## 2020-04-19 ENCOUNTER — Telehealth: Payer: Self-pay

## 2020-04-19 NOTE — Telephone Encounter (Signed)
Completed medical records for [facility]  Faxed to [phone]

## 2020-04-20 ENCOUNTER — Encounter: Payer: Self-pay | Admitting: Hospice and Palliative Medicine

## 2020-04-22 ENCOUNTER — Encounter: Payer: Self-pay | Admitting: Hospice and Palliative Medicine

## 2020-04-22 ENCOUNTER — Telehealth: Payer: Self-pay

## 2020-04-22 NOTE — Telephone Encounter (Signed)
Changed ozempic to jardiance per [place].  Lovena Le wrote new rx and I faxed to [phone] Attn: Lelan Pons

## 2020-04-22 NOTE — Telephone Encounter (Signed)
Ricardo Durham will be okay.how about victoza or trulicity add on if this is covered

## 2020-04-23 DIAGNOSIS — E119 Type 2 diabetes mellitus without complications: Secondary | ICD-10-CM | POA: Diagnosis not present

## 2020-04-26 DIAGNOSIS — Z024 Encounter for examination for driving license: Secondary | ICD-10-CM | POA: Diagnosis not present

## 2020-04-26 DIAGNOSIS — I2510 Atherosclerotic heart disease of native coronary artery without angina pectoris: Secondary | ICD-10-CM | POA: Diagnosis not present

## 2020-04-28 DIAGNOSIS — E782 Mixed hyperlipidemia: Secondary | ICD-10-CM | POA: Diagnosis not present

## 2020-04-28 DIAGNOSIS — I6523 Occlusion and stenosis of bilateral carotid arteries: Secondary | ICD-10-CM | POA: Diagnosis not present

## 2020-04-28 DIAGNOSIS — I10 Essential (primary) hypertension: Secondary | ICD-10-CM | POA: Diagnosis not present

## 2020-04-28 DIAGNOSIS — I2510 Atherosclerotic heart disease of native coronary artery without angina pectoris: Secondary | ICD-10-CM | POA: Diagnosis not present

## 2020-05-05 DIAGNOSIS — E119 Type 2 diabetes mellitus without complications: Secondary | ICD-10-CM | POA: Diagnosis not present

## 2020-05-05 DIAGNOSIS — I2510 Atherosclerotic heart disease of native coronary artery without angina pectoris: Secondary | ICD-10-CM | POA: Diagnosis not present

## 2020-05-05 DIAGNOSIS — H903 Sensorineural hearing loss, bilateral: Secondary | ICD-10-CM | POA: Diagnosis not present

## 2020-05-05 DIAGNOSIS — J449 Chronic obstructive pulmonary disease, unspecified: Secondary | ICD-10-CM | POA: Diagnosis not present

## 2020-05-06 ENCOUNTER — Encounter: Payer: Self-pay | Admitting: Hospice and Palliative Medicine

## 2020-05-06 ENCOUNTER — Other Ambulatory Visit: Payer: Self-pay

## 2020-05-06 ENCOUNTER — Ambulatory Visit: Payer: BC Managed Care – PPO | Admitting: Hospice and Palliative Medicine

## 2020-05-06 VITALS — BP 106/74 | HR 90 | Temp 97.1°F | Resp 16 | Ht 71.0 in | Wt 218.0 lb

## 2020-05-06 DIAGNOSIS — I10 Essential (primary) hypertension: Secondary | ICD-10-CM

## 2020-05-06 DIAGNOSIS — E1165 Type 2 diabetes mellitus with hyperglycemia: Secondary | ICD-10-CM

## 2020-05-06 DIAGNOSIS — E782 Mixed hyperlipidemia: Secondary | ICD-10-CM | POA: Diagnosis not present

## 2020-05-06 NOTE — Progress Notes (Signed)
[facility] [place], [place] [address]  Internal MEDICINE  Office Visit Note  Patient Name: Ricardo Durham  [id number]  [id number]  Date of Service: 05/06/2020  Chief Complaint  Patient presents with  . Follow-up    DOT  . Hyperlipidemia  . Hypertension  . Diabetes  . COPD    HPI Patient is here for routine follow-up Starting to see improvement in home glucose readings--currently taking Ozempic weekly, metformin as well as 15 units Lantus daily--all medications are now provided through [place] Glucose readings averaging 160-200 fasting Is also scheduled to have further hearing testing and will be set up with hearing aids  Has been seen by cardiologist within [place] and Imdur was discontinued at that appointment, he denies any chest pain, palpitations or shortness of breath since discontinuing medication  BP remains well controlled  Current Medication: Outpatient Encounter Medications as of 05/06/2020  Medication Sig  . albuterol (PROVENTIL) (2.5 MG/3ML) 0.083% nebulizer solution Take 3 mLs (2.5 mg total) by nebulization every 6 (six) hours as needed for wheezing or shortness of breath.  Marland Kitchen aspirin 81 MG tablet Take 81 mg by mouth every other day.  Marland Kitchen atorvastatin (LIPITOR) 80 MG tablet Take 1 tablet (80 mg total) by mouth daily.  . cetirizine (ZYRTEC) 10 MG tablet Take 10 mg by mouth daily.  . empagliflozin (JARDIANCE) 10 MG TABS tablet Take 10 mg by mouth daily.  . fluticasone (VERAMYST) 27.5 MCG/SPRAY nasal spray Place 2 sprays into the nose daily.  . Fluticasone-Salmeterol (ADVAIR) 100-50 MCG/DOSE AEPB Inhale 1 puff into the lungs 2 (two) times daily.  Marland Kitchen glucose blood (ACCU-CHEK GUIDE) test strip Use as instructed  . insulin glargine (LANTUS) 100 UNIT/ML injection Inject 15 Units into the skin daily.  . Insulin Pen Needle 32G X 4 MM MISC Use as directed, once daily.  Marland Kitchen lisinopril (ZESTRIL) 10 MG tablet Take 10 mg by mouth daily.  . metFORMIN (GLUCOPHAGE)  1000 MG tablet Take 1,000 mg by mouth 2 (two) times daily with a meal.  . pantoprazole (PROTONIX) 40 MG tablet TAKE 1 TABLET BY MOUTH TWICE A DAY  . Semaglutide,0.25 or 0.5MG /DOS, (OZEMPIC, 0.25 OR 0.5 MG/DOSE,) 2 MG/1.5ML SOPN Inject 0.5 mg into the skin once a week.  . sildenafil (REVATIO) 20 MG tablet TAKE 1-4 TABLETS BY MOUTH DAILY AS NEEDED.  . [DISCONTINUED] isosorbide mononitrate (IMDUR) 30 MG 24 hr tablet Take 30 mg by mouth daily. (Patient not taking: Reported on 05/06/2020)   No facility-administered encounter medications on file as of 05/06/2020.    Surgical History: Past Surgical History:  Procedure Laterality Date  . CARDIAC CATHETERIZATION Left 08/24/2015   Procedure: Left Heart Cath and Coronary Angiography;  Surgeon: Corey Skains, MD;  Location: [place] [facility];  Service: Cardiovascular;  Laterality: Left;  . COLONOSCOPY WITH PROPOFOL N/A 10/26/2014   Procedure: COLONOSCOPY WITH PROPOFOL;  Surgeon: Lollie Sails, MD;  Location: [facility] ENDOSCOPY;  Service: Endoscopy;  Laterality: N/A;  . CYST REMOVAL TRUNK N/A   . ESOPHAGOGASTRODUODENOSCOPY (EGD) WITH PROPOFOL N/A 10/26/2014   Procedure: ESOPHAGOGASTRODUODENOSCOPY (EGD) WITH PROPOFOL;  Surgeon: Lollie Sails, MD;  Location: [facility] ENDOSCOPY;  Service: Endoscopy;  Laterality: N/A;  . NECK SURGERY     c 6-7 due to stable fracture  . NISSEN FUNDOPLICATION    . UMBILICAL HERNIA REPAIR  2001    Medical History: Past Medical History:  Diagnosis Date  . COPD (chronic obstructive pulmonary disease) ([place])   . Diabetes mellitus  without complication ([place])   . Enlarged prostate   . Hyperlipemia   . Hypertension     Family History: Family History  Problem Relation Age of Onset  . Diabetes Father   . Alzheimer's disease Mother   . Hypertension Mother   . Hyperlipidemia Mother     Social History   Socioeconomic History  . Marital status: Divorced    Spouse name: Not on file  . Number of children: Not on file   . Years of education: Not on file  . Highest education level: Not on file  Occupational History  . Not on file  Tobacco Use  . Smoking status: Former Smoker    Types: Cigarettes    Quit date: 01/22/1980    Years since quitting: 40.3  . Smokeless tobacco: Never Used  Vaping Use  . Vaping Use: Never used  Substance and Sexual Activity  . Alcohol use: Not Currently    Alcohol/week: 1.0 standard drink    Types: 1 Cans of beer per week    Comment: occasionally  . Drug use: No  . Sexual activity: Not on file  Other Topics Concern  . Not on file  Social History Narrative  . Not on file   Social Determinants of Health   Financial Resource Strain: Not on file  Food Insecurity: Not on file  Transportation Needs: Not on file  Physical Activity: Not on file  Stress: Not on file  Social Connections: Not on file  Intimate Partner Violence: Not on file      Review of Systems  Constitutional: Negative for chills, fatigue and unexpected weight change.  HENT: Negative for congestion, postnasal drip, rhinorrhea, sneezing and sore throat.   Eyes: Negative for redness.  Respiratory: Negative for cough, chest tightness and shortness of breath.   Cardiovascular: Negative for chest pain and palpitations.  Gastrointestinal: Negative for abdominal pain, constipation, diarrhea, nausea and vomiting.  Genitourinary: Negative for dysuria and frequency.  Musculoskeletal: Negative for arthralgias, back pain, joint swelling and neck pain.  Skin: Negative for rash.  Neurological: Negative for tremors and numbness.  Hematological: Negative for adenopathy. Does not bruise/bleed easily.  Psychiatric/Behavioral: Negative for behavioral problems (Depression), sleep disturbance and suicidal ideas. The patient is not nervous/anxious.     Vital Signs: BP 106/74   Pulse 90   Temp (!) 97.1 F (36.2 C)   Resp 16   Ht 5\' 11"  (1.803 m)   Wt 218 lb (98.9 kg)   SpO2 97%   BMI 30.40 kg/m    Physical  Exam Vitals reviewed.  Constitutional:      Appearance: Normal appearance. He is normal weight.  Cardiovascular:     Rate and Rhythm: Normal rate and regular rhythm.     Pulses: Normal pulses.     Heart sounds: Normal heart sounds.  Pulmonary:     Effort: Pulmonary effort is normal.     Breath sounds: Normal breath sounds.  Abdominal:     General: Abdomen is flat.     Palpations: Abdomen is soft.  Musculoskeletal:        General: Normal range of motion.     Cervical back: Normal range of motion.  Skin:    General: Skin is warm.  Neurological:     General: No focal deficit present.     Mental Status: He is alert and oriented to person, place, and time. Mental status is at baseline.  Psychiatric:        Mood and Affect: Mood  normal.        Behavior: Behavior normal.        Thought Content: Thought content normal.        Judgment: Judgment normal.    Assessment/Plan: 1. Uncontrolled type 2 diabetes mellitus with hyperglycemia (H[facility]) Increase dosing of Lantus to 18-20 units based on fasting glucose--goal less than 150 fasting Will follow-up with repeat A1C check in 2 months  2. Essential hypertension BP and HR well controlled on present management, continue to monitor  3. Mixed hyperlipidemia Continue with atorvastatin, tolerating well  General Counseling: Ricardo Durham verbalizes understanding of the findings of todays visit and agrees with plan of treatment. I have discussed any further diagnostic evaluation that may be needed or ordered today. We also reviewed his medications today. he has been encouraged to call the office with any questions or concerns that should arise related to todays visit.   Time spent:30 Minutes Time spent includes review of chart, medications, test results and follow-up plan with the patient.  This patient was seen by Theodoro Grist AGNP-C in Collaboration with Dr Lavera Guise as a part of collaborative care agreement     Tanna Furry. Marja Adderley  AGNP-C Internal medicine

## 2020-05-13 ENCOUNTER — Encounter: Payer: Self-pay | Admitting: Hospice and Palliative Medicine

## 2020-05-19 ENCOUNTER — Other Ambulatory Visit: Payer: Self-pay

## 2020-05-19 MED ORDER — INSULIN GLARGINE 100 UNIT/ML ~~LOC~~ SOLN: 20.0000 [IU] | Freq: Every day | SUBCUTANEOUS | 3 refills | Status: DC

## 2020-05-19 NOTE — Telephone Encounter (Signed)
Spoke to pt and he get the Lantus medication from the [place] and he is now taking 20 units of Lantus

## 2020-05-31 ENCOUNTER — Encounter: Payer: Self-pay | Admitting: Hospice and Palliative Medicine

## 2020-06-01 NOTE — Telephone Encounter (Signed)
Called pt and informed him that Lovena Le was no longer at our office. Pt was informed of his next appt.

## 2020-06-01 NOTE — Telephone Encounter (Signed)
Please see this

## 2020-06-03 DIAGNOSIS — L570 Actinic keratosis: Secondary | ICD-10-CM | POA: Diagnosis not present

## 2020-06-03 DIAGNOSIS — C44719 Basal cell carcinoma of skin of left lower limb, including hip: Secondary | ICD-10-CM | POA: Diagnosis not present

## 2020-06-03 DIAGNOSIS — D2261 Melanocytic nevi of right upper limb, including shoulder: Secondary | ICD-10-CM | POA: Diagnosis not present

## 2020-06-03 DIAGNOSIS — Z08 Encounter for follow-up examination after completed treatment for malignant neoplasm: Secondary | ICD-10-CM | POA: Diagnosis not present

## 2020-06-03 DIAGNOSIS — D225 Melanocytic nevi of trunk: Secondary | ICD-10-CM | POA: Diagnosis not present

## 2020-06-03 DIAGNOSIS — C44519 Basal cell carcinoma of skin of other part of trunk: Secondary | ICD-10-CM | POA: Diagnosis not present

## 2020-06-03 DIAGNOSIS — D485 Neoplasm of uncertain behavior of skin: Secondary | ICD-10-CM | POA: Diagnosis not present

## 2020-06-03 DIAGNOSIS — Z85828 Personal history of other malignant neoplasm of skin: Secondary | ICD-10-CM | POA: Diagnosis not present

## 2020-06-11 DIAGNOSIS — Z461 Encounter for fitting and adjustment of hearing aid: Secondary | ICD-10-CM | POA: Diagnosis not present

## 2020-06-23 DIAGNOSIS — L538 Other specified erythematous conditions: Secondary | ICD-10-CM | POA: Diagnosis not present

## 2020-06-23 DIAGNOSIS — L820 Inflamed seborrheic keratosis: Secondary | ICD-10-CM | POA: Diagnosis not present

## 2020-06-23 DIAGNOSIS — C44519 Basal cell carcinoma of skin of other part of trunk: Secondary | ICD-10-CM | POA: Diagnosis not present

## 2020-06-23 DIAGNOSIS — C44719 Basal cell carcinoma of skin of left lower limb, including hip: Secondary | ICD-10-CM | POA: Diagnosis not present

## 2020-07-02 ENCOUNTER — Other Ambulatory Visit: Payer: Self-pay

## 2020-07-02 ENCOUNTER — Telehealth: Payer: Self-pay

## 2020-07-02 MED ORDER — AZITHROMYCIN 250 MG PO TABS: ORAL_TABLET | ORAL | 0 refills | Status: AC

## 2020-07-02 NOTE — Telephone Encounter (Signed)
Pt has had sinus drainage into lungs for 5 days, unable to cough up mucus, has been using nebulizer, both mom and girlfriend were diagnosed with sinus infections, pt took home test 3 days ago neg. For covid, coughing, mucus is clear, headaches, soft stool, stomach has been upset, lasted for 2 days, cold sweets when pt is waking up. Pt also mentions blood sugar has been in the 250 range. Next appt with Alyssa 07-21-20, last appt 05-06-20 with Lovena Le. Pt wants a z pack.

## 2020-07-06 ENCOUNTER — Ambulatory Visit: Payer: BC Managed Care – PPO | Admitting: Internal Medicine

## 2020-07-20 ENCOUNTER — Telehealth: Payer: Self-pay

## 2020-07-20 NOTE — Telephone Encounter (Signed)
Left vm to screen for 07/21/20 appointment-Toni

## 2020-07-21 ENCOUNTER — Encounter: Payer: Self-pay | Admitting: Nurse Practitioner

## 2020-07-21 ENCOUNTER — Other Ambulatory Visit: Payer: Self-pay

## 2020-07-21 ENCOUNTER — Other Ambulatory Visit: Payer: Self-pay | Admitting: Nurse Practitioner

## 2020-07-21 ENCOUNTER — Ambulatory Visit: Payer: BC Managed Care – PPO | Admitting: Nurse Practitioner

## 2020-07-21 VITALS — BP 132/80 | HR 83 | Temp 97.5°F | Resp 16 | Ht 71.0 in | Wt 220.0 lb

## 2020-07-21 DIAGNOSIS — I10 Essential (primary) hypertension: Secondary | ICD-10-CM | POA: Diagnosis not present

## 2020-07-21 DIAGNOSIS — E1165 Type 2 diabetes mellitus with hyperglycemia: Secondary | ICD-10-CM | POA: Diagnosis not present

## 2020-07-21 DIAGNOSIS — Z23 Encounter for immunization: Secondary | ICD-10-CM

## 2020-07-21 DIAGNOSIS — E782 Mixed hyperlipidemia: Secondary | ICD-10-CM | POA: Diagnosis not present

## 2020-07-21 LAB — POCT GLYCOSYLATED HEMOGLOBIN (HGB A1C): Hemoglobin A1C: 9.3 % — AB (ref 4.0–5.6)

## 2020-07-21 MED ORDER — ZOSTER VAC RECOMB ADJUVANTED 50 MCG/0.5ML IM SUSR: 0.5000 mL | Freq: Once | INTRAMUSCULAR | 0 refills | Status: AC

## 2020-07-21 MED ORDER — FREESTYLE LIBRE 2 SENSOR MISC: 3 refills | Status: DC

## 2020-07-21 NOTE — Progress Notes (Signed)
[facility] [place], [place] [address]  Internal MEDICINE  Office Visit Note  Patient Name: Ricardo Durham  [id number]  [id number]  Date of Service: 07/21/2020  Chief Complaint  Patient presents with   Follow-up    Pt wants a sensor to monitor blood sugar, discuss meds    Diabetes   Hyperlipidemia   Hypertension   COPD   Quality Metric Gaps    Shingrix, pneumovax    HPI [name] presents for a follow up visit to check his A1C. He has a history of diabetes, hyperlipidemia, hypertension, and COPD. He is currently 220 lbs, and his BMI is 30.68. His A1C was 9.9 3 months ago. Today his A1C is 9.3. he is taking metformin 1000 mg twice daily with meals. He takes ozempic 0.5 mg weekly injection, and lantus 20 units daily. He found some old 500 mg metformin tablets and started taking them in the middle of the day. Although, this is still within the maximum recommended daily dose of metformin, it is unwise to add medications or extra doses when on several different medications.  He has tried trulicity, [place], jardiance, toujeo, glyburide, janumet, and invokana. He is also interested in getting a continuous glucose monitoring system such as freestyle libre 2 so he can change out the sensor every 2 weeks instead of pricking his finger multiple times per day.  He is also on atorvastatin 80 mg daily and his previous lipid panel was normal except for a slightly elevated LDL of 100 but that does not warrant high intensity statin therapy.    Current Medication: Outpatient Encounter Medications as of 07/21/2020  Medication Sig   albuterol (PROVENTIL) (2.5 MG/3ML) 0.083% nebulizer solution Take 3 mLs (2.5 mg total) by nebulization every 6 (six) hours as needed for wheezing or shortness of breath.   aspirin 81 MG tablet Take 81 mg by mouth every other day.   atorvastatin (LIPITOR) 40 MG tablet Take 1 tablet (40 mg total) by mouth daily.   cetirizine (ZYRTEC) 10 MG tablet Take 10  mg by mouth daily.   fluticasone (VERAMYST) 27.5 MCG/SPRAY nasal spray Place 2 sprays into the nose daily.   Fluticasone-Salmeterol (ADVAIR) 100-50 MCG/DOSE AEPB Inhale 1 puff into the lungs 2 (two) times daily.   glucose blood (ACCU-CHEK GUIDE) test strip Use as instructed   Insulin Pen Needle 32G X 4 MM MISC Use as directed, once daily.   lisinopril (ZESTRIL) 10 MG tablet Take 10 mg by mouth daily.   metFORMIN (GLUCOPHAGE) 1000 MG tablet Take 1,000 mg by mouth 2 (two) times daily with a meal.   pantoprazole (PROTONIX) 40 MG tablet TAKE 1 TABLET BY MOUTH TWICE A DAY   Semaglutide, 1 MG/DOSE, 4 MG/3ML SOPN Inject 1 mg into the skin once a week.   [EXPIRED] Zoster Vaccine Adjuvanted [facility]) injection Inject 0.5 mLs into the muscle once for 1 dose.   [DISCONTINUED] atorvastatin (LIPITOR) 80 MG tablet Take 1 tablet (80 mg total) by mouth daily.   [DISCONTINUED] Continuous Blood Gluc Sensor (FREESTYLE LIBRE 2 SENSOR) MISC by Does not apply route. Use as directed every days DX E11.65   [DISCONTINUED] insulin glargine (LANTUS) 100 UNIT/ML injection Inject 0.2 mLs (20 Units total) into the skin daily. Pt goes thru [place] to get this medication   [DISCONTINUED] Semaglutide,0.25 or 0.5MG /DOS, (OZEMPIC, 0.25 OR 0.5 MG/DOSE,) 2 MG/1.5ML SOPN Inject 0.5 mg into the skin once a week.   insulin glargine (LANTUS) 100 UNIT/ML injection Inject 0.25 mLs (  25 Units total) into the skin daily. Pt goes thru [place] to get this medication   [DISCONTINUED] Continuous Blood Gluc Sensor (FREESTYLE LIBRE 2 SENSOR) MISC Use as directed every days DX E11.65   [DISCONTINUED] sildenafil (REVATIO) 20 MG tablet TAKE 1-4 TABLETS BY MOUTH DAILY AS NEEDED. (Patient not taking: Reported on 07/21/2020)   No facility-administered encounter medications on file as of 07/21/2020.    Surgical History: Past Surgical History:  Procedure Laterality Date   CARDIAC CATHETERIZATION Left 08/24/2015   Procedure: Left Heart Cath and Coronary  Angiography;  Surgeon: Corey Skains, MD;  Location: [place] CV LAB;  Service: Cardiovascular;  Laterality: Left;   COLONOSCOPY WITH PROPOFOL N/A 10/26/2014   Procedure: COLONOSCOPY WITH PROPOFOL;  Surgeon: Lollie Sails, MD;  Location: [facility] ENDOSCOPY;  Service: Endoscopy;  Laterality: N/A;   CYST REMOVAL TRUNK N/A    ESOPHAGOGASTRODUODENOSCOPY (EGD) WITH PROPOFOL N/A 10/26/2014   Procedure: ESOPHAGOGASTRODUODENOSCOPY (EGD) WITH PROPOFOL;  Surgeon: Lollie Sails, MD;  Location: [facility] ENDOSCOPY;  Service: Endoscopy;  Laterality: N/A;   NECK SURGERY     c 6-7 due to stable fracture   NISSEN FUNDOPLICATION     UMBILICAL HERNIA REPAIR  2001    Medical History: Past Medical History:  Diagnosis Date   COPD (chronic obstructive pulmonary disease) ([place])    Diabetes mellitus without complication ([place])    Enlarged prostate    Hyperlipemia    Hypertension     Family History: Family History  Problem Relation Age of Onset   Diabetes Father    Alzheimer's disease Mother    Hypertension Mother    Hyperlipidemia Mother     Social History   Socioeconomic History   Marital status: Divorced    Spouse name: Not on file   Number of children: Not on file   Years of education: Not on file   Highest education level: Not on file  Occupational History   Not on file  Tobacco Use   Smoking status: Former    Pack years: 0.00    Types: Cigarettes    Quit date: 01/22/1980    Years since quitting: 40.5   Smokeless tobacco: Never  Vaping Use   Vaping Use: Never used  Substance and Sexual Activity   Alcohol use: Not Currently    Alcohol/week: 1.0 standard drink    Types: 1 Cans of beer per week    Comment: occasionally   Drug use: No   Sexual activity: Not on file  Other Topics Concern   Not on file  Social History Narrative   Not on file   Social Determinants of Health   Financial Resource Strain: Not on file  Food Insecurity: Not on file  Transportation Needs: Not on  file  Physical Activity: Not on file  Stress: Not on file  Social Connections: Not on file  Intimate Partner Violence: Not on file      Review of Systems  Constitutional:  Negative for chills, fatigue and unexpected weight change.  HENT:  Negative for congestion, rhinorrhea, sneezing and sore throat.   Eyes:  Negative for redness.  Respiratory:  Negative for cough, chest tightness and shortness of breath.   Cardiovascular:  Negative for chest pain and palpitations.  Gastrointestinal:  Negative for abdominal pain, constipation, diarrhea, nausea and vomiting.  Genitourinary:  Negative for dysuria and frequency.  Musculoskeletal:  Negative for arthralgias, back pain, joint swelling and neck pain.  Skin:  Negative for rash.  Neurological: Negative.  Negative for  tremors and numbness.  Hematological:  Negative for adenopathy. Does not bruise/bleed easily.  Psychiatric/Behavioral:  Negative for behavioral problems (Depression), sleep disturbance and suicidal ideas. The patient is not nervous/anxious.    Vital Signs: BP 132/80   Pulse 83   Temp (!) 97.5 F (36.4 C)   Resp 16   Ht 5\' 11"  (1.803 m)   Wt 220 lb (99.8 kg)   SpO2 98%   BMI 30.68 kg/m    Physical Exam Constitutional:      General: He is not in acute distress.    Appearance: Normal appearance. He is well-developed. He is obese. He is not ill-appearing or diaphoretic.  HENT:     Head: Normocephalic and atraumatic.  Neck:     Thyroid: No thyromegaly.     Vascular: No JVD.     Trachea: No tracheal deviation.  Cardiovascular:     Rate and Rhythm: Normal rate and regular rhythm.     Pulses: Normal pulses.     Heart sounds: Normal heart sounds. No murmur heard.   No friction rub. No gallop.  Pulmonary:     Effort: Pulmonary effort is normal. No respiratory distress.     Breath sounds: Normal breath sounds. No wheezing or rales.  Chest:     Chest wall: No tenderness.  Skin:    General: Skin is warm and dry.      Capillary Refill: Capillary refill takes less than 2 seconds.  Neurological:     Mental Status: He is alert and oriented to person, place, and time.     Cranial Nerves: No cranial nerve deficit.  Psychiatric:        Mood and Affect: Mood normal.        Behavior: Behavior normal.    Assessment/Plan: 1. Uncontrolled type 2 diabetes mellitus with hyperglycemia (HCC) A1C is still high but improving. Lantus dose at 25 units daily. Ozempic dose increased to 1mg  weekly. Recheck A1C in 3 months.  - POCT HgB A1C - insulin glargine (LANTUS) 100 UNIT/ML injection; Inject 0.25 mLs (25 Units total) into the skin daily. Pt goes thru [place] to get this medication  Dispense: 10 mL; Refill: 3 - Semaglutide, 1 MG/DOSE, 4 MG/3ML SOPN; Inject 1 mg into the skin once a week.  Dispense: 6 mL; Refill: 3  2. Essential hypertension Stable with current medications.   3. Mixed hyperlipidemia Atorvastatin decreased to 40 mg daily. Will fax to [facility] - atorvastatin (LIPITOR) 40 MG tablet; Take 1 tablet (40 mg total) by mouth daily.  Dispense: 90 tablet; Refill: 3  4. Encounter for vaccination Shingles vaccine ordered as requested.  - Zoster Vaccine Adjuvanted [facility]) injection; Inject 0.5 mLs into the muscle once for 1 dose.  Dispense: 0.5 mL; Refill: 0   General Counseling: Brevyn verbalizes understanding of the findings of todays visit and agrees with plan of treatment. I have discussed any further diagnostic evaluation that may be needed or ordered today. We also reviewed his medications today. he has been encouraged to call the office with any questions or concerns that should arise related to todays visit.    Orders Placed This Encounter  Procedures   POCT HgB A1C    Meds ordered this encounter  Medications   DISCONTD: Continuous Blood Gluc Sensor (FREESTYLE LIBRE 2 SENSOR) MISC    Sig: Use as directed every days DX E11.65    Dispense:  2 each    Refill:  3   Zoster Vaccine Adjuvanted [facility])  injection  Sig: Inject 0.5 mLs into the muscle once for 1 dose.    Dispense:  0.5 mL    Refill:  0   atorvastatin (LIPITOR) 40 MG tablet    Sig: Take 1 tablet (40 mg total) by mouth daily.    Dispense:  90 tablet    Refill:  3    Fax to the [place] pharmacy   insulin glargine (LANTUS) 100 UNIT/ML injection    Sig: Inject 0.25 mLs (25 Units total) into the skin daily. Pt goes thru [place] to get this medication    Dispense:  10 mL    Refill:  3   Semaglutide, 1 MG/DOSE, 4 MG/3ML SOPN    Sig: Inject 1 mg into the skin once a week.    Dispense:  6 mL    Refill:  3    Return in about 3 months (around 10/21/2020) for F/U, Recheck A1C, Skylin Kennerson PCP.   Total time spent:30 Minutes Time spent includes review of chart, medications, test results, and follow up plan with the patient.   [place] Controlled Substance Database was reviewed by me.  This patient was seen by Jonetta Osgood, FNP-C in collaboration with Dr. Clayborn Bigness as a part of collaborative care agreement.   Teneil Shiller R. Valetta Fuller, MSN, FNP-C Internal medicine

## 2020-07-27 MED ORDER — ATORVASTATIN CALCIUM 40 MG PO TABS: 40.0000 mg | ORAL_TABLET | Freq: Every day | ORAL | 3 refills | Status: DC

## 2020-07-27 MED ORDER — INSULIN GLARGINE 100 UNIT/ML ~~LOC~~ SOLN: 25.0000 [IU] | Freq: Every day | SUBCUTANEOUS | 3 refills | Status: DC

## 2020-07-27 MED ORDER — SEMAGLUTIDE (1 MG/DOSE) 4 MG/3ML ~~LOC~~ SOPN
1.0000 mg | PEN_INJECTOR | SUBCUTANEOUS | 3 refills | Status: DC
Start: 2020-07-27 — End: 2020-08-16

## 2020-08-09 DIAGNOSIS — J449 Chronic obstructive pulmonary disease, unspecified: Secondary | ICD-10-CM | POA: Diagnosis not present

## 2020-08-09 DIAGNOSIS — I2510 Atherosclerotic heart disease of native coronary artery without angina pectoris: Secondary | ICD-10-CM | POA: Diagnosis not present

## 2020-08-09 DIAGNOSIS — E119 Type 2 diabetes mellitus without complications: Secondary | ICD-10-CM | POA: Diagnosis not present

## 2020-08-09 DIAGNOSIS — Z23 Encounter for immunization: Secondary | ICD-10-CM | POA: Diagnosis not present

## 2020-08-16 ENCOUNTER — Other Ambulatory Visit: Payer: Self-pay

## 2020-08-16 DIAGNOSIS — E1165 Type 2 diabetes mellitus with hyperglycemia: Secondary | ICD-10-CM

## 2020-08-16 MED ORDER — SEMAGLUTIDE (1 MG/DOSE) 4 MG/3ML ~~LOC~~ SOPN: 1.0000 mg | PEN_INJECTOR | SUBCUTANEOUS | 3 refills | Status: DC

## 2020-08-18 ENCOUNTER — Other Ambulatory Visit: Payer: Self-pay

## 2020-09-17 ENCOUNTER — Other Ambulatory Visit: Payer: Self-pay

## 2020-09-17 MED ORDER — METFORMIN HCL 1000 MG PO TABS: 1000.0000 mg | ORAL_TABLET | Freq: Two times a day (BID) | ORAL | 1 refills | Status: DC

## 2020-10-14 ENCOUNTER — Other Ambulatory Visit: Payer: Self-pay

## 2020-10-14 ENCOUNTER — Ambulatory Visit (INDEPENDENT_AMBULATORY_CARE_PROVIDER_SITE_OTHER): Payer: BC Managed Care – PPO | Admitting: Nurse Practitioner

## 2020-10-14 ENCOUNTER — Encounter: Payer: Self-pay | Admitting: Nurse Practitioner

## 2020-10-14 VITALS — BP 140/75 | HR 80 | Temp 98.1°F | Resp 16 | Ht 71.0 in | Wt 227.2 lb

## 2020-10-14 DIAGNOSIS — E782 Mixed hyperlipidemia: Secondary | ICD-10-CM | POA: Diagnosis not present

## 2020-10-14 DIAGNOSIS — I10 Essential (primary) hypertension: Secondary | ICD-10-CM | POA: Diagnosis not present

## 2020-10-14 DIAGNOSIS — E875 Hyperkalemia: Secondary | ICD-10-CM | POA: Diagnosis not present

## 2020-10-14 DIAGNOSIS — E1165 Type 2 diabetes mellitus with hyperglycemia: Secondary | ICD-10-CM | POA: Diagnosis not present

## 2020-10-14 DIAGNOSIS — E559 Vitamin D deficiency, unspecified: Secondary | ICD-10-CM | POA: Diagnosis not present

## 2020-10-14 DIAGNOSIS — Z0001 Encounter for general adult medical examination with abnormal findings: Secondary | ICD-10-CM

## 2020-10-14 DIAGNOSIS — Z125 Encounter for screening for malignant neoplasm of prostate: Secondary | ICD-10-CM

## 2020-10-14 DIAGNOSIS — K219 Gastro-esophageal reflux disease without esophagitis: Secondary | ICD-10-CM

## 2020-10-14 LAB — POCT GLYCOSYLATED HEMOGLOBIN (HGB A1C): Hemoglobin A1C: 9.2 % — AB (ref 4.0–5.6)

## 2020-10-14 MED ORDER — TIRZEPATIDE 5 MG/0.5ML ~~LOC~~ SOAJ
5.0000 mg | SUBCUTANEOUS | 1 refills | Status: DC
Start: 2020-11-05 — End: 2021-04-28

## 2020-10-14 NOTE — Progress Notes (Signed)
[facility] [place], [place] [address]  Internal MEDICINE  Office Visit Note  Patient Name: Ricardo Durham  [id number]  [id number]  Date of Service: 10/14/2020  Chief Complaint  Patient presents with   Annual Exam   Diabetes   Hyperlipidemia   Hypertension   COPD    HPI Ricardo Durham presents for an annual well visit and physical exam. he has a history of hypertension, CAD, GERD, type 2 diabetes, low testosterone, osteoarthritis, hyperlipidemia, COPD, and enlarged prostate. Surgical history is significant for left heart cath, neck surgery due to fracture, nissen fundoplication, and umbilical hernia repair. He is a former smoker and quit smoking in 1981. He denies alcohol use or recreational drug use. He is due for his foot exam today. His diabetic eye exam was done in April this year. His colonoscopy is not due until 2026. He has not received the COVID vaccine. He denies any pain.  A1C is 9.2 today. This is a slight improvement of 0.1 since June 2022. Glucose levels at home are in the low 100s in the morning but in the 200s in the afternoons.     Current Medication: Outpatient Encounter Medications as of 10/14/2020  Medication Sig   albuterol (PROVENTIL) (2.5 MG/3ML) 0.083% nebulizer solution Take 3 mLs (2.5 mg total) by nebulization every 6 (six) hours as needed for wheezing or shortness of breath.   aspirin 81 MG tablet Take 81 mg by mouth every other day.   atorvastatin (LIPITOR) 40 MG tablet Take 1 tablet (40 mg total) by mouth daily.   cetirizine (ZYRTEC) 10 MG tablet Take 10 mg by mouth daily.   Continuous Blood Gluc Sensor (FREESTYLE LIBRE 2 SENSOR) MISC Use as directed every 14  days DX E11.65   fluticasone (VERAMYST) 27.5 MCG/SPRAY nasal spray Place 2 sprays into the nose daily.   Fluticasone-Salmeterol (ADVAIR) 100-50 MCG/DOSE AEPB Inhale 1 puff into the lungs 2 (two) times daily.   glucose blood (ACCU-CHEK GUIDE) test strip Use as instructed   insulin  glargine (LANTUS) 100 UNIT/ML injection Inject 0.25 mLs (25 Units total) into the skin daily. Pt goes thru [place] to get this medication   Insulin Pen Needle 32G X 4 MM MISC Use as directed, once daily.   lisinopril (ZESTRIL) 10 MG tablet Take 10 mg by mouth daily.   metFORMIN (GLUCOPHAGE) 1000 MG tablet Take 1 tablet (1,000 mg total) by mouth 2 (two) times daily with a meal.   pantoprazole (PROTONIX) 40 MG tablet TAKE 1 TABLET BY MOUTH TWICE A DAY   [START ON 11/05/2020] tirzepatide (MOUNJARO) 5 MG/0.5ML Pen Inject 5 mg into the skin once a week.   [DISCONTINUED] Semaglutide, 1 MG/DOSE, 4 MG/3ML SOPN Inject 1 mg into the skin once a week.   No facility-administered encounter medications on file as of 10/14/2020.    Surgical History: Past Surgical History:  Procedure Laterality Date   CARDIAC CATHETERIZATION Left 08/24/2015   Procedure: Left Heart Cath and Coronary Angiography;  Surgeon: Corey Skains, MD;  Location: [place] CV LAB;  Service: Cardiovascular;  Laterality: Left;   COLONOSCOPY WITH PROPOFOL N/A 10/26/2014   Procedure: COLONOSCOPY WITH PROPOFOL;  Surgeon: Lollie Sails, MD;  Location: [facility] ENDOSCOPY;  Service: Endoscopy;  Laterality: N/A;   CYST REMOVAL TRUNK N/A    ESOPHAGOGASTRODUODENOSCOPY (EGD) WITH PROPOFOL N/A 10/26/2014   Procedure: ESOPHAGOGASTRODUODENOSCOPY (EGD) WITH PROPOFOL;  Surgeon: Lollie Sails, MD;  Location[facility] ENDOSCOPY;  Service: Endoscopy;  Laterality: N/A;   NECK  SURGERY     c 6-7 due to stable fracture   NISSEN FUNDOPLICATION     UMBILICAL HERNIA REPAIR  2001    Medical History: Past Medical History:  Diagnosis Date   COPD (chronic obstructive pulmonary disease) ([place])    Diabetes mellitus without complication ([place])    Enlarged prostate    Hyperlipemia    Hypertension     Family History: Family History  Problem Relation Age of Onset   Diabetes Father    Alzheimer's disease Mother    Hypertension Mother    Hyperlipidemia Mother      Social History   Socioeconomic History   Marital status: Divorced    Spouse name: Not on file   Number of children: Not on file   Years of education: Not on file   Highest education level: Not on file  Occupational History   Not on file  Tobacco Use   Smoking status: Former    Types: Cigarettes    Quit date: 01/22/1980    Years since quitting: 40.7   Smokeless tobacco: Never  Vaping Use   Vaping Use: Never used  Substance and Sexual Activity   Alcohol use: Not Currently    Alcohol/week: 1.0 standard drink    Types: 1 Cans of beer per week    Comment: occasionally   Drug use: No   Sexual activity: Not on file  Other Topics Concern   Not on file  Social History Narrative   Not on file   Social Determinants of Health   Financial Resource Strain: Not on file  Food Insecurity: Not on file  Transportation Needs: Not on file  Physical Activity: Not on file  Stress: Not on file  Social Connections: Not on file  Intimate Partner Violence: Not on file      Review of Systems  Constitutional:  Negative for activity change, appetite change, chills, fatigue, fever and unexpected weight change.  HENT: Negative.  Negative for congestion, ear pain, rhinorrhea, sore throat and trouble swallowing.   Eyes: Negative.   Respiratory: Negative.  Negative for cough, chest tightness, shortness of breath and wheezing.   Cardiovascular: Negative.  Negative for chest pain.  Gastrointestinal: Negative.  Negative for abdominal pain, blood in stool, constipation, diarrhea, nausea and vomiting.  Endocrine: Negative.   Genitourinary: Negative.  Negative for difficulty urinating, dysuria, frequency, hematuria and urgency.  Musculoskeletal: Negative.  Negative for arthralgias, back pain, joint swelling, myalgias and neck pain.  Skin: Negative.  Negative for rash and wound.  Allergic/Immunologic: Negative.  Negative for immunocompromised state.  Neurological: Negative.  Negative for  dizziness, seizures, numbness and headaches.  Hematological: Negative.   Psychiatric/Behavioral: Negative.  Negative for behavioral problems, self-injury and suicidal ideas. The patient is not nervous/anxious.    Vital Signs: BP 140/75   Pulse 80   Temp 98.1 F (36.7 C)   Resp 16   Ht '5\' 11"'  (1.803 m)   Wt 227 lb 3.2 oz (103.1 kg)   SpO2 97%   BMI 31.69 kg/m    Physical Exam Vitals reviewed.  Constitutional:      General: He is awake. He is not in acute distress.    Appearance: Normal appearance. He is well-developed and well-groomed. He is obese. He is not ill-appearing or diaphoretic.  HENT:     Head: Normocephalic and atraumatic.     Right Ear: Tympanic membrane, ear canal and external ear normal.     Left Ear: Tympanic membrane, ear canal and external ear  normal.     Nose: Nose normal. No congestion or rhinorrhea.     Mouth/Throat:     Lips: Pink.     Mouth: Mucous membranes are moist.     Pharynx: Oropharynx is clear. Uvula midline. No oropharyngeal exudate or posterior oropharyngeal erythema.  Eyes:     General: Lids are normal. Vision grossly intact. Gaze aligned appropriately. No scleral icterus.       Right eye: No discharge.        Left eye: No discharge.     Extraocular Movements: Extraocular movements intact.     Conjunctiva/sclera: Conjunctivae normal.     Pupils: Pupils are equal, round, and reactive to light.     Funduscopic exam:    Right eye: Red reflex present.        Left eye: Red reflex present. Neck:     Thyroid: No thyromegaly.     Vascular: No carotid bruit or JVD.     Trachea: No tracheal deviation.  Cardiovascular:     Rate and Rhythm: Normal rate and regular rhythm.     Pulses: Normal pulses.     Heart sounds: Normal heart sounds. No murmur heard.   No friction rub. No gallop.  Pulmonary:     Effort: Pulmonary effort is normal. No respiratory distress.     Breath sounds: Normal breath sounds. No stridor. No wheezing or rales.  Chest:      Chest wall: No tenderness.  Abdominal:     General: Bowel sounds are normal. There is no distension.     Palpations: Abdomen is soft. There is no mass.     Tenderness: There is no abdominal tenderness. There is no guarding or rebound.     Hernia: Hernia: mounjaro.  Musculoskeletal:        General: No tenderness, deformity or signs of injury. Normal range of motion.     Cervical back: Normal range of motion and neck supple.  Lymphadenopathy:     Cervical: No cervical adenopathy.  Skin:    General: Skin is warm and dry.     Capillary Refill: Capillary refill takes less than 2 seconds.     Coloration: Skin is not pale.     Findings: No erythema or rash.  Neurological:     Mental Status: He is alert and oriented to person, place, and time.     Cranial Nerves: No cranial nerve deficit.     Motor: No abnormal muscle tone.     Coordination: Coordination normal.     Gait: Gait normal.     Deep Tendon Reflexes: Reflexes are normal and symmetric.  Psychiatric:        Mood and Affect: Mood normal.        Behavior: Behavior normal. Behavior is cooperative.        Thought Content: Thought content normal.        Judgment: Judgment normal.       Assessment/Plan: 1. Encounter for general adult medical examination with abnormal findings Age-appropriate preventive screenings and vaccinations discussed, annual physical exam completed. Routine labs for health maintenance ordered, see below. PHM updated. He is due for prostate cancer screening and his colonoscopy is due in 2026.   2. Uncontrolled type 2 diabetes mellitus with hyperglycemia (HCC) A1C is still elevated at 9.2 and current medications are not effective. Mounjaro sample provided to patient, and prescription sent to pharmacy. Thyroid labs ordered. He also gained 7 lbs since his last office visit.  - POCT HgB A1C -  tirzepatide [facility]) 5 MG/0.5ML Pen; Inject 5 mg into the skin once a week.  Dispense: 6 mL; Refill: 1 - TSH + free  T4  3. Essential hypertension Blood pressure is stable with current medications  4. Mixed hyperlipidemia Onstatin therapy, repeat lipid panel - Lipid Profile  5. Hyperkalemia History of high potassium level, routine labs ordered - CBC with Differential/Platelet - CMP14+EGFR  6. Vitamin D deficiency Rule out low vitamin D - Vitamin D (25 hydroxy)  7. Encounter for prostate cancer screening Routine screening lab ordered.  - PSA, total and free      General Counseling: Bricen verbalizes understanding of the findings of todays visit and agrees with plan of treatment. I have discussed any further diagnostic evaluation that may be needed or ordered today. We also reviewed his medications today. he has been encouraged to call the office with any questions or concerns that should arise related to todays visit.    Orders Placed This Encounter  Procedures   CBC with Differential/Platelet   CMP14+EGFR   Lipid Profile   TSH + free T4   Vitamin D (25 hydroxy)   PSA, total and free   POCT HgB A1C    Meds ordered this encounter  Medications   tirzepatide (MOUNJARO) 5 MG/0.5ML Pen    Sig: Inject 5 mg into the skin once a week.    Dispense:  6 mL    Refill:  1    Return in about 4 weeks (around 11/11/2020) for F/U, eval new med, Marzell Isakson PCP, cancel 9/29 appt..   Total time spent:30 Minutes Time spent includes review of chart, medications, test results, and follow up plan with the patient.   [place] Controlled Substance Database was reviewed by me.  This patient was seen by Jonetta Osgood, FNP-C in collaboration with Dr. Clayborn Bigness as a part of collaborative care agreement.  Dustie Brittle R. Valetta Fuller, MSN, FNP-C Internal medicine

## 2020-10-18 ENCOUNTER — Encounter: Payer: BC Managed Care – PPO | Admitting: Nurse Practitioner

## 2020-10-20 DIAGNOSIS — E1165 Type 2 diabetes mellitus with hyperglycemia: Secondary | ICD-10-CM | POA: Diagnosis not present

## 2020-10-20 DIAGNOSIS — E875 Hyperkalemia: Secondary | ICD-10-CM | POA: Diagnosis not present

## 2020-10-20 DIAGNOSIS — Z125 Encounter for screening for malignant neoplasm of prostate: Secondary | ICD-10-CM | POA: Diagnosis not present

## 2020-10-20 DIAGNOSIS — E782 Mixed hyperlipidemia: Secondary | ICD-10-CM | POA: Diagnosis not present

## 2020-10-21 ENCOUNTER — Ambulatory Visit: Payer: BC Managed Care – PPO | Admitting: Nurse Practitioner

## 2020-10-21 LAB — CMP14+EGFR
ALT: 16 IU/L (ref 0–44)
AST: 10 IU/L (ref 0–40)
Albumin/Globulin Ratio: 2.2 (ref 1.2–2.2)
Albumin: 4.4 g/dL (ref 3.8–4.8)
Alkaline Phosphatase: 67 IU/L (ref 44–121)
BUN/Creatinine Ratio: 18 (ref 10–24)
BUN: 15 mg/dL (ref 8–27)
Bilirubin Total: 0.4 mg/dL (ref 0.0–1.2)
CO2: 23 mmol/L (ref 20–29)
Calcium: 8.7 mg/dL (ref 8.6–10.2)
Chloride: 104 mmol/L (ref 96–106)
Creatinine, Ser: 0.84 mg/dL (ref 0.76–1.27)
Globulin, Total: 2 g/dL (ref 1.5–4.5)
Glucose: 111 mg/dL — ABNORMAL HIGH (ref 70–99)
Potassium: 4 mmol/L (ref 3.5–5.2)
Sodium: 144 mmol/L (ref 134–144)
Total Protein: 6.4 g/dL (ref 6.0–8.5)
eGFR: 97 mL/min/{1.73_m2} (ref >59)

## 2020-10-21 LAB — PSA, TOTAL AND FREE
PSA, Free Pct: 21.5 %
PSA, Free: 0.28 ng/mL
Prostate Specific Ag, Serum: 1.3 ng/mL (ref 0.0–4.0)

## 2020-10-21 LAB — CBC WITH DIFFERENTIAL/PLATELET
Basophils Absolute: 0.1 10*3/uL (ref 0.0–0.2)
Basos: 1 %
EOS (ABSOLUTE): 0.2 10*3/uL (ref 0.0–0.4)
Eos: 2 %
Hematocrit: 41.5 % (ref 37.5–51.0)
Hemoglobin: 13.6 g/dL (ref 13.0–17.7)
Immature Grans (Abs): 0 10*3/uL (ref 0.0–0.1)
Immature Granulocytes: 0 %
Lymphocytes Absolute: 2.1 10*3/uL (ref 0.7–3.1)
Lymphs: 29 %
MCH: 28.6 pg (ref 26.6–33.0)
MCHC: 32.8 g/dL (ref 31.5–35.7)
MCV: 87 fL (ref 79–97)
Monocytes Absolute: 0.7 10*3/uL (ref 0.1–0.9)
Monocytes: 9 %
Neutrophils Absolute: 4.3 10*3/uL (ref 1.4–7.0)
Neutrophils: 59 %
Platelets: 313 10*3/uL (ref 150–450)
RBC: 4.75 x10E6/uL (ref 4.14–5.80)
RDW: 12.5 % (ref 11.6–15.4)
WBC: 7.3 10*3/uL (ref 3.4–10.8)

## 2020-10-21 LAB — VITAMIN D 25 HYDROXY (VIT D DEFICIENCY, FRACTURES): Vit D, 25-Hydroxy: 26.9 ng/mL — ABNORMAL LOW (ref 30.0–100.0)

## 2020-10-21 LAB — TSH+FREE T4
Free T4: 1.2 ng/dL (ref 0.82–1.77)
TSH: 1.29 u[IU]/mL (ref 0.450–4.500)

## 2020-10-21 LAB — LIPID PANEL
Chol/HDL Ratio: 2.5 ratio (ref 0.0–5.0)
Cholesterol, Total: 111 mg/dL (ref 100–199)
HDL: 44 mg/dL (ref >39)
LDL Chol Calc (NIH): 55 mg/dL (ref 0–99)
Triglycerides: 51 mg/dL (ref 0–149)
VLDL Cholesterol Cal: 12 mg/dL (ref 5–40)

## 2020-11-03 DIAGNOSIS — G4700 Insomnia, unspecified: Secondary | ICD-10-CM | POA: Diagnosis not present

## 2020-11-03 DIAGNOSIS — F419 Anxiety disorder, unspecified: Secondary | ICD-10-CM | POA: Diagnosis not present

## 2020-11-03 DIAGNOSIS — E1165 Type 2 diabetes mellitus with hyperglycemia: Secondary | ICD-10-CM | POA: Diagnosis not present

## 2020-11-03 DIAGNOSIS — Z23 Encounter for immunization: Secondary | ICD-10-CM | POA: Diagnosis not present

## 2020-11-03 DIAGNOSIS — R5383 Other fatigue: Secondary | ICD-10-CM | POA: Diagnosis not present

## 2020-11-05 DIAGNOSIS — R109 Unspecified abdominal pain: Secondary | ICD-10-CM | POA: Diagnosis not present

## 2020-11-05 DIAGNOSIS — R12 Heartburn: Secondary | ICD-10-CM | POA: Diagnosis not present

## 2020-11-05 DIAGNOSIS — K635 Polyp of colon: Secondary | ICD-10-CM | POA: Diagnosis not present

## 2020-11-05 DIAGNOSIS — K626 Ulcer of anus and rectum: Secondary | ICD-10-CM | POA: Diagnosis not present

## 2020-11-08 ENCOUNTER — Other Ambulatory Visit: Payer: Self-pay

## 2020-11-08 ENCOUNTER — Encounter: Payer: BC Managed Care – PPO | Admitting: Nurse Practitioner

## 2020-11-08 ENCOUNTER — Telehealth (INDEPENDENT_AMBULATORY_CARE_PROVIDER_SITE_OTHER): Payer: BC Managed Care – PPO | Admitting: Physician Assistant

## 2020-11-08 ENCOUNTER — Encounter: Payer: Self-pay | Admitting: Nurse Practitioner

## 2020-11-08 DIAGNOSIS — J0100 Acute maxillary sinusitis, unspecified: Secondary | ICD-10-CM | POA: Diagnosis not present

## 2020-11-08 DIAGNOSIS — J449 Chronic obstructive pulmonary disease, unspecified: Secondary | ICD-10-CM | POA: Diagnosis not present

## 2020-11-08 MED ORDER — AZITHROMYCIN 250 MG PO TABS: ORAL_TABLET | ORAL | 0 refills | Status: DC

## 2020-11-08 NOTE — Progress Notes (Signed)
[facility] [place], [place] [address]  Internal MEDICINE  Telephone Visit  Patient Name: Ricardo Durham  [id number]  [id number]  Date of Service: 11/08/2020  I connected with the patient at 4:27 by telephone and verified the patients identity using two identifiers.   I discussed the limitations, risks, security and privacy concerns of performing an evaluation and management service by telephone and the availability of in person appointments. I also discussed with the patient that there may be a patient responsible charge related to the service.  The patient expressed understanding and agrees to proceed.    Chief Complaint  Patient presents with   Telephone Assessment    Home covid test is negative   Telephone Screen    [phone] pt may not be able to do video but will try   Sinusitis    Pt feels he has a sinus infection   Sore Throat    Started this morning, no fever, not tested for covid yet pt is in truck working (truck driver)   mucus drainage    Starting to turn green now is like a brownish color/clear    HPI Pt is here for virtual sick visit -Friday went to [place] and had a scope, and was feeling fine then. -Now this morning has sinus congestion and has been having some wheezing and bad productive cough. He says he did some yard work yesterday and thinks he may have inhaled some debris that triggered this. He reports he has COPD and has a hx of sinusitis and usually has this happen once per year. -Denies fever, chills, body aches -Home covid test negative today -will try mucinex and keep taking his zyrtec and flonase and using inhaler as prescribed  Current Medication: Outpatient Encounter Medications as of 11/08/2020  Medication Sig   albuterol (PROVENTIL) (2.5 MG/3ML) 0.083% nebulizer solution Take 3 mLs (2.5 mg total) by nebulization every 6 (six) hours as needed for wheezing or shortness of breath.   aspirin 81 MG tablet Take 81 mg by mouth  every other day.   atorvastatin (LIPITOR) 40 MG tablet Take 1 tablet (40 mg total) by mouth daily.   azithromycin (ZITHROMAX) 250 MG tablet Take one tab a day for 10 days for uri   cetirizine (ZYRTEC) 10 MG tablet Take 10 mg by mouth daily.   Continuous Blood Gluc Sensor (FREESTYLE LIBRE 2 SENSOR) MISC Use as directed every 14  days DX E11.65   fluticasone (VERAMYST) 27.5 MCG/SPRAY nasal spray Place 2 sprays into the nose daily.   Fluticasone-Salmeterol (ADVAIR) 100-50 MCG/DOSE AEPB Inhale 1 puff into the lungs 2 (two) times daily.   glucose blood (ACCU-CHEK GUIDE) test strip Use as instructed   insulin glargine (LANTUS) 100 UNIT/ML injection Inject 0.25 mLs (25 Units total) into the skin daily. Pt goes thru [place] to get this medication   Insulin Pen Needle 32G X 4 MM MISC Use as directed, once daily.   lisinopril (ZESTRIL) 10 MG tablet Take 10 mg by mouth daily.   metFORMIN (GLUCOPHAGE) 1000 MG tablet Take 1 tablet (1,000 mg total) by mouth 2 (two) times daily with a meal.   pantoprazole (PROTONIX) 40 MG tablet TAKE 1 TABLET BY MOUTH TWICE A DAY   tirzepatide (MOUNJARO) 5 MG/0.5ML Pen Inject 5 mg into the skin once a week.   No facility-administered encounter medications on file as of 11/08/2020.    Surgical History: Past Surgical History:  Procedure Laterality Date   CARDIAC CATHETERIZATION Left  08/24/2015   Procedure: Left Heart Cath and Coronary Angiography;  Surgeon: Corey Skains, MD;  Location: [facility];  Service: Cardiovascular;  Laterality: Left;   COLONOSCOPY WITH PROPOFOL N/A 10/26/2014   Procedure: COLONOSCOPY WITH PROPOFOL;  Surgeon: Lollie Sails, MD;  Location[facility] ENDOSCOPY;  Service: Endoscopy;  Laterality: N/A;   CYST REMOVAL TRUNK N/A    ESOPHAGOGASTRODUODENOSCOPY (EGD) WITH PROPOFOL N/A 10/26/2014   Procedure: ESOPHAGOGASTRODUODENOSCOPY (EGD) WITH PROPOFOL;  Surgeon: Lollie Sails, MD;  Location: [facility] ENDOSCOPY;  Service: Endoscopy;  Laterality: N/A;    NECK SURGERY     c 6-7 due to stable fracture   NISSEN FUNDOPLICATION     UMBILICAL HERNIA REPAIR  2001    Medical History: Past Medical History:  Diagnosis Date   COPD (chronic obstructive pulmonary disease) ([place])    Diabetes mellitus without complication ([place])    Enlarged prostate    Hyperlipemia    Hypertension     Family History: Family History  Problem Relation Age of Onset   Diabetes Father    Alzheimer's disease Mother    Hypertension Mother    Hyperlipidemia Mother     Social History   Socioeconomic History   Marital status: Divorced    Spouse name: Not on file   Number of children: Not on file   Years of education: Not on file   Highest education level: Not on file  Occupational History   Not on file  Tobacco Use   Smoking status: Former    Types: Cigarettes    Quit date: 01/22/1980    Years since quitting: 40.8   Smokeless tobacco: Never  Vaping Use   Vaping Use: Never used  Substance and Sexual Activity   Alcohol use: Not Currently    Alcohol/week: 1.0 standard drink    Types: 1 Cans of beer per week    Comment: occasionally   Drug use: No   Sexual activity: Not on file  Other Topics Concern   Not on file  Social History Narrative   Not on file   Social Determinants of Health   Financial Resource Strain: Not on file  Food Insecurity: Not on file  Transportation Needs: Not on file  Physical Activity: Not on file  Stress: Not on file  Social Connections: Not on file  Intimate Partner Violence: Not on file      Review of Systems  Constitutional:  Negative for fatigue and fever.  HENT:  Positive for congestion, postnasal drip, sinus pain and sneezing. Negative for mouth sores.   Respiratory:  Positive for cough and wheezing. Negative for shortness of breath.   Cardiovascular:  Negative for chest pain.  Genitourinary:  Negative for flank pain.  Psychiatric/Behavioral: Negative.     Vital Signs: There were no vitals taken for this  visit.   Observation/Objective:  Pt is able to carry out conversation   Assessment/Plan: 1. Acute non-recurrent maxillary sinusitis Will start zpak and contiue zyrtec and flonase. May also start mucinex to help congestion. - azithromycin (ZITHROMAX) 250 MG tablet; Take one tab a day for 10 days for uri  Dispense: 10 tablet; Refill: 0  2. Chronic obstructive pulmonary disease, unspecified COPD type ([place]) Continue inhalers as prescribed and as indicated   General Counseling: Inman verbalizes understanding of the findings of today's phone visit and agrees with plan of treatment. I have discussed any further diagnostic evaluation that may be needed or ordered today. We also reviewed his medications today. he has been  encouraged to call the office with any questions or concerns that should arise related to todays visit.    No orders of the defined types were placed in this encounter.   Meds ordered this encounter  Medications   azithromycin (ZITHROMAX) 250 MG tablet    Sig: Take one tab a day for 10 days for uri    Dispense:  10 tablet    Refill:  0    Time spent:25 Minutes    Dr Lavera Guise Internal medicine

## 2020-11-12 ENCOUNTER — Ambulatory Visit: Payer: BC Managed Care – PPO | Admitting: Nurse Practitioner

## 2020-11-19 ENCOUNTER — Other Ambulatory Visit: Payer: Self-pay

## 2020-11-19 DIAGNOSIS — J0100 Acute maxillary sinusitis, unspecified: Secondary | ICD-10-CM

## 2020-11-19 MED ORDER — AZITHROMYCIN 250 MG PO TABS: ORAL_TABLET | ORAL | 0 refills | Status: DC

## 2020-11-19 NOTE — Telephone Encounter (Signed)
Pt LMOM requesting a rx for azithromycin due to his mom having the flu and he is having symptoms, congestion.  Pt informed we sent prescription to pharmacy

## 2020-12-03 ENCOUNTER — Other Ambulatory Visit: Payer: Self-pay

## 2020-12-03 ENCOUNTER — Ambulatory Visit (INDEPENDENT_AMBULATORY_CARE_PROVIDER_SITE_OTHER): Payer: BC Managed Care – PPO | Admitting: Nurse Practitioner

## 2020-12-03 ENCOUNTER — Encounter: Payer: Self-pay | Admitting: Nurse Practitioner

## 2020-12-03 VITALS — BP 140/75 | HR 80 | Temp 97.9°F | Resp 16

## 2020-12-03 DIAGNOSIS — R6889 Other general symptoms and signs: Secondary | ICD-10-CM | POA: Diagnosis not present

## 2020-12-03 DIAGNOSIS — J0100 Acute maxillary sinusitis, unspecified: Secondary | ICD-10-CM | POA: Diagnosis not present

## 2020-12-03 LAB — POCT INFLUENZA A/B
Influenza A, POC: NEGATIVE
Influenza B, POC: NEGATIVE

## 2020-12-03 MED ORDER — AMOXICILLIN-POT CLAVULANATE 875-125 MG PO TABS: 1.0000 | ORAL_TABLET | Freq: Two times a day (BID) | ORAL | 0 refills | Status: DC

## 2020-12-03 NOTE — Progress Notes (Signed)
[facility] [place], [place] [address]  Internal MEDICINE  Office Visit Note  Patient Name: Ricardo Durham  [id number]  [id number]  Date of Service: 12/03/2020  Chief Complaint  Patient presents with   Acute Visit    Exposure to FLU   Cough   Sinusitis     HPI Ricardo Durham presents for an acute sick visit for symptoms of sinusitis. Recently exposed to a person who was diagnosed with flu. Symptoms are similar to recent sinusitis treated with azithromycin in October for which he had a video visit. Reports cough and sinus pressure.    Current Medication:  Outpatient Encounter Medications as of 12/03/2020  Medication Sig   amoxicillin-clavulanate (AUGMENTIN) 875-125 MG tablet Take 1 tablet by mouth 2 (two) times daily for 7 days.   DULoxetine (CYMBALTA) 30 MG capsule TAKE ONE CAPSULE BY MOUTH ONCE EVERY DAY   albuterol (PROVENTIL) (2.5 MG/3ML) 0.083% nebulizer solution Take 3 mLs (2.5 mg total) by nebulization every 6 (six) hours as needed for wheezing or shortness of breath.   aspirin 81 MG tablet Take 81 mg by mouth every other day.   atorvastatin (LIPITOR) 40 MG tablet Take 1 tablet (40 mg total) by mouth daily.   azithromycin (ZITHROMAX) 250 MG tablet Take as directed for 5 days   cetirizine (ZYRTEC) 10 MG tablet Take 10 mg by mouth daily.   Continuous Blood Gluc Sensor (FREESTYLE LIBRE 2 SENSOR) MISC Use as directed every 14  days DX E11.65   fluticasone (VERAMYST) 27.5 MCG/SPRAY nasal spray Place 2 sprays into the nose daily.   Fluticasone-Salmeterol (ADVAIR) 100-50 MCG/DOSE AEPB Inhale 1 puff into the lungs 2 (two) times daily.   glucose blood (ACCU-CHEK GUIDE) test strip Use as instructed   insulin glargine (LANTUS) 100 UNIT/ML injection Inject 0.25 mLs (25 Units total) into the skin daily. Pt goes thru [place] to get this medication   Insulin Pen Needle 32G X 4 MM MISC Use as directed, once daily.   lisinopril (ZESTRIL) 10 MG tablet Take 10 mg by mouth  daily.   metFORMIN (GLUCOPHAGE) 1000 MG tablet Take 1 tablet (1,000 mg total) by mouth 2 (two) times daily with a meal.   pantoprazole (PROTONIX) 40 MG tablet TAKE 1 TABLET BY MOUTH TWICE A DAY   tirzepatide (MOUNJARO) 5 MG/0.5ML Pen Inject 5 mg into the skin once a week.   No facility-administered encounter medications on file as of 12/03/2020.      Medical History: Past Medical History:  Diagnosis Date   COPD (chronic obstructive pulmonary disease) ([place])    Diabetes mellitus without complication ([facility])    Enlarged prostate    Hyperlipemia    Hypertension      Vital Signs: BP 140/75   Pulse 80   Temp 97.9 F (36.6 C)   Resp 16    Review of Systems  Constitutional:  Positive for chills. Negative for fatigue and fever.  HENT:  Positive for congestion, postnasal drip, rhinorrhea, sinus pressure, sinus pain and sneezing. Negative for mouth sores.   Respiratory:  Positive for cough and wheezing. Negative for shortness of breath.   Cardiovascular:  Negative for chest pain.  Gastrointestinal:  Negative for abdominal pain, constipation, diarrhea, nausea and vomiting.  Genitourinary:  Negative for flank pain.  Musculoskeletal:  Negative for myalgias.  Psychiatric/Behavioral: Negative.     Physical Exam Constitutional:      Appearance: Normal appearance.     Comments: Patient came to clinic to be tested for  flu due to exposure and continued symptoms of sinusitis. Patient did not come inside clinic for full exam.   Pulmonary:     Effort: Pulmonary effort is normal. No respiratory distress.  Neurological:     Mental Status: He is alert and oriented to person, place, and time.  Psychiatric:        Mood and Affect: Mood normal.        Behavior: Behavior normal.      Assessment/Plan: 1. Acute non-recurrent maxillary sinusitis Empiric antibiotic treatment prescribed.  - amoxicillin-clavulanate (AUGMENTIN) 875-125 MG tablet; Take 1 tablet by mouth 2 (two) times daily for 7  days.  Dispense: 14 tablet; Refill: 0  2. Flu-like symptoms Negative for flu - POCT Influenza A/B   General Counseling: Marcanthony verbalizes understanding of the findings of todays visit and agrees with plan of treatment. I have discussed any further diagnostic evaluation that may be needed or ordered today. We also reviewed his medications today. he has been encouraged to call the office with any questions or concerns that should arise related to todays visit.    Counseling:    Orders Placed This Encounter  Procedures   POCT Influenza A/B    Meds ordered this encounter  Medications   amoxicillin-clavulanate (AUGMENTIN) 875-125 MG tablet    Sig: Take 1 tablet by mouth 2 (two) times daily for 7 days.    Dispense:  14 tablet    Refill:  0    Return if symptoms worsen or fail to improve.  [place] Controlled Substance Database was reviewed by me for overdose risk score (ORS)  Time spent:15 Minutes Time spent with patient included reviewing progress notes, labs, imaging studies, and discussing plan for follow up.   This patient was seen by Jonetta Osgood, FNP-C in collaboration with Dr. Clayborn Bigness as a part of collaborative care agreement.  Al Bracewell R. Valetta Fuller, MSN, FNP-C Internal Medicine

## 2020-12-09 ENCOUNTER — Encounter: Payer: Self-pay | Admitting: Nurse Practitioner

## 2020-12-10 ENCOUNTER — Other Ambulatory Visit: Payer: Self-pay | Admitting: Nurse Practitioner

## 2020-12-10 DIAGNOSIS — J0100 Acute maxillary sinusitis, unspecified: Secondary | ICD-10-CM

## 2020-12-10 MED ORDER — DOXYCYCLINE HYCLATE 100 MG PO TABS: 100.0000 mg | ORAL_TABLET | Freq: Two times a day (BID) | ORAL | 0 refills | Status: AC

## 2020-12-19 ENCOUNTER — Ambulatory Visit
Admission: RE | Admit: 2020-12-19 | Discharge: 2020-12-19 | Disposition: A | Discharge status: Home / Self Care | Payer: BC Managed Care – PPO | Source: Ambulatory Visit | Attending: Physician Assistant | Admitting: Physician Assistant

## 2020-12-19 ENCOUNTER — Other Ambulatory Visit: Payer: Self-pay

## 2020-12-19 ENCOUNTER — Ambulatory Visit (INDEPENDENT_AMBULATORY_CARE_PROVIDER_SITE_OTHER): Payer: BC Managed Care – PPO

## 2020-12-19 VITALS — BP 120/76 | HR 83 | Temp 97.9°F | Resp 16 | Ht 71.0 in | Wt 227.3 lb

## 2020-12-19 DIAGNOSIS — J449 Chronic obstructive pulmonary disease, unspecified: Secondary | ICD-10-CM | POA: Diagnosis not present

## 2020-12-19 DIAGNOSIS — J441 Chronic obstructive pulmonary disease with (acute) exacerbation: Secondary | ICD-10-CM

## 2020-12-19 DIAGNOSIS — R051 Acute cough: Secondary | ICD-10-CM

## 2020-12-19 DIAGNOSIS — R059 Cough, unspecified: Secondary | ICD-10-CM

## 2020-12-19 MED ORDER — PREDNISONE 20 MG PO TABS: 40.0000 mg | ORAL_TABLET | Freq: Every day | ORAL | 0 refills | Status: AC

## 2020-12-19 MED ORDER — BENZONATATE 200 MG PO CAPS: 200.0000 mg | ORAL_CAPSULE | Freq: Three times a day (TID) | ORAL | 0 refills | Status: DC | PRN

## 2020-12-19 MED ORDER — IPRATROPIUM BROMIDE 0.06 % NA SOLN: 2.0000 | Freq: Four times a day (QID) | NASAL | 0 refills | Status: AC

## 2020-12-19 NOTE — ED Provider Notes (Signed)
[facility]    CSN: [id number] Arrival date & time: 12/19/20  0843      History   Chief Complaint Chief Complaint  Patient presents with   Covid Exposure   Cough   Sinus Problem    HPI Ricardo Durham is a 65 y.o. male with history of COPD.  Patient presents today for 102-month history of postnasal drainage and productive cough.  He reports that he has already taken a Z-Pak and doxycycline for this.  Reports that he initially felt better but does not really feel better overall.  Reports that he felt really bad on Friday when he made the appointment to come in today.  He has not had any fevers.  Denies sinus pain or sore throat.  No chest pain.  Admits to some shortness of breath but no significant change from his baseline.  Has been taking over-the-counter cough medication and using his inhaler and nebulizers as recommended.  Patient reports that his girlfriend tested positive for COVID-19 a couple days ago but he has been around her consistently over the past week.  Reports no worsening of his symptoms in that time and no fevers.  He did take a COVID test at home the other day and it was negative.  He has not been tested for COVID at any other point during his 55-month history of illness.  He has been in contact with his pulmonologist who prescribed antibiotics.  He has not taken prednisone at any point but would like to start that today as well as [employer]  He has no other complaints.  HPI  Past Medical History:  Diagnosis Date   COPD (chronic obstructive pulmonary disease) ([place])    Diabetes mellitus without complication ([place])    Enlarged prostate    Hyperlipemia    Hypertension     Patient Active Problem List   Diagnosis Date Noted   Upper respiratory tract infection 01/19/2020   Exposure to the flu 01/19/2020   Testicular hypofunction 07/04/2018   Generalized osteoarthritis 04/14/2018   Bladder spasm 04/14/2018   Uncontrolled type 2 diabetes mellitus with  hyperglycemia ([place]) 08/31/2017   Cyst of skin and subcutaneous tissue 08/31/2017   Essential hypertension 08/31/2017   Mixed hyperlipidemia 08/31/2017   Diastasis recti 12/07/2015   Groin pain 12/07/2015   CAD (coronary artery disease) 09/28/2015   Benign essential HTN 09/28/2015   Angina decubitus ([place]) 08/10/2015   Abnormal finding on echocardiogram 08/09/2015   Bilateral inguinal hernia without obstruction or gangrene 12/03/2014   Colon polyp 09/17/2014   Diabetes mellitus type 2, controlled ([place]) 09/17/2014   Gastroesophageal reflux disease without esophagitis 09/17/2014    Past Surgical History:  Procedure Laterality Date   CARDIAC CATHETERIZATION Left 08/24/2015   Procedure: Left Heart Cath and Coronary Angiography;  Surgeon: Corey Skains, MD;  Location: [place] [facility];  Service: Cardiovascular;  Laterality: Left;   COLONOSCOPY WITH PROPOFOL N/A 10/26/2014   Procedure: COLONOSCOPY WITH PROPOFOL;  Surgeon: Lollie Sails, MD;  Location: [facility] ENDOSCOPY;  Service: Endoscopy;  Laterality: N/A;   CYST REMOVAL TRUNK N/A    ESOPHAGOGASTRODUODENOSCOPY (EGD) WITH PROPOFOL N/A 10/26/2014   Procedure: ESOPHAGOGASTRODUODENOSCOPY (EGD) WITH PROPOFOL;  Surgeon: Lollie Sails, MD;  Location: [facility] ENDOSCOPY;  Service: Endoscopy;  Laterality: N/A;   NECK SURGERY     c 6-7 due to stable fracture   NISSEN FUNDOPLICATION     UMBILICAL HERNIA REPAIR  2001       Home Medications  Prior to Admission medications   Medication Sig Start Date End Date Taking? Authorizing Provider  aspirin 81 MG tablet Take 81 mg by mouth every other day.   Yes [provider]  atorvastatin (LIPITOR) 40 MG tablet Take 1 tablet (40 mg total) by mouth daily. 07/27/20  Yes Abernathy, Yetta Flock, NP  benzonatate (TESSALON) 200 MG capsule Take 1 capsule (200 mg total) by mouth 3 (three) times daily as needed for cough. 12/19/20  Yes Danton Clap, PA-C  cetirizine (ZYRTEC) 10 MG tablet Take 10 mg by  mouth daily.   Yes [provider]  DULoxetine (CYMBALTA) 30 MG capsule TAKE ONE CAPSULE BY MOUTH ONCE EVERY DAY 11/03/20  Yes [provider]  fluticasone (VERAMYST) 27.5 MCG/SPRAY nasal spray Place 2 sprays into the nose daily.   Yes [provider]  Fluticasone-Salmeterol (ADVAIR) 100-50 MCG/DOSE AEPB Inhale 1 puff into the lungs 2 (two) times daily.   Yes [provider]  insulin glargine (LANTUS) 100 UNIT/ML injection Inject 0.25 mLs (25 Units total) into the skin daily. Pt goes thru [place] to get this medication 07/27/20  Yes Abernathy, Alyssa, NP  ipratropium (ATROVENT) 0.06 % nasal spray Place 2 sprays into both nostrils 4 (four) times daily. 12/19/20  Yes Laurene Footman B, PA-C  lisinopril (ZESTRIL) 10 MG tablet Take 10 mg by mouth daily.   Yes [provider]  metFORMIN (GLUCOPHAGE) 1000 MG tablet Take 1 tablet (1,000 mg total) by mouth 2 (two) times daily with a meal. 09/17/20  Yes Abernathy, Alyssa, NP  pantoprazole (PROTONIX) 40 MG tablet TAKE 1 TABLET BY MOUTH TWICE A DAY 07/01/19  Yes [place], Audie Clear, NP  predniSONE (DELTASONE) 20 MG tablet Take 2 tablets (40 mg total) by mouth daily with breakfast for 5 days. 12/19/20 12/24/20 Yes Danton Clap, PA-C  tirzepatide [facility]) 5 MG/0.5ML Pen Inject 5 mg into the skin once a week. 11/05/20  Yes Abernathy, Yetta Flock, NP  albuterol (PROVENTIL) (2.5 MG/3ML) 0.083% nebulizer solution Take 3 mLs (2.5 mg total) by nebulization every 6 (six) hours as needed for wheezing or shortness of breath. 10/03/18   [name], NP  azithromycin (ZITHROMAX) 250 MG tablet Take as directed for 5 days 11/19/20   Mylinda Latina, PA-C  Continuous Blood Gluc Sensor (FREESTYLE LIBRE 2 SENSOR) MISC Use as directed every 14  days DX E11.65 07/21/20   Jonetta Osgood, NP  glucose blood (ACCU-CHEK GUIDE) test strip Use as instructed 07/10/19   [name], NP  Insulin Pen Needle 32G X 4 MM MISC Use as directed, once  daily. 03/16/20   Luiz Ochoa, NP    Family History Family History  Problem Relation Age of Onset   Diabetes Father    Alzheimer's disease Mother    Hypertension Mother    Hyperlipidemia Mother     Social History Social History   Tobacco Use   Smoking status: Former    Types: Cigarettes    Quit date: 01/22/1980    Years since quitting: 40.9   Smokeless tobacco: Never  Vaping Use   Vaping Use: Never used  Substance Use Topics   Alcohol use: Not Currently    Alcohol/week: 1.0 standard drink    Types: 1 Cans of beer per week    Comment: occasionally   Drug use: No     Allergies   Oxycodone   Review of Systems Review of Systems  Constitutional:  Positive for fatigue. Negative for fever.  HENT:  Positive  for congestion, postnasal drip and rhinorrhea. Negative for sinus pressure, sinus pain and sore throat.   Respiratory:  Positive for cough and shortness of breath.   Cardiovascular:  Negative for chest pain.  Gastrointestinal:  Negative for abdominal pain, diarrhea, nausea and vomiting.  Musculoskeletal:  Negative for myalgias.  Neurological:  Negative for weakness, light-headedness and headaches.  Hematological:  Negative for adenopathy.    Physical Exam Triage Vital Signs ED Triage Vitals  Enc Vitals Group     BP 12/19/20 0901 120/76     Pulse Rate 12/19/20 0901 83     Resp 12/19/20 0901 16     Temp 12/19/20 0901 97.9 F (36.6 C)     Temp Source 12/19/20 0901 Oral     SpO2 12/19/20 0901 96 %     Weight 12/19/20 0858 227 lb 4.7 oz (103.1 kg)     Height 12/19/20 0858 5\' 11"  (1.803 m)     Head Circumference --      Peak Flow --      Pain Score 12/19/20 0858 0     Pain Loc --      Pain Edu? --      Excl. in [place]? --    No data found.  Updated Vital Signs BP 120/76 (BP Location: Left Arm)   Pulse 83   Temp 97.9 F (36.6 C) (Oral)   Resp 16   Ht 5\' 11"  (1.803 m)   Wt 227 lb 4.7 oz (103.1 kg)   SpO2 96%   BMI 31.70 kg/m      Physical  Exam Vitals and nursing note reviewed.  Constitutional:      General: He is not in acute distress.    Appearance: Normal appearance. He is well-developed. He is not ill-appearing.  HENT:     Head: Normocephalic and atraumatic.     Nose: Congestion present.     Mouth/Throat:     Mouth: Mucous membranes are moist.     Pharynx: Oropharynx is clear. Posterior oropharyngeal erythema (mild with clear PND) present.  Eyes:     General: No scleral icterus.    Conjunctiva/sclera: Conjunctivae normal.  Cardiovascular:     Rate and Rhythm: Normal rate and regular rhythm.     Heart sounds: Normal heart sounds.  Pulmonary:     Effort: Pulmonary effort is normal. No respiratory distress.     Breath sounds: Normal breath sounds. No wheezing, rhonchi or rales.     Comments: Mildly reduced breath sounds throughout Musculoskeletal:        General: No swelling.     Cervical back: Neck supple.  Skin:    General: Skin is warm and dry.     Capillary Refill: Capillary refill takes less than 2 seconds.  Neurological:     General: No focal deficit present.     Mental Status: He is alert. Mental status is at baseline.     Motor: No weakness.     Coordination: Coordination normal.     Gait: Gait normal.  Psychiatric:        Mood and Affect: Mood normal.        Behavior: Behavior normal.        Thought Content: Thought content normal.     UC Treatments / Results  Labs (all labs ordered are listed, but only abnormal results are displayed) Labs Reviewed - No data to display  EKG   Radiology DG Chest 2 View  Result Date: 12/19/2020 CLINICAL DATA:  Cough for  2 weeks. Chest congestion for 2 months. COPD. EXAM: CHEST - 2 VIEW COMPARISON:  01/22/2020 FINDINGS: The heart size and mediastinal contours are within normal limits. Aortic atherosclerotic calcification noted. Pulmonary hyperinflation is again seen, consistent with COPD. Both lungs are clear. Cervical spine fusion hardware again noted.  IMPRESSION: COPD. No active cardiopulmonary disease. Electronically Signed   By: Marlaine Hind M.D.   On: 12/19/2020 09:58    Procedures Procedures (including critical care time)  Medications Ordered in UC Medications - No data to display  Initial Impression / Assessment and Plan / UC Course  I have reviewed the triage vital signs and the nursing notes.  Pertinent labs & imaging results that were available during my care of the patient were reviewed by me and considered in my medical decision making (see chart for details).   65 y/o presenting with 2 month history of cough, postnasal drainage and congestion.  Patient has already taken doxycycline and azithromycin without any improvement in symptoms.  Today vital signs are stable and he is overall well-appearing.  Only has mild nasal congestion and mild posterior pharyngeal erythema with clear postnasal drainage on exam.  He has mildly reduced breath sounds throughout but no abnormal breath sounds on examination.  Chest x-ray performed today does not show any evidence of pneumonia.  Discussed this with patient.  We also discussed testing for COVID-19 but given how long he has been sick I am unsure as to when his symptoms would have started if he were to be positive for COVID-19 and he is not feeling any worse.  Patient will continue to test himself at home test and follow CDC guidelines, isolation protocol and ED precautions if COVID-positive at any point.  At this time, we will treat him with prednisone since he has not had that but advised him close monitoring of his blood glucose.  Also treating with Atrovent nasal spray and benzonatate.  Advised increasing rest and fluids and to follow-up with pulmonologist if not improving.  ED precautions reviewed.   Final Clinical Impressions(s) / UC Diagnoses   Final diagnoses:  COPD exacerbation ([place])  Acute cough     Discharge Instructions      -Your chest x-ray is normal today. - You have  already been on multiple different antibiotics and there is no evidence of pneumonia. - I have sent a nasal spray and prednisone for you.  Since you are diabetic it is important that you monitor your blood sugars and if they are unmanageable in the prednisone, you notify your PCP and have help managing your blood sugar or discontinue your prednisone and let us know or your pulmonologist. - If you are not feeling better with the prednisone, reach out to your pulmonologist.  I hesitate to send in more antibiotics as I do not think you need them at this point. - Increase rest and fluids and continue using your inhalers as needed. - If you develop a fever, worsening cough or increased breathing difficulty, please return or see pulmonologist or emergency department.     ED Prescriptions     Medication Sig Dispense Auth. Provider   predniSONE (DELTASONE) 20 MG tablet Take 2 tablets (40 mg total) by mouth daily with breakfast for 5 days. 10 tablet Laurene Footman B, PA-C   ipratropium (ATROVENT) 0.06 % nasal spray Place 2 sprays into both nostrils 4 (four) times daily. 15 mL Laurene Footman B, PA-C   benzonatate (TESSALON) 200 MG capsule Take 1 capsule (200 mg total)  by mouth 3 (three) times daily as needed for cough. 30 capsule Danton Clap, PA-C      PDMP not reviewed this encounter.   Danton Clap, PA-C 12/19/20 1017

## 2020-12-19 NOTE — Discharge Instructions (Signed)
-  Your chest x-ray is normal today. - You have already been on multiple different antibiotics and there is no evidence of pneumonia. - I have sent a nasal spray and prednisone for you.  Since you are diabetic it is important that you monitor your blood sugars and if they are unmanageable in the prednisone, you notify your PCP and have help managing your blood sugar or discontinue your prednisone and let us know or your pulmonologist. - If you are not feeling better with the prednisone, reach out to your pulmonologist.  I hesitate to send in more antibiotics as I do not think you need them at this point. - Increase rest and fluids and continue using your inhalers as needed. - If you develop a fever, worsening cough or increased breathing difficulty, please return or see pulmonologist or emergency department.

## 2020-12-19 NOTE — ED Triage Notes (Signed)
Patient c/o sinus congestion and pressure and cough that started over a week ago.  Patient has one more day left on his Doxycycline.  Patient states that he was previously on a Z-Pack before the Doxycycline.  Patient states that his live in girlfriend tested positive for COVID this morning.  Patient states that he would like some Tessalon Perles.

## 2020-12-26 ENCOUNTER — Encounter: Payer: Self-pay | Admitting: Nurse Practitioner

## 2020-12-28 ENCOUNTER — Other Ambulatory Visit: Payer: Self-pay | Admitting: Nurse Practitioner

## 2020-12-28 DIAGNOSIS — R0989 Other specified symptoms and signs involving the circulatory and respiratory systems: Secondary | ICD-10-CM

## 2020-12-28 DIAGNOSIS — R052 Subacute cough: Secondary | ICD-10-CM

## 2020-12-28 DIAGNOSIS — J449 Chronic obstructive pulmonary disease, unspecified: Secondary | ICD-10-CM

## 2021-01-12 ENCOUNTER — Encounter: Payer: BC Managed Care – PPO | Admitting: Nurse Practitioner

## 2021-01-26 ENCOUNTER — Encounter: Payer: Self-pay | Admitting: Nurse Practitioner

## 2021-03-04 DIAGNOSIS — E119 Type 2 diabetes mellitus without complications: Secondary | ICD-10-CM | POA: Diagnosis not present

## 2021-03-04 DIAGNOSIS — I10 Essential (primary) hypertension: Secondary | ICD-10-CM | POA: Diagnosis not present

## 2021-03-04 DIAGNOSIS — G4700 Insomnia, unspecified: Secondary | ICD-10-CM | POA: Diagnosis not present

## 2021-03-04 DIAGNOSIS — I2510 Atherosclerotic heart disease of native coronary artery without angina pectoris: Secondary | ICD-10-CM | POA: Diagnosis not present

## 2021-03-04 DIAGNOSIS — Z23 Encounter for immunization: Secondary | ICD-10-CM | POA: Diagnosis not present

## 2021-03-09 ENCOUNTER — Other Ambulatory Visit: Payer: Self-pay | Admitting: Nurse Practitioner

## 2021-03-11 DIAGNOSIS — Z0189 Encounter for other specified special examinations: Secondary | ICD-10-CM | POA: Diagnosis not present

## 2021-03-11 DIAGNOSIS — E1165 Type 2 diabetes mellitus with hyperglycemia: Secondary | ICD-10-CM | POA: Diagnosis not present

## 2021-03-11 DIAGNOSIS — I10 Essential (primary) hypertension: Secondary | ICD-10-CM | POA: Diagnosis not present

## 2021-03-16 ENCOUNTER — Encounter: Payer: Self-pay | Admitting: Nurse Practitioner

## 2021-03-16 ENCOUNTER — Other Ambulatory Visit: Payer: Self-pay

## 2021-03-16 ENCOUNTER — Telehealth: Payer: Self-pay

## 2021-03-16 MED ORDER — ONDANSETRON HCL 4 MG PO TABS: 4.0000 mg | ORAL_TABLET | Freq: Three times a day (TID) | ORAL | 0 refills | Status: DC | PRN

## 2021-03-16 NOTE — Telephone Encounter (Signed)
Pt called that having stomach virus vomiting and diarrhea and nausea,no fever as per alyssa send Zofran and advised him that he can drink plenty of fluids and eat Brat diet and he can take OTC imodium as need and if he is not feeling better need appt

## 2021-03-21 DIAGNOSIS — R931 Abnormal findings on diagnostic imaging of heart and coronary circulation: Secondary | ICD-10-CM | POA: Diagnosis not present

## 2021-03-21 DIAGNOSIS — I2510 Atherosclerotic heart disease of native coronary artery without angina pectoris: Secondary | ICD-10-CM | POA: Diagnosis not present

## 2021-03-22 ENCOUNTER — Encounter: Payer: Self-pay | Admitting: Nurse Practitioner

## 2021-03-22 DIAGNOSIS — I6523 Occlusion and stenosis of bilateral carotid arteries: Secondary | ICD-10-CM | POA: Diagnosis not present

## 2021-03-22 DIAGNOSIS — R931 Abnormal findings on diagnostic imaging of heart and coronary circulation: Secondary | ICD-10-CM | POA: Diagnosis not present

## 2021-03-22 DIAGNOSIS — I10 Essential (primary) hypertension: Secondary | ICD-10-CM | POA: Diagnosis not present

## 2021-03-22 DIAGNOSIS — E782 Mixed hyperlipidemia: Secondary | ICD-10-CM | POA: Diagnosis not present

## 2021-03-22 DIAGNOSIS — Z024 Encounter for examination for driving license: Secondary | ICD-10-CM | POA: Diagnosis not present

## 2021-03-22 DIAGNOSIS — I2510 Atherosclerotic heart disease of native coronary artery without angina pectoris: Secondary | ICD-10-CM | POA: Diagnosis not present

## 2021-04-07 ENCOUNTER — Telehealth: Payer: Self-pay

## 2021-04-07 ENCOUNTER — Other Ambulatory Visit: Payer: Self-pay | Admitting: Nurse Practitioner

## 2021-04-07 NOTE — Telephone Encounter (Signed)
Pt need appt for refills  ?

## 2021-04-07 NOTE — Telephone Encounter (Signed)
Spoke with pt that he need follow up appt he said he will call back and make appt  ?

## 2021-04-28 ENCOUNTER — Encounter: Payer: Self-pay | Admitting: Nurse Practitioner

## 2021-04-28 ENCOUNTER — Ambulatory Visit (INDEPENDENT_AMBULATORY_CARE_PROVIDER_SITE_OTHER): Payer: BC Managed Care – PPO | Admitting: Nurse Practitioner

## 2021-04-28 VITALS — BP 108/90 | HR 71 | Temp 97.8°F | Resp 16 | Ht 71.0 in | Wt 215.0 lb

## 2021-04-28 DIAGNOSIS — J449 Chronic obstructive pulmonary disease, unspecified: Secondary | ICD-10-CM

## 2021-04-28 DIAGNOSIS — E782 Mixed hyperlipidemia: Secondary | ICD-10-CM

## 2021-04-28 DIAGNOSIS — I10 Essential (primary) hypertension: Secondary | ICD-10-CM | POA: Diagnosis not present

## 2021-04-28 DIAGNOSIS — M159 Polyosteoarthritis, unspecified: Secondary | ICD-10-CM

## 2021-04-28 DIAGNOSIS — E1165 Type 2 diabetes mellitus with hyperglycemia: Secondary | ICD-10-CM | POA: Diagnosis not present

## 2021-04-28 LAB — POCT GLYCOSYLATED HEMOGLOBIN (HGB A1C): Hemoglobin A1C: 8.8 % — AB (ref 4.0–5.6)

## 2021-04-28 MED ORDER — METFORMIN HCL 1000 MG PO TABS: 1000.0000 mg | ORAL_TABLET | Freq: Two times a day (BID) | ORAL | 3 refills | Status: AC

## 2021-04-28 MED ORDER — CYCLOBENZAPRINE HCL 10 MG PO TABS: 10.0000 mg | ORAL_TABLET | Freq: Three times a day (TID) | ORAL | 0 refills | Status: AC | PRN

## 2021-04-28 MED ORDER — INSULIN GLARGINE 100 UNIT/ML SOLOSTAR PEN: 35.0000 [IU] | PEN_INJECTOR | Freq: Every day | SUBCUTANEOUS | 8 refills | Status: DC

## 2021-04-28 MED ORDER — ATORVASTATIN CALCIUM 40 MG PO TABS: 40.0000 mg | ORAL_TABLET | Freq: Every day | ORAL | 3 refills | Status: AC

## 2021-04-28 MED ORDER — TIRZEPATIDE 7.5 MG/0.5ML ~~LOC~~ SOAJ: 7.5000 mg | SUBCUTANEOUS | 3 refills | Status: DC

## 2021-04-28 NOTE — Progress Notes (Signed)
[facility] ?[address] ?[place], [place] [address] ? ?Internal MEDICINE  ?Office Visit Note ? ?Patient Name: Ricardo Durham ? [id number]  ?[id number] ? ?Date of Service: 04/28/2021 ? ?Chief Complaint  ?Patient presents with  ? Follow-up  ? Diabetes  ? Hyperlipidemia  ? Hypertension  ? Shoulder Pain  ?  Right shoulder pain  ? ? ?HPI ?Ricardo Durham presents for a follow up visit for diabetes, hypertension, hyperlipidemia and right shoulder pain most likely related to osteoarthritis. His A1C is 8.8 today which is improved from 9.2 in September last year. 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 Prescription can now be sent electronically to the [facility] pharmacies. Prescriptions will be updated and sent their as they are needing to be reordered.  ?He is requesting cyclobenzaprine for his right shoulder pain.  ? ? ?Current Medication: ?Outpatient Encounter Medications as of 04/28/2021  ?Medication Sig  ? albuterol (PROVENTIL) (2.5 MG/3ML) 0.083% nebulizer solution Take 3 mLs (2.5 mg total) by nebulization every 6 (six) hours as needed for wheezing or shortness of breath.  ? aspirin 81 MG tablet Take 81 mg by mouth every other day.  ? benzonatate (TESSALON) 200 MG capsule Take 1 capsule (200 mg total) by mouth 3 (three) times daily as needed for cough.  ? cetirizine (ZYRTEC) 10 MG tablet Take 10 mg by mouth daily.  ? Continuous Blood Gluc Sensor (FREESTYLE LIBRE 2 SENSOR) MISC Use as directed every 14  days DX E11.65  ? cyclobenzaprine (FLEXERIL) 10 MG tablet Take 1 tablet (10 mg total) by mouth 3 (three) times daily as needed for muscle spasms.  ? DULoxetine (CYMBALTA) 30 MG capsule TAKE ONE CAPSULE BY MOUTH ONCE EVERY DAY  ? fluticasone (VERAMYST) 27.5 MCG/SPRAY nasal spray Place 2 sprays into the nose daily.  ? Fluticasone-Salmeterol (ADVAIR) 100-50 MCG/DOSE AEPB Inhale 1 puff into the lungs 2 (two) times daily.  ? glucose blood  (ACCU-CHEK GUIDE) test strip Use as instructed  ? insulin glargine (LANTUS) 100 UNIT/ML Solostar Pen Inject 35 Units into the skin daily.  ? Insulin Pen Needle 32G X 4 MM MISC Use as directed, once daily.  ? ipratropium (ATROVENT) 0.06 % nasal spray Place 2 sprays into both nostrils 4 (four) times daily.  ? lisinopril (ZESTRIL) 10 MG tablet Take 10 mg by mouth daily.  ? ondansetron (ZOFRAN) 4 MG tablet Take 1 tablet (4 mg total) by mouth every 8 (eight) hours as needed for nausea or vomiting.  ? pantoprazole (PROTONIX) 40 MG tablet TAKE 1 TABLET BY MOUTH TWICE A DAY  ? tirzepatide (MOUNJARO) 7.5 MG/0.5ML Pen Inject 7.5 mg into the skin once a week.  ? [DISCONTINUED] atorvastatin (LIPITOR) 40 MG tablet Take 1 tablet (40 mg total) by mouth daily.  ? [DISCONTINUED] azithromycin (ZITHROMAX) 250 MG tablet Take as directed for 5 days  ? [DISCONTINUED] insulin glargine (LANTUS) 100 UNIT/ML injection Inject 0.25 mLs (25 Units total) into the skin daily. Pt goes thru [place] to get this medication  ? [DISCONTINUED] metFORMIN (GLUCOPHAGE) 1000 MG tablet TAKE 1 TABLET (1,000 MG TOTAL) BY MOUTH TWICE A DAY WITH FOOD  ? [DISCONTINUED] tirzepatide [facility]) 5 MG/0.5ML Pen Inject 5 mg into the skin once a week.  ? atorvastatin (LIPITOR) 40 MG tablet Take 1 tablet (40 mg total) by mouth daily.  ? metFORMIN (GLUCOPHAGE) 1000 MG tablet Take 1 tablet (1,000 mg  total) by mouth 2 (two) times daily with a meal.  ? ?No facility-administered encounter medications on file as of 04/28/2021.  ? ? ?Surgical History: ?Past Surgical History:  ?Procedure Laterality Date  ? CARDIAC CATHETERIZATION Left 08/24/2015  ? Procedure: Left Heart Cath and Coronary Angiography;  Surgeon: Corey Skains, MD;  Location: [place] [facility];  Service: Cardiovascular;  Laterality: Left;  ? COLONOSCOPY WITH PROPOFOL N/A 10/26/2014  ? Procedure: COLONOSCOPY WITH PROPOFOL;  Surgeon: Lollie Sails, MD;  Location: [facility] ENDOSCOPY;  Service: Endoscopy;  Laterality: N/A;   ? CYST REMOVAL TRUNK N/A   ? ESOPHAGOGASTRODUODENOSCOPY (EGD) WITH PROPOFOL N/A 10/26/2014  ? Procedure: ESOPHAGOGASTRODUODENOSCOPY (EGD) WITH PROPOFOL;  Surgeon: Lollie Sails, MD;  Location: [facility] ENDOSCOPY;  Service: Endoscopy;  Laterality: N/A;  ? NECK SURGERY    ? c 6-7 due to stable fracture  ? NISSEN FUNDOPLICATION    ? UMBILICAL HERNIA REPAIR  2001  ? ? ?Medical History: ?Past Medical History:  ?Diagnosis Date  ? COPD (chronic obstructive pulmonary disease) ([place])   ? Diabetes mellitus without complication ([place])   ? Enlarged prostate   ? Hyperlipemia   ? Hypertension   ? ? ?Family History: ?Family History  ?Problem Relation Age of Onset  ? Diabetes Father   ? Alzheimer's disease Mother   ? Hypertension Mother   ? Hyperlipidemia Mother   ? ? ?Social History  ? ?Socioeconomic History  ? Marital status: Divorced  ?  Spouse name: Not on file  ? Number of children: Not on file  ? Years of education: Not on file  ? Highest education level: Not on file  ?Occupational History  ? Not on file  ?Tobacco Use  ? Smoking status: Former  ?  Types: Cigarettes  ?  Quit date: 01/22/1980  ?  Years since quitting: 41.2  ? Smokeless tobacco: Never  ?Vaping Use  ? Vaping Use: Never used  ?Substance and Sexual Activity  ? Alcohol use: Not Currently  ?  Alcohol/week: 1.0 standard drink  ?  Types: 1 Cans of beer per week  ?  Comment: occasionally  ? Drug use: No  ? Sexual activity: Not on file  ?Other Topics Concern  ? Not on file  ?Social History Narrative  ? Not on file  ? ?Social Determinants of Health  ? ?Financial Resource Strain: Not on file  ?Food Insecurity: Not on file  ?Transportation Needs: Not on file  ?Physical Activity: Not on file  ?Stress: Not on file  ?Social Connections: Not on file  ?Intimate Partner Violence: Not on file  ? ? ? ? ?Review of Systems  ?Constitutional:  Negative for chills, fatigue and unexpected weight change.  ?HENT:  Negative for congestion, rhinorrhea, sneezing and sore throat.   ?Eyes:   Negative for redness.  ?Respiratory:  Negative for cough, chest tightness and shortness of breath.   ?Cardiovascular:  Negative for chest pain and palpitations.  ?Gastrointestinal:  Negative for abdominal pain, constipation, diarrhea, nausea and vomiting.  ?Genitourinary:  Negative for dysuria and frequency.  ?Musculoskeletal:  Positive for arthralgias. Negative for back pain, joint swelling and neck pain.  ?Skin:  Negative for rash.  ?Neurological: Negative.  Negative for tremors and numbness.  ?Hematological:  Negative for adenopathy. Does not bruise/bleed easily.  ?Psychiatric/Behavioral:  Negative for behavioral problems (Depression), sleep disturbance and suicidal ideas. The patient is not nervous/anxious.   ? ?Vital Signs: ?BP 108/90   Pulse 71   Temp 97.8 ?F (36.6 ?C)  Resp 16   Wt 215 lb (97.5 kg)   SpO2 97%   BMI 29.99 kg/m?  ? ? ?Physical Exam ?Vitals reviewed.  ?Constitutional:   ?   General: He is not in acute distress. ?   Appearance: Normal appearance. He is obese. He is not ill-appearing.  ?HENT:  ?   Head: Normocephalic and atraumatic.  ?Eyes:  ?   Pupils: Pupils are equal, round, and reactive to light.  ?Cardiovascular:  ?   Rate and Rhythm: Normal rate and regular rhythm.  ?Pulmonary:  ?   Effort: Pulmonary effort is normal. No respiratory distress.  ?Neurological:  ?   Mental Status: He is alert and oriented to person, place, and time.  ?Psychiatric:     ?   Mood and Affect: Mood normal.     ?   Behavior: Behavior normal.  ? ? ? ? ? ?Assessment/Plan: ?1. Uncontrolled type 2 diabetes mellitus with hyperglycemia ([place]) ?A1C continues to improve, mounjaro dose increased. Will work on decreasing lantus and metformin as his glucose levels improve. Follow up in 3 months for repeat A1C.  ?- POCT HgB A1C ?- tirzepatide (MOUNJARO) 7.5 MG/0.5ML Pen; Inject 7.5 mg into the skin once a week.  Dispense: 6 mL; Refill: 3 ?- metFORMIN (GLUCOPHAGE) 1000 MG tablet; Take 1 tablet (1,000 mg total) by mouth 2  (two) times daily with a meal.  Dispense: 180 tablet; Refill: 3 ?- insulin glargine (LANTUS) 100 UNIT/ML Solostar Pen; Inject 35 Units into the skin daily.  Dispense: 15 mL; Refill: 8 ? ?2. Essential hypertensio

## 2021-04-29 ENCOUNTER — Encounter: Payer: Self-pay | Admitting: Nurse Practitioner

## 2021-05-10 ENCOUNTER — Other Ambulatory Visit: Payer: Self-pay | Admitting: Nurse Practitioner

## 2021-05-10 DIAGNOSIS — E1165 Type 2 diabetes mellitus with hyperglycemia: Secondary | ICD-10-CM

## 2021-05-17 DIAGNOSIS — E119 Type 2 diabetes mellitus without complications: Secondary | ICD-10-CM | POA: Diagnosis not present

## 2021-05-17 DIAGNOSIS — M79672 Pain in left foot: Secondary | ICD-10-CM | POA: Diagnosis not present

## 2021-05-17 DIAGNOSIS — M2141 Flat foot [pes planus] (acquired), right foot: Secondary | ICD-10-CM | POA: Diagnosis not present

## 2021-05-17 DIAGNOSIS — M25511 Pain in right shoulder: Secondary | ICD-10-CM | POA: Diagnosis not present

## 2021-05-17 DIAGNOSIS — M2142 Flat foot [pes planus] (acquired), left foot: Secondary | ICD-10-CM | POA: Diagnosis not present

## 2021-06-17 DIAGNOSIS — M25511 Pain in right shoulder: Secondary | ICD-10-CM | POA: Diagnosis not present

## 2021-06-17 DIAGNOSIS — W19XXXA Unspecified fall, initial encounter: Secondary | ICD-10-CM | POA: Diagnosis not present

## 2021-06-22 DIAGNOSIS — M720 Palmar fascial fibromatosis [Dupuytren]: Secondary | ICD-10-CM | POA: Diagnosis not present

## 2021-07-28 ENCOUNTER — Encounter: Payer: Self-pay | Admitting: Nurse Practitioner

## 2021-07-28 ENCOUNTER — Ambulatory Visit: Payer: BC Managed Care – PPO | Admitting: Nurse Practitioner

## 2021-07-28 VITALS — BP 125/79 | HR 85 | Temp 98.8°F | Resp 16 | Ht 71.0 in | Wt 212.2 lb

## 2021-07-28 DIAGNOSIS — E1165 Type 2 diabetes mellitus with hyperglycemia: Secondary | ICD-10-CM | POA: Diagnosis not present

## 2021-07-28 DIAGNOSIS — I10 Essential (primary) hypertension: Secondary | ICD-10-CM

## 2021-07-28 DIAGNOSIS — J449 Chronic obstructive pulmonary disease, unspecified: Secondary | ICD-10-CM

## 2021-07-28 LAB — POCT GLYCOSYLATED HEMOGLOBIN (HGB A1C): Hemoglobin A1C: 7.5 % — AB (ref 4.0–5.6)

## 2021-07-28 NOTE — Progress Notes (Signed)
[facility], [place] [address]  Internal MEDICINE  Office Visit Note  Patient Name: Ricardo Durham  [id number]  [id number]  Date of Service: 09/11/2021  Chief Complaint  Patient presents with   Follow-up    Pt fell out of truck about a year ago, right shoulder pain and right hip pain, pt states he will go to [place] for those things    Diabetes   Hyperlipidemia   Hypertension   COPD    HPI Ricardo Durham presents for follow-up visit for hypertension, diabetes and COPD.  His A1c is 7.5 today which is significantly improved from 8.8 at his previous office visit.  He has lost 3 pounds since his previous office visit as well.  He is currently on Ozempic 2 mg weekly.  He was initially prescribed Mounjaro but the [place] pharmacy did not have this on their formulary so the [place] provider switched the patient to [place] without consulting his PCP.  Regardless, Ozempic has been beneficial to his glucose levels.    Current Medication: Outpatient Encounter Medications as of 07/28/2021  Medication Sig   albuterol (PROVENTIL) (2.5 MG/3ML) 0.083% nebulizer solution Take 3 mLs (2.5 mg total) by nebulization every 6 (six) hours as needed for wheezing or shortness of breath.   aspirin 81 MG tablet Take 81 mg by mouth every other day.   atorvastatin (LIPITOR) 40 MG tablet Take 1 tablet (40 mg total) by mouth daily.   benzonatate (TESSALON) 200 MG capsule Take 1 capsule (200 mg total) by mouth 3 (three) times daily as needed for cough.   cetirizine (ZYRTEC) 10 MG tablet Take 10 mg by mouth daily.   Continuous Blood Gluc Sensor (FREESTYLE LIBRE 2 SENSOR) MISC Use as directed every 14  days DX E11.65   cyclobenzaprine (FLEXERIL) 10 MG tablet Take 1 tablet (10 mg total) by mouth 3 (three) times daily as needed for muscle spasms.   DULoxetine (CYMBALTA) 30 MG capsule TAKE ONE CAPSULE BY MOUTH ONCE EVERY DAY   fluticasone (VERAMYST) 27.5 MCG/SPRAY nasal spray Place 2 sprays into the nose  daily.   Fluticasone-Salmeterol (ADVAIR) 100-50 MCG/DOSE AEPB Inhale 1 puff into the lungs 2 (two) times daily.   glucose blood (ACCU-CHEK GUIDE) test strip Use as instructed   insulin glargine (LANTUS) 100 UNIT/ML Solostar Pen Inject 35 Units into the skin daily.   Insulin Pen Needle 32G X 4 MM MISC Use as directed, once daily.   ipratropium (ATROVENT) 0.06 % nasal spray Place 2 sprays into both nostrils 4 (four) times daily.   lisinopril (ZESTRIL) 10 MG tablet Take 10 mg by mouth daily.   metFORMIN (GLUCOPHAGE) 1000 MG tablet Take 1 tablet (1,000 mg total) by mouth 2 (two) times daily with a meal.   ondansetron (ZOFRAN) 4 MG tablet Take 1 tablet (4 mg total) by mouth every 8 (eight) hours as needed for nausea or vomiting.   pantoprazole (PROTONIX) 40 MG tablet TAKE 1 TABLET BY MOUTH TWICE A DAY   tirzepatide (MOUNJARO) 7.5 MG/0.5ML Pen Inject 7.5 mg into the skin once a week.   JARDIANCE 10 MG TABS tablet Take 10 mg by mouth daily.   No facility-administered encounter medications on file as of 07/28/2021.    Surgical History: Past Surgical History:  Procedure Laterality Date   CARDIAC CATHETERIZATION Left 08/24/2015   Procedure: Left Heart Cath and Coronary Angiography;  Surgeon: Corey Skains, MD;  Location: [place] CV LAB;  Service: Cardiovascular;  Laterality: Left;  COLONOSCOPY WITH PROPOFOL N/A 10/26/2014   Procedure: COLONOSCOPY WITH PROPOFOL;  Surgeon: Lollie Sails, MD;  Location: [facility] ENDOSCOPY;  Service: Endoscopy;  Laterality: N/A;   CYST REMOVAL TRUNK N/A    ESOPHAGOGASTRODUODENOSCOPY (EGD) WITH PROPOFOL N/A 10/26/2014   Procedure: ESOPHAGOGASTRODUODENOSCOPY (EGD) WITH PROPOFOL;  Surgeon: Lollie Sails, MD;  Location: [facility] ENDOSCOPY;  Service: Endoscopy;  Laterality: N/A;   NECK SURGERY     c 6-7 due to stable fracture   NISSEN FUNDOPLICATION     UMBILICAL HERNIA REPAIR  2001    Medical History: Past Medical History:  Diagnosis Date   COPD (chronic  obstructive pulmonary disease) ([place])    Diabetes mellitus without complication ([place])    Enlarged prostate    Hyperlipemia    Hypertension     Family History: Family History  Problem Relation Age of Onset   Diabetes Father    Alzheimer's disease Mother    Hypertension Mother    Hyperlipidemia Mother     Social History   Socioeconomic History   Marital status: Divorced    Spouse name: Not on file   Number of children: Not on file   Years of education: Not on file   Highest education level: Not on file  Occupational History   Not on file  Tobacco Use   Smoking status: Former    Types: Cigarettes    Quit date: 01/22/1980    Years since quitting: 41.6   Smokeless tobacco: Never  Vaping Use   Vaping Use: Never used  Substance and Sexual Activity   Alcohol use: Not Currently    Alcohol/week: 1.0 standard drink of alcohol    Types: 1 Cans of beer per week    Comment: occasionally   Drug use: No   Sexual activity: Not on file  Other Topics Concern   Not on file  Social History Narrative   Not on file   Social Determinants of Health   Financial Resource Strain: Not on file  Food Insecurity: Not on file  Transportation Needs: Not on file  Physical Activity: Not on file  Stress: Not on file  Social Connections: Not on file  Intimate Partner Violence: Not on file      Review of Systems  Constitutional:  Negative for chills, fatigue and unexpected weight change.  HENT:  Negative for congestion, rhinorrhea, sneezing and sore throat.   Eyes:  Negative for redness.  Respiratory:  Negative for cough, chest tightness and shortness of breath.   Cardiovascular:  Negative for chest pain and palpitations.  Gastrointestinal:  Negative for abdominal pain, constipation, diarrhea, nausea and vomiting.  Genitourinary:  Negative for dysuria and frequency.  Musculoskeletal:  Positive for arthralgias. Negative for back pain, joint swelling and neck pain.  Skin:  Negative for  rash.  Neurological: Negative.  Negative for tremors and numbness.  Hematological:  Negative for adenopathy. Does not bruise/bleed easily.  Psychiatric/Behavioral:  Negative for behavioral problems (Depression), sleep disturbance and suicidal ideas. The patient is not nervous/anxious.     Vital Signs: BP 125/79   Pulse 85   Temp 98.8 F (37.1 C)   Resp 16   Ht '5\' 11"'$  (1.803 m)   Wt 212 lb 3.2 oz (96.3 kg)   SpO2 97%   BMI 29.60 kg/m    Physical Exam Vitals reviewed.  Constitutional:      General: He is not in acute distress.    Appearance: Normal appearance. He is obese. He is not ill-appearing.  HENT:  Head: Normocephalic and atraumatic.  Eyes:     Pupils: Pupils are equal, round, and reactive to light.  Cardiovascular:     Rate and Rhythm: Normal rate and regular rhythm.     Heart sounds: Normal heart sounds. No murmur heard. Pulmonary:     Effort: Pulmonary effort is normal. No respiratory distress.     Breath sounds: Normal breath sounds. No wheezing.  Neurological:     Mental Status: He is alert and oriented to person, place, and time.  Psychiatric:        Mood and Affect: Mood normal.        Behavior: Behavior normal.        Assessment/Plan: 1. Uncontrolled type 2 diabetes mellitus with hyperglycemia (HCC) A1c continues to improve, unable to get mounjaro from the [place] so the patient is on Ozempic and he is doing well follow up in 3 months for repeat A1c  - POCT HgB A1C  2. Essential hypertension blood pressure is stable and well-controlled with current medications  3. Chronic obstructive pulmonary disease, unspecified COPD type ([place]) Stable, continue Advair as prescribed, continue nebulizer treatments as needed.  Denies any shortness of breath, cough, chest tightness, or wheezing today.   General Counseling: saxon barich understanding of the findings of todays visit and agrees with plan of treatment. I have discussed any further diagnostic  evaluation that may be needed or ordered today. We also reviewed his medications today. he has been encouraged to call the office with any questions or concerns that should arise related to todays visit.    Orders Placed This Encounter  Procedures   POCT HgB A1C    No orders of the defined types were placed in this encounter.   Return in about 3 months (around 10/28/2021) for F/U, Recheck A1C, Chayden Garrelts PCP.   Total time spent:30 Minutes Time spent includes review of chart, medications, test results, and follow up plan with the patient.   [place] Controlled Substance Database was reviewed by me.  This patient was seen by Jonetta Osgood, FNP-C in collaboration with Dr. Clayborn Bigness as a part of collaborative care agreement.   Audia Amick R. Valetta Fuller, MSN, FNP-C Internal medicine

## 2021-09-08 DIAGNOSIS — I2510 Atherosclerotic heart disease of native coronary artery without angina pectoris: Secondary | ICD-10-CM | POA: Diagnosis not present

## 2021-09-08 DIAGNOSIS — J449 Chronic obstructive pulmonary disease, unspecified: Secondary | ICD-10-CM | POA: Diagnosis not present

## 2021-09-08 DIAGNOSIS — I10 Essential (primary) hypertension: Secondary | ICD-10-CM | POA: Diagnosis not present

## 2021-09-08 DIAGNOSIS — E119 Type 2 diabetes mellitus without complications: Secondary | ICD-10-CM | POA: Diagnosis not present

## 2021-09-08 DIAGNOSIS — K4091 Unilateral inguinal hernia, without obstruction or gangrene, recurrent: Secondary | ICD-10-CM | POA: Diagnosis not present

## 2021-09-11 ENCOUNTER — Encounter: Payer: Self-pay | Admitting: Nurse Practitioner

## 2021-09-16 DIAGNOSIS — K4090 Unilateral inguinal hernia, without obstruction or gangrene, not specified as recurrent: Secondary | ICD-10-CM | POA: Diagnosis not present

## 2021-09-22 DIAGNOSIS — E119 Type 2 diabetes mellitus without complications: Secondary | ICD-10-CM | POA: Diagnosis not present

## 2021-09-22 DIAGNOSIS — H527 Unspecified disorder of refraction: Secondary | ICD-10-CM | POA: Diagnosis not present

## 2021-09-28 ENCOUNTER — Encounter: Payer: BC Managed Care – PPO | Admitting: Nurse Practitioner

## 2021-10-08 DIAGNOSIS — K4090 Unilateral inguinal hernia, without obstruction or gangrene, not specified as recurrent: Secondary | ICD-10-CM | POA: Diagnosis not present

## 2021-10-10 DIAGNOSIS — E119 Type 2 diabetes mellitus without complications: Secondary | ICD-10-CM | POA: Diagnosis not present

## 2021-10-10 DIAGNOSIS — M25511 Pain in right shoulder: Secondary | ICD-10-CM | POA: Diagnosis not present

## 2021-10-10 DIAGNOSIS — G8929 Other chronic pain: Secondary | ICD-10-CM | POA: Diagnosis not present

## 2021-10-18 ENCOUNTER — Encounter: Payer: Self-pay | Admitting: Nurse Practitioner

## 2021-10-20 ENCOUNTER — Encounter: Payer: Self-pay | Admitting: Nurse Practitioner

## 2021-10-20 ENCOUNTER — Ambulatory Visit (INDEPENDENT_AMBULATORY_CARE_PROVIDER_SITE_OTHER): Payer: BC Managed Care – PPO | Admitting: Nurse Practitioner

## 2021-10-20 VITALS — BP 140/85 | HR 79 | Temp 97.9°F | Resp 16 | Ht 71.0 in | Wt 220.0 lb

## 2021-10-20 DIAGNOSIS — E1165 Type 2 diabetes mellitus with hyperglycemia: Secondary | ICD-10-CM | POA: Diagnosis not present

## 2021-10-20 DIAGNOSIS — Z0001 Encounter for general adult medical examination with abnormal findings: Secondary | ICD-10-CM | POA: Diagnosis not present

## 2021-10-20 DIAGNOSIS — R300 Dysuria: Secondary | ICD-10-CM

## 2021-10-20 DIAGNOSIS — Z125 Encounter for screening for malignant neoplasm of prostate: Secondary | ICD-10-CM | POA: Diagnosis not present

## 2021-10-20 LAB — LIPID PANEL
Cholesterol, Total: 179 (ref 100–200)
HDL Cholesterol: 46 (ref 39–?)
LDL Cholesterol: 116 — AB (ref 0–99)
Total CHOL/HDL Ratio: 3.9
Triglycerides: 132 (ref 0–149)
VLDL: 26 mg/dL (ref 5–40)

## 2021-10-20 LAB — BASIC METABOLIC PANEL WITH GFR
Anion Gap: 5 (ref ?–30)
BUN: 18 (ref 4–21)
CO2: 28 — AB (ref 13–22)
Calcium: 9 (ref 8.7–10.7)
Chloride: 107 (ref 99–108)
Creatinine: 1.3 (ref 0.6–1.3)
EGFR: 61
Glucose: 99 (ref 70–99)
Potassium: 4.2 mEq/L (ref 3.5–5.1)
Sodium: 140 (ref 137–147)

## 2021-10-20 LAB — HEMOGLOBIN A1C: Hemoglobin A1C: 7.4

## 2021-10-20 NOTE — Progress Notes (Signed)
[facility] [place], [place] [address]  Internal MEDICINE  Office Visit Note  Patient Name: Ricardo Durham  [id number]  [id number]  Date of Service: 10/20/2021  Chief Complaint  Patient presents with   Annual Exam   Diabetes   Hyperlipidemia   Hypertension    HPI Heraclio presents for an annual well visit and physical exam. Well-appearing 66 year old male with hypertension, CAD, GERD, diabetes, osteoarthritis, and hyperlipidemia. --Due for routine colonoscopy in 2026, wants to have this done at [facility] --due for PSA, all other labs were done at the [place].  --recent records and visits at the [place] were reviewed today -- seen by [facility] PCP, ophthalmologist, and podiatrist.  --gained approx 8 lbs since july --BP is stable with current medications --A1C was checked in August by provider at the [facility], slightly improved at 7.4 Overall, patient doing well, no new or worsening pains, no other questions or concerns. No significant changes in diet or lifestyle.     Current Medication: Outpatient Encounter Medications as of 10/20/2021  Medication Sig   albuterol (PROVENTIL) (2.5 MG/3ML) 0.083% nebulizer solution Take 3 mLs (2.5 mg total) by nebulization every 6 (six) hours as needed for wheezing or shortness of breath.   aspirin 81 MG tablet Take 81 mg by mouth every other day.   atorvastatin (LIPITOR) 40 MG tablet Take 1 tablet (40 mg total) by mouth daily.   benzonatate (TESSALON) 200 MG capsule Take 1 capsule (200 mg total) by mouth 3 (three) times daily as needed for cough.   cetirizine (ZYRTEC) 10 MG tablet Take 10 mg by mouth daily.   Continuous Blood Gluc Sensor (FREESTYLE LIBRE 2 SENSOR) MISC Use as directed every 14  days DX E11.65   cyclobenzaprine (FLEXERIL) 10 MG tablet Take 1 tablet (10 mg total) by mouth 3 (three) times daily as needed for muscle spasms.   DULoxetine (CYMBALTA) 30 MG capsule TAKE ONE CAPSULE BY MOUTH ONCE EVERY DAY   fluticasone (VERAMYST) 27.5  MCG/SPRAY nasal spray Place 2 sprays into the nose daily.   Fluticasone-Salmeterol (ADVAIR) 100-50 MCG/DOSE AEPB Inhale 1 puff into the lungs 2 (two) times daily.   glucose blood (ACCU-CHEK GUIDE) test strip Use as instructed   insulin glargine (LANTUS) 100 UNIT/ML Solostar Pen Inject 35 Units into the skin daily.   Insulin Pen Needle 32G X 4 MM MISC Use as directed, once daily.   ipratropium (ATROVENT) 0.06 % nasal spray Place 2 sprays into both nostrils 4 (four) times daily.   JARDIANCE 10 MG TABS tablet Take 10 mg by mouth daily.   lisinopril (ZESTRIL) 10 MG tablet Take 10 mg by mouth daily.   metFORMIN (GLUCOPHAGE) 1000 MG tablet Take 1 tablet (1,000 mg total) by mouth 2 (two) times daily with a meal.   ondansetron (ZOFRAN) 4 MG tablet Take 1 tablet (4 mg total) by mouth every 8 (eight) hours as needed for nausea or vomiting.   pantoprazole (PROTONIX) 40 MG tablet TAKE 1 TABLET BY MOUTH TWICE A DAY   No facility-administered encounter medications on file as of 10/20/2021.    Surgical History: Past Surgical History:  Procedure Laterality Date   CARDIAC CATHETERIZATION Left 08/24/2015   Procedure: Left Heart Cath and Coronary Angiography;  Surgeon: Corey Skains, MD;  Location: [place] CV LAB;  Service: Cardiovascular;  Laterality: Left;   COLONOSCOPY WITH PROPOFOL N/A 10/26/2014   Procedure: COLONOSCOPY WITH PROPOFOL;  Surgeon: Lollie Sails, MD;  Location[facility] ENDOSCOPY;  Service: Endoscopy;  Laterality: N/A;   CYST REMOVAL TRUNK N/A    ESOPHAGOGASTRODUODENOSCOPY (EGD) WITH PROPOFOL N/A 10/26/2014   Procedure: ESOPHAGOGASTRODUODENOSCOPY (EGD) WITH PROPOFOL;  Surgeon: Lollie Sails, MD;  Location: [facility] ENDOSCOPY;  Service: Endoscopy;  Laterality: N/A;   NECK SURGERY     c 6-7 due to stable fracture   NISSEN FUNDOPLICATION     UMBILICAL HERNIA REPAIR  2001    Medical History: Past Medical History:  Diagnosis Date   COPD (chronic obstructive pulmonary disease) ([place])     Diabetes mellitus without complication ([place])    Enlarged prostate    Hyperlipemia    Hypertension     Family History: Family History  Problem Relation Age of Onset   Diabetes Father    Alzheimer's disease Mother    Hypertension Mother    Hyperlipidemia Mother     Social History   Socioeconomic History   Marital status: Divorced    Spouse name: Not on file   Number of children: Not on file   Years of education: Not on file   Highest education level: Not on file  Occupational History   Not on file  Tobacco Use   Smoking status: Former    Types: Cigarettes    Quit date: 01/22/1980    Years since quitting: 41.7   Smokeless tobacco: Never  Vaping Use   Vaping Use: Never used  Substance and Sexual Activity   Alcohol use: Not Currently    Alcohol/week: 1.0 standard drink of alcohol    Types: 1 Cans of beer per week    Comment: occasionally   Drug use: No   Sexual activity: Not on file  Other Topics Concern   Not on file  Social History Narrative   Not on file   Social Determinants of Health   Financial Resource Strain: Not on file  Food Insecurity: Not on file  Transportation Needs: Not on file  Physical Activity: Not on file  Stress: Not on file  Social Connections: Not on file  Intimate Partner Violence: Not on file      Review of Systems  Constitutional:  Negative for activity change, appetite change, chills, fatigue, fever and unexpected weight change.  HENT: Negative.  Negative for congestion, ear pain, rhinorrhea, sore throat and trouble swallowing.   Eyes: Negative.   Respiratory: Negative.  Negative for cough, chest tightness, shortness of breath and wheezing.   Cardiovascular: Negative.  Negative for chest pain and palpitations.  Gastrointestinal: Negative.  Negative for abdominal pain, blood in stool, constipation, diarrhea, nausea and vomiting.  Endocrine: Negative.   Genitourinary: Negative.  Negative for difficulty urinating, dysuria,  frequency, hematuria and urgency.  Musculoskeletal: Negative.  Negative for arthralgias, back pain, joint swelling, myalgias and neck pain.  Skin: Negative.  Negative for rash and wound.  Allergic/Immunologic: Negative.  Negative for immunocompromised state.  Neurological: Negative.  Negative for dizziness, seizures, numbness and headaches.  Hematological: Negative.   Psychiatric/Behavioral: Negative.  Negative for behavioral problems, self-injury and suicidal ideas. The patient is not nervous/anxious.     Vital Signs: BP (!) 140/85   Pulse 79   Temp 97.9 F (36.6 C)   Resp 16   Ht '5\' 11"'$  (1.803 m)   Wt 220 lb (99.8 kg)   SpO2 95%   BMI 30.68 kg/m    Physical Exam Vitals reviewed.  Constitutional:      General: He is awake. He is not in acute distress.    Appearance: Normal appearance. He  is well-developed and well-groomed. He is obese. He is not ill-appearing or diaphoretic.  HENT:     Head: Normocephalic and atraumatic.     Right Ear: Tympanic membrane, ear canal and external ear normal.     Left Ear: Tympanic membrane, ear canal and external ear normal.     Nose: Nose normal. No congestion or rhinorrhea.     Mouth/Throat:     Lips: Pink.     Mouth: Mucous membranes are moist.     Pharynx: Oropharynx is clear. Uvula midline. No oropharyngeal exudate or posterior oropharyngeal erythema.  Eyes:     General: Lids are normal. Vision grossly intact. Gaze aligned appropriately. No scleral icterus.       Right eye: No discharge.        Left eye: No discharge.     Extraocular Movements: Extraocular movements intact.     Conjunctiva/sclera: Conjunctivae normal.     Pupils: Pupils are equal, round, and reactive to light.     Funduscopic exam:    Right eye: Red reflex present.        Left eye: Red reflex present. Neck:     Thyroid: No thyromegaly.     Vascular: No carotid bruit or JVD.     Trachea: No tracheal deviation.  Cardiovascular:     Rate and Rhythm: Normal rate and  regular rhythm.     Pulses: Normal pulses.     Heart sounds: Normal heart sounds. No murmur heard.    No friction rub. No gallop.  Pulmonary:     Effort: Pulmonary effort is normal. No respiratory distress.     Breath sounds: Normal breath sounds. No stridor. No wheezing or rales.  Chest:     Chest wall: No tenderness.  Abdominal:     General: Bowel sounds are normal. There is no distension.     Palpations: Abdomen is soft. There is no mass.     Tenderness: There is no abdominal tenderness. There is no guarding or rebound.     Hernia: Hernia: mounjaro.  Musculoskeletal:        General: No tenderness, deformity or signs of injury. Normal range of motion.     Cervical back: Normal range of motion and neck supple.  Lymphadenopathy:     Cervical: No cervical adenopathy.  Skin:    General: Skin is warm and dry.     Capillary Refill: Capillary refill takes less than 2 seconds.     Coloration: Skin is not pale.     Findings: No erythema or rash.  Neurological:     Mental Status: He is alert and oriented to person, place, and time.     Cranial Nerves: No cranial nerve deficit.     Motor: No abnormal muscle tone.     Coordination: Coordination normal.     Gait: Gait normal.     Deep Tendon Reflexes: Reflexes are normal and symmetric.  Psychiatric:        Mood and Affect: Mood normal.        Behavior: Behavior normal. Behavior is cooperative.        Thought Content: Thought content normal.        Judgment: Judgment normal.        Assessment/Plan: 1. Encounter for general adult medical examination with abnormal findings Age-appropriate preventive screenings and vaccinations discussed, annual physical exam completed. Routine labs for health maintenance done at the [place], and results manually entered into EPIC. PHM updated.   2. Uncontrolled type 2  diabetes mellitus with hyperglycemia ([place]) A1C checked at the [place], slightly improved at 7.4. continue current medications as prescribed, no  changes. Urine specimen obtained for urine microalbumin levels. - Urine Microalbumin w/creat. ratio  3. Dysuria Routine urinalysis done to screen for glucose, ketones, bacteria, blood, - UA/M w/rflx Culture, Routine  4. Screening for prostate cancer Lab ordered, CDC approved and recommended PSA level for prostate cancer screening  - PSA Total (Reflex To Free)      General Counseling: Corinna Gab understanding of the findings of todays visit and agrees with plan of treatment. I have discussed any further diagnostic evaluation that may be needed or ordered today. We also reviewed his medications today. he has been encouraged to call the office with any questions or concerns that should arise related to todays visit.    Orders Placed This Encounter  Procedures   UA/M w/rflx Culture, Routine   PSA Total (Reflex To Free)   Urine Microalbumin w/creat. ratio   Lipid panel   BASIC METABOLIC PANEL WITH GFR   Hemoglobin A1c    No orders of the defined types were placed in this encounter.   Return in about 3 months (around 01/19/2022) for F/U, Recheck A1C, Skylar Priest PCP.   Total time spent:30 Minutes Time spent includes review of chart, medications, test results, and follow up plan with the patient.   [place] Controlled Substance Database was reviewed by me.  This patient was seen by Jonetta Osgood, FNP-C in collaboration with Dr. Clayborn Bigness as a part of collaborative care agreement.  Eunie Lawn R. Valetta Fuller, MSN, FNP-C Internal medicine

## 2021-10-21 ENCOUNTER — Telehealth: Payer: Self-pay

## 2021-10-21 LAB — MICROSCOPIC EXAMINATION
Bacteria, UA: NONE SEEN
Casts: NONE SEEN /lpf
Epithelial Cells (non renal): NONE SEEN /hpf (ref 0–10)
RBC, Urine: NONE SEEN /hpf (ref 0–2)
WBC, UA: NONE SEEN /hpf (ref 0–5)

## 2021-10-21 LAB — UA/M W/RFLX CULTURE, ROUTINE
Bilirubin, UA: NEGATIVE
Glucose, UA: NEGATIVE
Ketones, UA: NEGATIVE
Leukocytes,UA: NEGATIVE
Nitrite, UA: NEGATIVE
Protein,UA: NEGATIVE
RBC, UA: NEGATIVE
Specific Gravity, UA: 1.025 (ref 1.005–1.030)
Urobilinogen, Ur: 0.2 mg/dL (ref 0.2–1.0)
pH, UA: 5.5 (ref 5.0–7.5)

## 2021-10-21 LAB — MICROALBUMIN / CREATININE URINE RATIO
Creatinine, Urine: 132.1 mg/dL
Microalb/Creat Ratio: 6 mg/g creat (ref 0–29)
Microalbumin, Urine: 7.3 ug/mL

## 2021-10-21 LAB — PSA TOTAL (REFLEX TO FREE): Prostate Specific Ag, Serum: 1.8 ng/mL (ref 0.0–4.0)

## 2021-10-21 NOTE — Telephone Encounter (Signed)
-----   Message from Jonetta Osgood, NP sent at 10/21/2021  8:13 AM EDT ----- Please let patient know that his PSA was normal. All the other labs were discussed with him yesterday

## 2021-10-21 NOTE — Progress Notes (Signed)
Please let patient know that his PSA was normal. All the other labs were discussed with him yesterday

## 2021-10-21 NOTE — Telephone Encounter (Signed)
Spoke with patient's sister 'Diane' regarding normal lab results.

## 2021-10-27 ENCOUNTER — Ambulatory Visit: Payer: BC Managed Care – PPO | Admitting: Nurse Practitioner

## 2021-11-15 ENCOUNTER — Ambulatory Visit
Admission: RE | Admit: 2021-11-15 | Discharge: 2021-11-15 | Disposition: A | Discharge status: Home / Self Care | Payer: BC Managed Care – PPO | Source: Ambulatory Visit | Attending: Nurse Practitioner | Admitting: Nurse Practitioner

## 2021-11-15 ENCOUNTER — Telehealth: Payer: Self-pay

## 2021-11-15 ENCOUNTER — Encounter: Payer: Self-pay | Admitting: Nurse Practitioner

## 2021-11-15 ENCOUNTER — Telehealth: Payer: BC Managed Care – PPO | Admitting: Nurse Practitioner

## 2021-11-15 ENCOUNTER — Ambulatory Visit
Admission: RE | Admit: 2021-11-15 | Discharge: 2021-11-15 | Disposition: A | Discharge status: Home / Self Care | Payer: BC Managed Care – PPO | Attending: Nurse Practitioner | Admitting: Nurse Practitioner

## 2021-11-15 VITALS — BP 132/73 | HR 87 | Temp 97.2°F | Resp 16 | Ht 71.0 in | Wt 215.0 lb

## 2021-11-15 DIAGNOSIS — R0602 Shortness of breath: Secondary | ICD-10-CM

## 2021-11-15 DIAGNOSIS — J069 Acute upper respiratory infection, unspecified: Secondary | ICD-10-CM

## 2021-11-15 DIAGNOSIS — R062 Wheezing: Secondary | ICD-10-CM | POA: Insufficient documentation

## 2021-11-15 DIAGNOSIS — J189 Pneumonia, unspecified organism: Secondary | ICD-10-CM | POA: Diagnosis not present

## 2021-11-15 MED ORDER — AMOXICILLIN-POT CLAVULANATE 875-125 MG PO TABS: 1.0000 | ORAL_TABLET | Freq: Two times a day (BID) | ORAL | 0 refills | Status: DC

## 2021-11-15 MED ORDER — ONDANSETRON HCL 4 MG PO TABS: 4.0000 mg | ORAL_TABLET | Freq: Three times a day (TID) | ORAL | 0 refills | Status: AC | PRN

## 2021-11-15 NOTE — Progress Notes (Signed)
[facility] [place], [place] [address]  Internal MEDICINE  Telephone Visit  Patient Name: Ricardo Durham  [id number]  [id number]  Date of Service: 11/15/2021  I connected with the patient at 1100 by telephone and verified the patients identity using two identifiers.   I discussed the limitations, risks, security and privacy concerns of performing an evaluation and management service by telephone and the availability of in person appointments. I also discussed with the patient that there may be a patient responsible charge related to the service.  The patient expressed understanding and agrees to proceed.    Chief Complaint  Patient presents with   Telephone Screen    Sore all over, cold, nnot eating nothing in the past 3 days.    Telephone Assessment    [phone]   Fever    HPI Ricardo Durham presents for a telehealth virtual visit for symptoms of persistent respiratory infection, likely pneumonia.  --body aches, fever, chills, cough, drainage, nausea, cold intolerance, fatigue, malaise --Symptoms became debilitating 3 days ago.    Current Medication: Outpatient Encounter Medications as of 11/15/2021  Medication Sig   albuterol (PROVENTIL) (2.5 MG/3ML) 0.083% nebulizer solution Take 3 mLs (2.5 mg total) by nebulization every 6 (six) hours as needed for wheezing or shortness of breath.   aspirin 81 MG tablet Take 81 mg by mouth every other day.   atorvastatin (LIPITOR) 40 MG tablet Take 1 tablet (40 mg total) by mouth daily.   cetirizine (ZYRTEC) 10 MG tablet Take 10 mg by mouth daily.   Continuous Blood Gluc Sensor (FREESTYLE LIBRE 2 SENSOR) MISC Use as directed every 14  days DX E11.65   cyclobenzaprine (FLEXERIL) 10 MG tablet Take 1 tablet (10 mg total) by mouth 3 (three) times daily as needed for muscle spasms.   DULoxetine (CYMBALTA) 30 MG capsule TAKE ONE CAPSULE BY MOUTH ONCE EVERY DAY   fluticasone (VERAMYST) 27.5 MCG/SPRAY nasal spray Place 2 sprays  into the nose daily.   Fluticasone-Salmeterol (ADVAIR) 100-50 MCG/DOSE AEPB Inhale 1 puff into the lungs 2 (two) times daily.   glucose blood (ACCU-CHEK GUIDE) test strip Use as instructed   insulin glargine (LANTUS) 100 UNIT/ML Solostar Pen Inject 35 Units into the skin daily.   Insulin Pen Needle 32G X 4 MM MISC Use as directed, once daily.   ipratropium (ATROVENT) 0.06 % nasal spray Place 2 sprays into both nostrils 4 (four) times daily.   JARDIANCE 10 MG TABS tablet Take 10 mg by mouth daily.   lisinopril (ZESTRIL) 10 MG tablet Take 10 mg by mouth daily.   metFORMIN (GLUCOPHAGE) 1000 MG tablet Take 1 tablet (1,000 mg total) by mouth 2 (two) times daily with a meal.   pantoprazole (PROTONIX) 40 MG tablet TAKE 1 TABLET BY MOUTH TWICE A DAY   [DISCONTINUED] amoxicillin-clavulanate (AUGMENTIN) 875-125 MG tablet Take 1 tablet by mouth 2 (two) times daily.   [DISCONTINUED] benzonatate (TESSALON) 200 MG capsule Take 1 capsule (200 mg total) by mouth 3 (three) times daily as needed for cough.   [DISCONTINUED] ondansetron (ZOFRAN) 4 MG tablet Take 1 tablet (4 mg total) by mouth every 8 (eight) hours as needed for nausea or vomiting.   ondansetron (ZOFRAN) 4 MG tablet Take 1 tablet (4 mg total) by mouth every 8 (eight) hours as needed for nausea or vomiting.   No facility-administered encounter medications on file as of 11/15/2021.    Surgical History: Past Surgical History:  Procedure Laterality Date   CARDIAC CATHETERIZATION  Left 08/24/2015   Procedure: Left Heart Cath and Coronary Angiography;  Surgeon: Corey Skains, MD;  Location: [place] CV LAB;  Service: Cardiovascular;  Laterality: Left;   COLONOSCOPY WITH PROPOFOL N/A 10/26/2014   Procedure: COLONOSCOPY WITH PROPOFOL;  Surgeon: Lollie Sails, MD;  Location: [facility] ENDOSCOPY;  Service: Endoscopy;  Laterality: N/A;   CYST REMOVAL TRUNK N/A    ESOPHAGOGASTRODUODENOSCOPY (EGD) WITH PROPOFOL N/A 10/26/2014   Procedure:  ESOPHAGOGASTRODUODENOSCOPY (EGD) WITH PROPOFOL;  Surgeon: Lollie Sails, MD;  Location[facility] ENDOSCOPY;  Service: Endoscopy;  Laterality: N/A;   NECK SURGERY     c 6-7 due to stable fracture   NISSEN FUNDOPLICATION     UMBILICAL HERNIA REPAIR  2001    Medical History: Past Medical History:  Diagnosis Date   COPD (chronic obstructive pulmonary disease) ([place])    Diabetes mellitus without complication ([place])    Enlarged prostate    Hyperlipemia    Hypertension     Family History: Family History  Problem Relation Age of Onset   Diabetes Father    Alzheimer's disease Mother    Hypertension Mother    Hyperlipidemia Mother     Social History   Socioeconomic History   Marital status: Divorced    Spouse name: Not on file   Number of children: Not on file   Years of education: Not on file   Highest education level: Not on file  Occupational History   Not on file  Tobacco Use   Smoking status: Former    Types: Cigarettes    Quit date: 01/22/1980    Years since quitting: 41.9   Smokeless tobacco: Never  Vaping Use   Vaping Use: Never used  Substance and Sexual Activity   Alcohol use: Not Currently    Alcohol/week: 1.0 standard drink of alcohol    Types: 1 Cans of beer per week    Comment: occasionally   Drug use: No   Sexual activity: Not on file  Other Topics Concern   Not on file  Social History Narrative   Not on file   Social Determinants of Health   Financial Resource Strain: Not on file  Food Insecurity: Not on file  Transportation Needs: Not on file  Physical Activity: Not on file  Stress: Not on file  Social Connections: Not on file  Intimate Partner Violence: Not on file      Review of Systems  Constitutional:  Positive for appetite change, chills, fatigue and fever.  HENT:  Positive for congestion, sinus pressure, sinus pain, sore throat and voice change.   Respiratory:  Positive for cough, chest tightness, shortness of breath and wheezing.    Cardiovascular: Negative.  Negative for chest pain and palpitations.  Gastrointestinal:  Positive for diarrhea and nausea. Negative for constipation and vomiting.  Endocrine: Positive for cold intolerance.  Musculoskeletal:  Positive for myalgias.  Neurological:  Positive for headaches.    Vital Signs: BP 132/73   Pulse 87   Temp (!) 97.2 F (36.2 C)   Resp 16   Ht '5\' 11"'$  (1.803 m)   Wt 215 lb (97.5 kg)   BMI 29.99 kg/m    Observation/Objective: He is alert and oriented and engages in conversation appropriately. He does not sound as though he is in any acute distress but his voice is hoarse and he is coughing a lot.    Assessment/Plan: 1. SOB (shortness of breath) Evaluate for severe lung infection - DG Chest 2 View; Future  2.  Wheezing Rule out COPD exacerbation and pneumoia - DG Chest 2 View; Future  3. Upper respiratory tract infection, unspecified type Possible upper and/or lower lung infection. Rule out pneumonia.  - DG Chest 2 View; Future   General Counseling: joshus rogan understanding of the findings of today's phone visit and agrees with plan of treatment. I have discussed any further diagnostic evaluation that may be needed or ordered today. We also reviewed his medications today. he has been encouraged to call the office with any questions or concerns that should arise related to todays visit.  No follow-ups on file.   Orders Placed This Encounter  Procedures   DG Chest 2 View    Meds ordered this encounter  Medications   DISCONTD: amoxicillin-clavulanate (AUGMENTIN) 875-125 MG tablet    Sig: Take 1 tablet by mouth 2 (two) times daily.    Dispense:  20 tablet    Refill:  0   ondansetron (ZOFRAN) 4 MG tablet    Sig: Take 1 tablet (4 mg total) by mouth every 8 (eight) hours as needed for nausea or vomiting.    Dispense:  20 tablet    Refill:  0    Time spent:10 Minutes Time spent with patient included reviewing progress notes, labs,  imaging studies, and discussing plan for follow up.  [place] Controlled Substance Database was reviewed by me for overdose risk score (ORS) if appropriate.  This patient was seen by Jonetta Osgood, FNP-C in collaboration with Dr. Clayborn Bigness as a part of collaborative care agreement.  Saige Busby R. Valetta Fuller, MSN, FNP-C Internal medicine

## 2021-11-15 NOTE — Telephone Encounter (Signed)
Pt advised  that chest xray showed pneumonia finished antibiotic need follow up in 2 weeks

## 2021-11-16 ENCOUNTER — Telehealth: Payer: Self-pay

## 2021-11-17 ENCOUNTER — Telehealth: Payer: Self-pay

## 2021-11-17 NOTE — Telephone Encounter (Signed)
Patient called to report ongoing stomach aches and diarrhea. Per Alyssa, advised patient to try Immodium and to call back if symptoms do not improve with this.

## 2021-11-18 MED ORDER — BENZONATATE 200 MG PO CAPS: 200.0000 mg | ORAL_CAPSULE | Freq: Two times a day (BID) | ORAL | 2 refills | Status: DC | PRN

## 2021-11-18 MED ORDER — PREDNISONE 10 MG PO TABS: ORAL_TABLET | ORAL | 0 refills | Status: DC

## 2021-11-18 NOTE — Telephone Encounter (Signed)
Prednisone and benzonatate prescribed

## 2021-11-27 ENCOUNTER — Encounter: Payer: Self-pay | Admitting: Nurse Practitioner

## 2021-11-27 DIAGNOSIS — J189 Pneumonia, unspecified organism: Secondary | ICD-10-CM

## 2021-11-28 ENCOUNTER — Ambulatory Visit: Payer: BC Managed Care – PPO | Admitting: Nurse Practitioner

## 2021-11-28 MED ORDER — LEVOFLOXACIN 750 MG PO TABS: 750.0000 mg | ORAL_TABLET | Freq: Every day | ORAL | 0 refills | Status: DC

## 2021-11-28 MED ORDER — PREDNISONE 10 MG PO TABS: ORAL_TABLET | ORAL | 0 refills | Status: DC

## 2021-11-29 ENCOUNTER — Ambulatory Visit: Payer: BC Managed Care – PPO | Admitting: Nurse Practitioner

## 2021-11-29 ENCOUNTER — Telehealth: Payer: Self-pay | Admitting: Nurse Practitioner

## 2021-11-29 ENCOUNTER — Ambulatory Visit
Admission: RE | Admit: 2021-11-29 | Discharge: 2021-11-29 | Disposition: A | Discharge status: Home / Self Care | Payer: BC Managed Care – PPO | Attending: Nurse Practitioner | Admitting: Nurse Practitioner

## 2021-11-29 ENCOUNTER — Ambulatory Visit
Admission: RE | Admit: 2021-11-29 | Discharge: 2021-11-29 | Disposition: A | Discharge status: Home / Self Care | Payer: BC Managed Care – PPO | Source: Ambulatory Visit | Attending: Nurse Practitioner | Admitting: Nurse Practitioner

## 2021-11-29 DIAGNOSIS — J189 Pneumonia, unspecified organism: Secondary | ICD-10-CM

## 2021-11-29 NOTE — Telephone Encounter (Signed)
Error

## 2021-11-29 NOTE — Telephone Encounter (Signed)
Tat already spoke with pt sister

## 2021-12-01 ENCOUNTER — Encounter: Payer: Self-pay | Admitting: Nurse Practitioner

## 2021-12-10 ENCOUNTER — Encounter: Payer: Self-pay | Admitting: Nurse Practitioner

## 2021-12-12 ENCOUNTER — Ambulatory Visit: Payer: BC Managed Care – PPO | Admitting: Nurse Practitioner

## 2021-12-12 ENCOUNTER — Encounter: Payer: Self-pay | Admitting: Nurse Practitioner

## 2021-12-12 VITALS — BP 130/81 | HR 89 | Temp 97.6°F | Resp 16 | Ht 71.0 in | Wt 208.2 lb

## 2021-12-12 DIAGNOSIS — R17 Unspecified jaundice: Secondary | ICD-10-CM | POA: Diagnosis not present

## 2021-12-12 DIAGNOSIS — R052 Subacute cough: Secondary | ICD-10-CM

## 2021-12-12 DIAGNOSIS — J189 Pneumonia, unspecified organism: Secondary | ICD-10-CM

## 2021-12-12 LAB — POCT URINALYSIS DIPSTICK
Bilirubin, UA: NEGATIVE
Blood, UA: NEGATIVE
Glucose, UA: NEGATIVE
Leukocytes, UA: NEGATIVE
Nitrite, UA: NEGATIVE
Protein, UA: NEGATIVE
Spec Grav, UA: 1.01 (ref 1.010–1.025)
Urobilinogen, UA: 0.2 E.U./dL
pH, UA: 7 (ref 5.0–8.0)

## 2021-12-12 MED ORDER — BENZONATATE 200 MG PO CAPS: 200.0000 mg | ORAL_CAPSULE | Freq: Two times a day (BID) | ORAL | 2 refills | Status: AC | PRN

## 2021-12-12 NOTE — Progress Notes (Signed)
[facility] [place], [place] [address]  Internal MEDICINE  Office Visit Note  Patient Name: Ricardo Durham  [id number]  [id number]  Date of Service: 12/12/2021  Chief Complaint  Patient presents with   Acute Visit    Possible pneumonia     HPI Ricardo Durham presents for an acute sick visit for complicated Pneumonia infection 2 round antibiotics and 2 round prednisone  Jaundice? Need labs to check kidney and liver function Lost 12lbs since September 28 Still feels fatigued and weak, tired, no energy.  Lingering cough    Current Medication:  Outpatient Encounter Medications as of 12/12/2021  Medication Sig   albuterol (PROVENTIL) (2.5 MG/3ML) 0.083% nebulizer solution Take 3 mLs (2.5 mg total) by nebulization every 6 (six) hours as needed for wheezing or shortness of breath.   aspirin 81 MG tablet Take 81 mg by mouth every other day.   atorvastatin (LIPITOR) 40 MG tablet Take 1 tablet (40 mg total) by mouth daily.   cetirizine (ZYRTEC) 10 MG tablet Take 10 mg by mouth daily.   Continuous Blood Gluc Sensor (FREESTYLE LIBRE 2 SENSOR) MISC Use as directed every 14  days DX E11.65   cyclobenzaprine (FLEXERIL) 10 MG tablet Take 1 tablet (10 mg total) by mouth 3 (three) times daily as needed for muscle spasms.   DULoxetine (CYMBALTA) 30 MG capsule TAKE ONE CAPSULE BY MOUTH ONCE EVERY DAY   fluticasone (VERAMYST) 27.5 MCG/SPRAY nasal spray Place 2 sprays into the nose daily.   Fluticasone-Salmeterol (ADVAIR) 100-50 MCG/DOSE AEPB Inhale 1 puff into the lungs 2 (two) times daily.   glucose blood (ACCU-CHEK GUIDE) test strip Use as instructed   insulin glargine (LANTUS) 100 UNIT/ML Solostar Pen Inject 35 Units into the skin daily.   Insulin Pen Needle 32G X 4 MM MISC Use as directed, once daily.   ipratropium (ATROVENT) 0.06 % nasal spray Place 2 sprays into both nostrils 4 (four) times daily.   JARDIANCE 10 MG TABS tablet Take 10 mg by mouth daily.   lisinopril  (ZESTRIL) 10 MG tablet Take 10 mg by mouth daily.   metFORMIN (GLUCOPHAGE) 1000 MG tablet Take 1 tablet (1,000 mg total) by mouth 2 (two) times daily with a meal.   ondansetron (ZOFRAN) 4 MG tablet Take 1 tablet (4 mg total) by mouth every 8 (eight) hours as needed for nausea or vomiting.   pantoprazole (PROTONIX) 40 MG tablet TAKE 1 TABLET BY MOUTH TWICE A DAY   [DISCONTINUED] amoxicillin-clavulanate (AUGMENTIN) 875-125 MG tablet Take 1 tablet by mouth 2 (two) times daily.   [DISCONTINUED] benzonatate (TESSALON) 200 MG capsule Take 1 capsule (200 mg total) by mouth 2 (two) times daily as needed for cough.   [DISCONTINUED] levofloxacin (LEVAQUIN) 750 MG tablet Take 1 tablet (750 mg total) by mouth daily.   [DISCONTINUED] predniSONE (DELTASONE) 10 MG tablet Take one tab 3 x day for 3 days, then take one tab 2 x a day for 3 days and then take one tab a day for 3 days for URI   benzonatate (TESSALON) 200 MG capsule Take 1 capsule (200 mg total) by mouth 2 (two) times daily as needed for cough.   No facility-administered encounter medications on file as of 12/12/2021.      Medical History: Past Medical History:  Diagnosis Date   COPD (chronic obstructive pulmonary disease) ([place])    Diabetes mellitus without complication ([facility])    Enlarged prostate    Hyperlipemia    Hypertension  Vital Signs: BP 130/81   Pulse 89   Temp 97.6 F (36.4 C)   Resp 16   Ht 5' 11" (1.803 m)   Wt 208 lb 3.2 oz (94.4 kg)   SpO2 94%   BMI 29.04 kg/m    Review of Systems  Constitutional:  Positive for appetite change, chills, fatigue and fever.  HENT:  Positive for congestion, sinus pressure, sinus pain, sore throat and voice change.   Respiratory:  Positive for cough, chest tightness, shortness of breath and wheezing.   Cardiovascular: Negative.  Negative for chest pain and palpitations.  Gastrointestinal:  Positive for diarrhea and nausea. Negative for constipation and vomiting.  Endocrine:  Positive for cold intolerance.  Musculoskeletal:  Positive for myalgias.  Neurological:  Positive for headaches.    Physical Exam Vitals reviewed.  Constitutional:      General: He is not in acute distress.    Appearance: He is ill-appearing.  HENT:     Head: Normocephalic and atraumatic.     Nose: Congestion present.     Mouth/Throat:     Mouth: Mucous membranes are moist.     Pharynx: Posterior oropharyngeal erythema present.  Eyes:     Pupils: Pupils are equal, round, and reactive to light.  Cardiovascular:     Rate and Rhythm: Normal rate and regular rhythm.     Heart sounds: Normal heart sounds. No murmur heard. Pulmonary:     Effort: Accessory muscle usage present. No respiratory distress.     Breath sounds: Examination of the right-upper field reveals wheezing and rales. Examination of the left-upper field reveals wheezing and rales. Examination of the right-middle field reveals rales. Examination of the left-middle field reveals rales. Examination of the right-lower field reveals decreased breath sounds. Examination of the left-lower field reveals decreased breath sounds. Decreased breath sounds, wheezing and rales present.  Neurological:     Mental Status: He is alert and oriented to person, place, and time.  Psychiatric:        Mood and Affect: Mood normal.        Behavior: Behavior normal.       Assessment/Plan: 1. Pneumonia of both lower lobes due to infectious organism CT chest and CBC ordered  - CT Chest Wo Contrast; Future - CBC with Differential/Platelet  2. Jaundice of recent onset Labs ordered - POCT Urinalysis Dipstick - CMP14+EGFR - CBC with Differential/Platelet - Bilirubin, fractionated (tot/dir/indir) - Gamma GT  3. Subacute cough Medication prescribed prn for cough - benzonatate (TESSALON) 200 MG capsule; Take 1 capsule (200 mg total) by mouth 2 (two) times daily as needed for cough.  Dispense: 30 capsule; Refill: 2   General Counseling:  Ricardo Durham verbalizes understanding of the findings of todays visit and agrees with plan of treatment. I have discussed any further diagnostic evaluation that may be needed or ordered today. We also reviewed his medications today. he has been encouraged to call the office with any questions or concerns that should arise related to todays visit.    Counseling:    Orders Placed This Encounter  Procedures   CT Chest Wo Contrast   CMP14+EGFR   CBC with Differential/Platelet   Bilirubin, fractionated (tot/dir/indir)   Gamma GT   POCT Urinalysis Dipstick    Meds ordered this encounter  Medications   benzonatate (TESSALON) 200 MG capsule    Sig: Take 1 capsule (200 mg total) by mouth 2 (two) times daily as needed for cough.    Dispense:  30 capsule  Refill:  2    Fill today    Return if symptoms worsen or fail to improve.  [place] Controlled Substance Database was reviewed by me for overdose risk score (ORS)  Time spent:30 Minutes Time spent with patient included reviewing progress notes, labs, imaging studies, and discussing plan for follow up.   This patient was seen by Jonetta Osgood, FNP-C in collaboration with Dr. Clayborn Bigness as a part of collaborative care agreement.  Shaneece Stockburger R. Valetta Fuller, MSN, FNP-C Internal Medicine

## 2021-12-13 ENCOUNTER — Telehealth: Payer: Self-pay | Admitting: Nurse Practitioner

## 2021-12-13 LAB — CMP14+EGFR
ALT: 16 IU/L (ref 0–44)
AST: 12 IU/L (ref 0–40)
Albumin/Globulin Ratio: 1.2 (ref 1.2–2.2)
Albumin: 3.9 g/dL (ref 3.9–4.9)
Alkaline Phosphatase: 95 IU/L (ref 44–121)
BUN/Creatinine Ratio: 15 (ref 10–24)
BUN: 12 mg/dL (ref 8–27)
Bilirubin Total: 0.3 mg/dL (ref 0.0–1.2)
CO2: 26 mmol/L (ref 20–29)
Calcium: 9.6 mg/dL (ref 8.6–10.2)
Chloride: 97 mmol/L (ref 96–106)
Creatinine, Ser: 0.82 mg/dL (ref 0.76–1.27)
Globulin, Total: 3.2 g/dL (ref 1.5–4.5)
Glucose: 109 mg/dL — ABNORMAL HIGH (ref 70–99)
Potassium: 5.5 mmol/L — ABNORMAL HIGH (ref 3.5–5.2)
Sodium: 138 mmol/L (ref 134–144)
Total Protein: 7.1 g/dL (ref 6.0–8.5)
eGFR: 97 mL/min/1.73 (ref >59)

## 2021-12-13 LAB — CBC WITH DIFFERENTIAL/PLATELET
Basophils Absolute: 0.1 x10E3/uL (ref 0.0–0.2)
Basos: 1 %
EOS (ABSOLUTE): 0.5 x10E3/uL — ABNORMAL HIGH (ref 0.0–0.4)
Eos: 4 %
Hematocrit: 39.7 % (ref 37.5–51.0)
Hemoglobin: 12.9 g/dL — ABNORMAL LOW (ref 13.0–17.7)
Immature Grans (Abs): 0.1 x10E3/uL (ref 0.0–0.1)
Immature Granulocytes: 1 %
Lymphocytes Absolute: 2.1 x10E3/uL (ref 0.7–3.1)
Lymphs: 17 %
MCH: 26.8 pg (ref 26.6–33.0)
MCHC: 32.5 g/dL (ref 31.5–35.7)
MCV: 83 fL (ref 79–97)
Monocytes Absolute: 1.2 x10E3/uL — ABNORMAL HIGH (ref 0.1–0.9)
Monocytes: 9 %
Neutrophils Absolute: 8.5 x10E3/uL — ABNORMAL HIGH (ref 1.4–7.0)
Neutrophils: 68 %
Platelets: 544 x10E3/uL — ABNORMAL HIGH (ref 150–450)
RBC: 4.81 x10E6/uL (ref 4.14–5.80)
RDW: 12.7 % (ref 11.6–15.4)
WBC: 12.4 x10E3/uL — ABNORMAL HIGH (ref 3.4–10.8)

## 2021-12-13 LAB — BILIRUBIN, FRACTIONATED(TOT/DIR/INDIR)
Bilirubin, Direct: 0.11 mg/dL (ref 0.00–0.40)
Bilirubin, Indirect: 0.19 mg/dL (ref 0.10–0.80)

## 2021-12-13 LAB — GAMMA GT: GGT: 30 IU/L (ref 0–65)

## 2021-12-13 NOTE — Telephone Encounter (Signed)
Patient sister called stating patient wanting to possibly have chest CT done @ [place]. I faxed office notes, lab results and imaging to [place] with the [place]; [phone]

## 2021-12-14 ENCOUNTER — Telehealth: Payer: Self-pay | Admitting: Nurse Practitioner

## 2021-12-14 ENCOUNTER — Encounter: Payer: Self-pay | Admitting: Nurse Practitioner

## 2021-12-14 DIAGNOSIS — R300 Dysuria: Secondary | ICD-10-CM

## 2021-12-14 DIAGNOSIS — J189 Pneumonia, unspecified organism: Secondary | ICD-10-CM

## 2021-12-14 NOTE — Telephone Encounter (Signed)
Patient's sister lvm on after-line stating patient coughing so hard, he thinks he may have fractured a rib. He is requesting more antibiotics and prednisone. Patient also sent mychart message to [facility]. She will handle his situation. I called sister back to let her know-Toni

## 2021-12-15 MED ORDER — PREDNISONE 10 MG PO TABS: ORAL_TABLET | ORAL | 0 refills | Status: DC

## 2021-12-16 MED ORDER — DOXYCYCLINE HYCLATE 100 MG PO TABS: 100.0000 mg | ORAL_TABLET | Freq: Two times a day (BID) | ORAL | 0 refills | Status: AC

## 2021-12-16 MED ORDER — PHENAZOPYRIDINE HCL 200 MG PO TABS: 200.0000 mg | ORAL_TABLET | Freq: Three times a day (TID) | ORAL | 0 refills | Status: DC | PRN

## 2021-12-16 MED ORDER — HYDROCOD POLI-CHLORPHE POLI ER 10-8 MG/5ML PO SUER: 5.0000 mL | Freq: Two times a day (BID) | ORAL | 0 refills | Status: DC | PRN

## 2021-12-16 NOTE — Telephone Encounter (Signed)
Called patient's sister to let him know to pick up his prescriptions and go to [facility] to give a urine specimen for culture and urinalysis.

## 2021-12-16 NOTE — Addendum Note (Signed)
Addended by: Jonetta Osgood on: 12/16/2021 08:09 AM   Modules accepted: Orders

## 2021-12-18 ENCOUNTER — Encounter: Payer: Self-pay | Admitting: Nurse Practitioner

## 2021-12-19 ENCOUNTER — Telehealth: Payer: Self-pay

## 2021-12-19 DIAGNOSIS — J189 Pneumonia, unspecified organism: Secondary | ICD-10-CM | POA: Diagnosis not present

## 2021-12-19 DIAGNOSIS — R590 Localized enlarged lymph nodes: Secondary | ICD-10-CM | POA: Diagnosis not present

## 2021-12-19 DIAGNOSIS — Z0389 Encounter for observation for other suspected diseases and conditions ruled out: Secondary | ICD-10-CM | POA: Diagnosis not present

## 2021-12-19 NOTE — Telephone Encounter (Signed)
Pt sister called that he is dehydrated as per alyssa advised her that he need to go to ED asap

## 2021-12-21 ENCOUNTER — Ambulatory Visit: Payer: BC Managed Care – PPO

## 2021-12-22 DIAGNOSIS — K219 Gastro-esophageal reflux disease without esophagitis: Secondary | ICD-10-CM | POA: Diagnosis not present

## 2021-12-22 DIAGNOSIS — J690 Pneumonitis due to inhalation of food and vomit: Secondary | ICD-10-CM | POA: Diagnosis not present

## 2021-12-22 DIAGNOSIS — E1142 Type 2 diabetes mellitus with diabetic polyneuropathy: Secondary | ICD-10-CM | POA: Diagnosis not present

## 2021-12-22 DIAGNOSIS — R61 Generalized hyperhidrosis: Secondary | ICD-10-CM | POA: Diagnosis not present

## 2021-12-22 DIAGNOSIS — E1165 Type 2 diabetes mellitus with hyperglycemia: Secondary | ICD-10-CM | POA: Diagnosis not present

## 2021-12-22 DIAGNOSIS — I2510 Atherosclerotic heart disease of native coronary artery without angina pectoris: Secondary | ICD-10-CM | POA: Diagnosis not present

## 2021-12-22 DIAGNOSIS — I10 Essential (primary) hypertension: Secondary | ICD-10-CM | POA: Diagnosis not present

## 2021-12-22 DIAGNOSIS — J441 Chronic obstructive pulmonary disease with (acute) exacerbation: Secondary | ICD-10-CM | POA: Diagnosis not present

## 2021-12-22 DIAGNOSIS — Z6827 Body mass index (BMI) 27.0-27.9, adult: Secondary | ICD-10-CM | POA: Diagnosis not present

## 2021-12-22 DIAGNOSIS — J159 Unspecified bacterial pneumonia: Secondary | ICD-10-CM | POA: Diagnosis not present

## 2021-12-22 DIAGNOSIS — Z87891 Personal history of nicotine dependence: Secondary | ICD-10-CM | POA: Diagnosis not present

## 2021-12-22 DIAGNOSIS — R634 Abnormal weight loss: Secondary | ICD-10-CM | POA: Diagnosis not present

## 2021-12-22 DIAGNOSIS — K2970 Gastritis, unspecified, without bleeding: Secondary | ICD-10-CM | POA: Diagnosis not present

## 2021-12-22 DIAGNOSIS — R1310 Dysphagia, unspecified: Secondary | ICD-10-CM | POA: Diagnosis not present

## 2021-12-22 DIAGNOSIS — N400 Enlarged prostate without lower urinary tract symptoms: Secondary | ICD-10-CM | POA: Diagnosis not present

## 2021-12-22 DIAGNOSIS — J449 Chronic obstructive pulmonary disease, unspecified: Secondary | ICD-10-CM | POA: Diagnosis not present

## 2021-12-22 DIAGNOSIS — Z794 Long term (current) use of insulin: Secondary | ICD-10-CM | POA: Diagnosis not present

## 2021-12-26 ENCOUNTER — Encounter: Payer: Self-pay | Admitting: Nurse Practitioner

## 2021-12-26 ENCOUNTER — Ambulatory Visit: Payer: BC Managed Care – PPO | Admitting: Nurse Practitioner

## 2021-12-26 VITALS — BP 122/73 | HR 108 | Temp 96.9°F | Resp 16 | Ht 71.0 in | Wt 200.8 lb

## 2021-12-26 DIAGNOSIS — J189 Pneumonia, unspecified organism: Secondary | ICD-10-CM | POA: Diagnosis not present

## 2021-12-26 DIAGNOSIS — I2510 Atherosclerotic heart disease of native coronary artery without angina pectoris: Secondary | ICD-10-CM

## 2021-12-26 MED ORDER — ALPRAZOLAM 0.5 MG PO TABS: 0.5000 mg | ORAL_TABLET | Freq: Two times a day (BID) | ORAL | 1 refills | Status: AC | PRN

## 2021-12-26 MED ORDER — HYDROCOD POLI-CHLORPHE POLI ER 10-8 MG/5ML PO SUER: 5.0000 mL | Freq: Two times a day (BID) | ORAL | 0 refills | Status: AC | PRN

## 2021-12-26 NOTE — Progress Notes (Signed)
[facility] [address], [address] [place] [id number] [facility] Discharge Acute Issues Care Follow Up                                                                        Patient Demographics  Ricardo Durham, is a 66 y.o. male  DOB 12-27-1955  MRN [id number].  Primary MD  Jonetta Osgood, NP   Reason for TCC follow Up - severe CAP, SOB, difficulty breathing   Past Medical History:  Diagnosis Date   COPD (chronic obstructive pulmonary disease) ([place])    Diabetes mellitus without complication ([place])    Enlarged prostate    Hyperlipemia    Hypertension     Past Surgical History:  Procedure Laterality Date   CARDIAC CATHETERIZATION Left 08/24/2015   Procedure: Left Heart Cath and Coronary Angiography;  Surgeon: Corey Skains, MD;  Location: [facility];  Service: Cardiovascular;  Laterality: Left;   COLONOSCOPY WITH PROPOFOL N/A 10/26/2014   Procedure: COLONOSCOPY WITH PROPOFOL;  Surgeon: Lollie Sails, MD;  Location: [facility] ENDOSCOPY;  Service: Endoscopy;  Laterality: N/A;   CYST REMOVAL TRUNK N/A    ESOPHAGOGASTRODUODENOSCOPY (EGD) WITH PROPOFOL N/A 10/26/2014   Procedure: ESOPHAGOGASTRODUODENOSCOPY (EGD) WITH PROPOFOL;  Surgeon: Lollie Sails, MD;  Location: [facility] ENDOSCOPY;  Service: Endoscopy;  Laterality: N/A;   NECK SURGERY     c 6-7 due to stable fracture   NISSEN FUNDOPLICATION     UMBILICAL HERNIA REPAIR  2001       Recent HPI and Hospital Course  --Diagnosed with community acquired pneumonia in both lungs. Went through 3 rounds of antibiotics. Chest xrays showed worsening pneumonia. Still having severe cough and difficulty breathing with worsening dyspnea at rest and with minimal exertion.  --on 12/19/21, he finally went to the [facility] and was admitted from 11/27 to 12/3. He was treated with IVF and IV antibiotics. CT angio  chest was done showing severe lingering pneumonia bilaterally with some scarring and a small pleural effusion.  --Incidentally three-vessel CAD was noted but he was not referred to cardiology for heart cath.  --At discharge, he will follow up with the [facility] clinic in [place], [place] for repeat CXR and a PFT.    [facility] Acute Care Issue to be followed in the Clinic   High risk for MI Pneumonia Complications of pneumonia COPD Three vessel CAD with calcifications, severity unknown.     Subjective:   Myrlene Broker today has, No headache, No chest pain, No abdominal pain - No Nausea, No new weakness tingling or numbness, no improvement in cough. Still gets SOB but slightly better.   Assessment & Plan    1. Pneumonia of both lower lobes due to infectious organism Continue cough suppressant medication, refill ordered.  - chlorpheniramine-HYDROcodone (TUSSIONEX) 10-8 MG/5ML; Take 5 mLs by mouth every 12 (twelve) hours as needed.  Dispense: 140 mL; Refill: 0  2. 3-vessel coronary artery disease Gave patient a copy of the  CT angio results with the concerning finding highlighted.  Strongly encouraged patient to ask his [facility] doctors for a referral to cardiology for possible heart cath. He is at increased risk of MI related to this problem     Reason for frequent admissions/ER visits    High risk for MI Pneumonia Complications of pneumonia COPD Three vessel CAD with calcifications, severity unknown.    Objective:   Vitals:   12/26/21 1541  BP: 122/73  Pulse: (!) 108  Resp: 16  Temp: (!) 96.9 F (36.1 C)  SpO2: 94%  Weight: 200 lb 12.8 oz (91.1 kg)  Height: '5\' 11"'$  (1.803 m)    Wt Readings from Last 3 Encounters:  12/26/21 200 lb 12.8 oz (91.1 kg)  12/12/21 208 lb 3.2 oz (94.4 kg)  11/15/21 215 lb (97.5 kg)    Allergies as of 12/26/2021       Reactions   Oxycodone Nausea Only        Medication List        Accurate as of December 26, 2021 11:59 PM. If you have  any questions, ask your nurse or doctor.          Accu-Chek Guide test strip Generic drug: glucose blood Use as instructed   albuterol (2.5 MG/3ML) 0.083% nebulizer solution Commonly known as: PROVENTIL Take 3 mLs (2.5 mg total) by nebulization every 6 (six) hours as needed for wheezing or shortness of breath.   ALPRAZolam 0.5 MG tablet Commonly known as: XANAX Take 1 tablet (0.5 mg total) by mouth 2 (two) times daily as needed for anxiety or sleep. Started by: Jonetta Osgood, NP   aspirin 81 MG tablet Take 81 mg by mouth every other day.   atorvastatin 40 MG tablet Commonly known as: LIPITOR Take 1 tablet (40 mg total) by mouth daily.   benzonatate 200 MG capsule Commonly known as: TESSALON Take 1 capsule (200 mg total) by mouth 2 (two) times daily as needed for cough.   cetirizine 10 MG tablet Commonly known as: ZYRTEC Take 10 mg by mouth daily.   chlorpheniramine-HYDROcodone 10-8 MG/5ML Commonly known as: TUSSIONEX Take 5 mLs by mouth every 12 (twelve) hours as needed.   cyclobenzaprine 10 MG tablet Commonly known as: FLEXERIL Take 1 tablet (10 mg total) by mouth 3 (three) times daily as needed for muscle spasms.   doxycycline 100 MG tablet Commonly known as: VIBRA-TABS Take 1 tablet (100 mg total) by mouth 2 (two) times daily for 14 days.   DULoxetine 30 MG capsule Commonly known as: CYMBALTA TAKE ONE CAPSULE BY MOUTH ONCE EVERY DAY   fluticasone 27.5 MCG/SPRAY nasal spray Commonly known as: VERAMYST Place 2 sprays into the nose daily.   Fluticasone-Salmeterol 100-50 MCG/DOSE Aepb Commonly known as: ADVAIR Inhale 1 puff into the lungs 2 (two) times daily.   FreeStyle Libre 2 Sensor Misc Use as directed every 14  days DX E11.65   insulin glargine 100 UNIT/ML Solostar Pen Commonly known as: LANTUS Inject 35 Units into the skin daily.   Insulin Pen Needle 32G X 4 MM Misc Use as directed, once daily.   ipratropium 0.06 % nasal spray Commonly  known as: ATROVENT Place 2 sprays into both nostrils 4 (four) times daily.   Jardiance 10 MG Tabs tablet Generic drug: empagliflozin Take 10 mg by mouth daily.   lisinopril 10 MG tablet Commonly known as: ZESTRIL Take 10 mg by mouth daily.   metFORMIN 1000 MG tablet Commonly known as: GLUCOPHAGE Take 1 tablet (  1,000 mg total) by mouth 2 (two) times daily with a meal.   ondansetron 4 MG tablet Commonly known as: ZOFRAN Take 1 tablet (4 mg total) by mouth every 8 (eight) hours as needed for nausea or vomiting.   pantoprazole 40 MG tablet Commonly known as: PROTONIX TAKE 1 TABLET BY MOUTH TWICE A DAY   phenazopyridine 200 MG tablet Commonly known as: Pyridium Take 1 tablet (200 mg total) by mouth 3 (three) times daily as needed for pain.   predniSONE 10 MG tablet Commonly known as: DELTASONE Take one tab 3 x day for 3 days, then take one tab 2 x a day for 3 days and then take one tab a day for 3 days for URI         Physical Exam: Constitutional: Patient appears well-developed and well-nourished. Not in obvious distress. HENT: Normocephalic, atraumatic, External right and left ear normal. Oropharynx is clear and moist.  Eyes: Conjunctivae and EOM are normal. PERRLA, no scleral icterus. Neck: Normal ROM. Neck supple. No JVD. No tracheal deviation. No thyromegaly. CVS: RRR, S1/S2 +, no murmurs, no gallops, no carotid bruit.  Pulmonary: Effort and breath sounds normal, no stridor, rhonchi, wheezes,. Has rales bilaterally  Abdominal: Soft. BS +, no distension, tenderness, rebound or guarding.  Musculoskeletal: Normal range of motion. No edema and no tenderness.  Lymphadenopathy: No lymphadenopathy noted, cervical, inguinal or axillary Neuro: Alert. Normal reflexes, muscle tone coordination. No cranial nerve deficit. Skin: Skin is warm and dry. No rash noted. Not diaphoretic. No erythema. No pallor. Psychiatric: Normal mood and affect. Behavior, judgment, thought content  normal.   Data Review   Micro Results No results found for this or any previous visit (from the past 240 hour(s)).   CBC No results for input(s): "WBC", "HGB", "HCT", "PLT", "MCV", "MCH", "MCHC", "RDW", "LYMPHSABS", "MONOABS", "EOSABS", "BASOSABS", "BANDABS" in the last 168 hours.  Invalid input(s): "NEUTRABS", "BANDSABD"  Chemistries  No results for input(s): "NA", "K", "CL", "CO2", "GLUCOSE", "BUN", "CREATININE", "CALCIUM", "MG", "AST", "ALT", "ALKPHOS", "BILITOT" in the last 168 hours.  Invalid input(s): "GFRCGP" ------------------------------------------------------------------------------------------------------------------ estimated creatinine clearance is 103.7 mL/min (by C-G formula based on SCr of 0.82 mg/dL). ------------------------------------------------------------------------------------------------------------------ No results for input(s): "HGBA1C" in the last 72 hours. ------------------------------------------------------------------------------------------------------------------ No results for input(s): "CHOL", "HDL", "LDLCALC", "TRIG", "CHOLHDL", "LDLDIRECT" in the last 72 hours. ------------------------------------------------------------------------------------------------------------------ No results for input(s): "TSH", "T4TOTAL", "T3FREE", "THYROIDAB" in the last 72 hours.  Invalid input(s): "FREET3" ------------------------------------------------------------------------------------------------------------------ No results for input(s): "VITAMINB12", "FOLATE", "FERRITIN", "TIBC", "IRON", "RETICCTPCT" in the last 72 hours.  Coagulation profile No results for input(s): "INR", "PROTIME" in the last 168 hours.  No results for input(s): "DDIMER" in the last 72 hours.  Cardiac Enzymes No results for input(s): "CKMB", "TROPONINI", "MYOGLOBIN" in the last 168 hours.  Invalid input(s):  "CK" ------------------------------------------------------------------------------------------------------------------ Invalid input(s): "POCBNP"  Return in about 1 month (around 01/26/2022) for F/U, Jock Mahon PCP.   Time Spent in minutes  45 Time spent with patient included reviewing progress notes, labs, imaging studies, and discussing plan for follow up.   [place] Controlled Substance Database was reviewed by me for overdose risk score (ORS)  This patient was seen by Jonetta Osgood, FNP-C in collaboration with Dr. Clayborn Bigness as a part of collaborative care agreement.    Jonetta Osgood MSN, FNP-C on 12/26/2021 at 4:20 PM   **Disclaimer: This note may have been dictated with voice recognition software. Similar sounding words can inadvertently be transcribed and this note may contain transcription errors which may not have  been corrected upon publication of note.**

## 2022-01-05 DIAGNOSIS — J189 Pneumonia, unspecified organism: Secondary | ICD-10-CM | POA: Diagnosis not present

## 2022-01-05 DIAGNOSIS — R590 Localized enlarged lymph nodes: Secondary | ICD-10-CM | POA: Diagnosis not present

## 2022-01-05 DIAGNOSIS — E119 Type 2 diabetes mellitus without complications: Secondary | ICD-10-CM | POA: Diagnosis not present

## 2022-01-05 DIAGNOSIS — J9811 Atelectasis: Secondary | ICD-10-CM | POA: Diagnosis not present

## 2022-01-05 DIAGNOSIS — Z7984 Long term (current) use of oral hypoglycemic drugs: Secondary | ICD-10-CM | POA: Diagnosis not present

## 2022-01-05 DIAGNOSIS — R61 Generalized hyperhidrosis: Secondary | ICD-10-CM | POA: Diagnosis not present

## 2022-01-05 DIAGNOSIS — R918 Other nonspecific abnormal finding of lung field: Secondary | ICD-10-CM | POA: Diagnosis not present

## 2022-01-05 DIAGNOSIS — J449 Chronic obstructive pulmonary disease, unspecified: Secondary | ICD-10-CM | POA: Diagnosis not present

## 2022-01-05 DIAGNOSIS — Z794 Long term (current) use of insulin: Secondary | ICD-10-CM | POA: Diagnosis not present

## 2022-01-19 ENCOUNTER — Ambulatory Visit: Payer: BC Managed Care – PPO | Admitting: Nurse Practitioner

## 2022-01-20 DIAGNOSIS — E119 Type 2 diabetes mellitus without complications: Secondary | ICD-10-CM | POA: Diagnosis not present

## 2022-01-20 DIAGNOSIS — G8929 Other chronic pain: Secondary | ICD-10-CM | POA: Diagnosis not present

## 2022-01-20 DIAGNOSIS — M25519 Pain in unspecified shoulder: Secondary | ICD-10-CM | POA: Diagnosis not present

## 2022-01-20 DIAGNOSIS — I2510 Atherosclerotic heart disease of native coronary artery without angina pectoris: Secondary | ICD-10-CM | POA: Diagnosis not present

## 2022-01-23 ENCOUNTER — Encounter: Payer: Self-pay | Admitting: Nurse Practitioner

## 2022-01-24 ENCOUNTER — Encounter: Payer: Self-pay | Admitting: Nurse Practitioner

## 2022-01-24 ENCOUNTER — Ambulatory Visit: Payer: BC Managed Care – PPO | Admitting: Nurse Practitioner

## 2022-01-24 VITALS — BP 115/69 | HR 98 | Temp 97.5°F | Resp 16 | Ht 71.0 in | Wt 205.0 lb

## 2022-01-24 DIAGNOSIS — G8929 Other chronic pain: Secondary | ICD-10-CM

## 2022-01-24 DIAGNOSIS — E1165 Type 2 diabetes mellitus with hyperglycemia: Secondary | ICD-10-CM | POA: Diagnosis not present

## 2022-01-24 DIAGNOSIS — J449 Chronic obstructive pulmonary disease, unspecified: Secondary | ICD-10-CM | POA: Diagnosis not present

## 2022-01-24 DIAGNOSIS — J189 Pneumonia, unspecified organism: Secondary | ICD-10-CM

## 2022-01-24 DIAGNOSIS — M25511 Pain in right shoulder: Secondary | ICD-10-CM

## 2022-01-24 DIAGNOSIS — I10 Essential (primary) hypertension: Secondary | ICD-10-CM

## 2022-01-24 DIAGNOSIS — Z8701 Personal history of pneumonia (recurrent): Secondary | ICD-10-CM

## 2022-01-24 LAB — POCT GLYCOSYLATED HEMOGLOBIN (HGB A1C): Hemoglobin A1C: 11 % — AB (ref 4.0–5.6)

## 2022-01-24 MED ORDER — TIRZEPATIDE 5 MG/0.5ML ~~LOC~~ SOAJ: 5.0000 mg | SUBCUTANEOUS | 2 refills | Status: DC

## 2022-01-24 MED ORDER — HYDROCODONE-ACETAMINOPHEN 5-325 MG PO TABS: 1.0000 | ORAL_TABLET | ORAL | 0 refills | Status: AC | PRN

## 2022-01-24 NOTE — Progress Notes (Unsigned)
[facility] [place], [place] [address]  Internal MEDICINE  Office Visit Note  Patient Name: Ricardo Durham  [id number]  [id number]  Date of Service: 01/24/2022  Chief Complaint  Patient presents with   Follow-up   Depression   Hyperlipidemia   Hypertension    HPI Echo presents for a follow-up visit for  Diabetes -- A1c 11.0 COPD -- chronic cough, and SOB interfering with daily activities  Weight loss -- gained 5 lbs  Need to talk to [employer].  Right shoulder pain, still c/o pain and decreased ROM    Current Medication: Outpatient Encounter Medications as of 01/24/2022  Medication Sig   albuterol (PROVENTIL) (2.5 MG/3ML) 0.083% nebulizer solution Take 3 mLs (2.5 mg total) by nebulization every 6 (six) hours as needed for wheezing or shortness of breath.   ALPRAZolam (XANAX) 0.5 MG tablet Take 1 tablet (0.5 mg total) by mouth 2 (two) times daily as needed for anxiety or sleep.   aspirin 81 MG tablet Take 81 mg by mouth every other day.   atorvastatin (LIPITOR) 40 MG tablet Take 1 tablet (40 mg total) by mouth daily.   benzonatate (TESSALON) 200 MG capsule Take 1 capsule (200 mg total) by mouth 2 (two) times daily as needed for cough.   cetirizine (ZYRTEC) 10 MG tablet Take 10 mg by mouth daily.   chlorpheniramine-HYDROcodone (TUSSIONEX) 10-8 MG/5ML Take 5 mLs by mouth every 12 (twelve) hours as needed.   Continuous Blood Gluc Sensor (FREESTYLE LIBRE 2 SENSOR) MISC Use as directed every 14  days DX E11.65   cyclobenzaprine (FLEXERIL) 10 MG tablet Take 1 tablet (10 mg total) by mouth 3 (three) times daily as needed for muscle spasms.   DULoxetine (CYMBALTA) 30 MG capsule TAKE ONE CAPSULE BY MOUTH ONCE EVERY DAY   fluticasone (VERAMYST) 27.5 MCG/SPRAY nasal spray Place 2 sprays into the nose daily.   Fluticasone-Salmeterol (ADVAIR) 100-50 MCG/DOSE AEPB Inhale 1 puff into the lungs 2 (two) times daily.   glucose blood (ACCU-CHEK GUIDE) test strip Use as  instructed   insulin glargine (LANTUS) 100 UNIT/ML Solostar Pen Inject 35 Units into the skin daily.   Insulin Pen Needle 32G X 4 MM MISC Use as directed, once daily.   ipratropium (ATROVENT) 0.06 % nasal spray Place 2 sprays into both nostrils 4 (four) times daily.   JARDIANCE 10 MG TABS tablet Take 10 mg by mouth daily.   lisinopril (ZESTRIL) 10 MG tablet Take 10 mg by mouth daily.   metFORMIN (GLUCOPHAGE) 1000 MG tablet Take 1 tablet (1,000 mg total) by mouth 2 (two) times daily with a meal.   ondansetron (ZOFRAN) 4 MG tablet Take 1 tablet (4 mg total) by mouth every 8 (eight) hours as needed for nausea or vomiting.   pantoprazole (PROTONIX) 40 MG tablet TAKE 1 TABLET BY MOUTH TWICE A DAY   phenazopyridine (PYRIDIUM) 200 MG tablet Take 1 tablet (200 mg total) by mouth 3 (three) times daily as needed for pain.   predniSONE (DELTASONE) 10 MG tablet Take one tab 3 x day for 3 days, then take one tab 2 x a day for 3 days and then take one tab a day for 3 days for URI   tirzepatide (MOUNJARO) 5 MG/0.5ML Pen Inject 5 mg into the skin once a week.   HYDROcodone-acetaminophen (NORCO/VICODIN) 5-325 MG tablet Take 1 tablet by mouth every 4 (four) hours as needed for up to 5 days for moderate pain or severe pain.  No facility-administered encounter medications on file as of 01/24/2022.    Surgical History: Past Surgical History:  Procedure Laterality Date   CARDIAC CATHETERIZATION Left 08/24/2015   Procedure: Left Heart Cath and Coronary Angiography;  Surgeon: Corey Skains, MD;  Location: [facility];  Service: Cardiovascular;  Laterality: Left;   COLONOSCOPY WITH PROPOFOL N/A 10/26/2014   Procedure: COLONOSCOPY WITH PROPOFOL;  Surgeon: Lollie Sails, MD;  Location: [facility] ENDOSCOPY;  Service: Endoscopy;  Laterality: N/A;   CYST REMOVAL TRUNK N/A    ESOPHAGOGASTRODUODENOSCOPY (EGD) WITH PROPOFOL N/A 10/26/2014   Procedure: ESOPHAGOGASTRODUODENOSCOPY (EGD) WITH PROPOFOL;  Surgeon: Lollie Sails, MD;  Location: [facility] ENDOSCOPY;  Service: Endoscopy;  Laterality: N/A;   NECK SURGERY     c 6-7 due to stable fracture   NISSEN FUNDOPLICATION     UMBILICAL HERNIA REPAIR  2001    Medical History: Past Medical History:  Diagnosis Date   COPD (chronic obstructive pulmonary disease) ([place])    Diabetes mellitus without complication ([place])    Enlarged prostate    Hyperlipemia    Hypertension     Family History: Family History  Problem Relation Age of Onset   Diabetes Father    Alzheimer's disease Mother    Hypertension Mother    Hyperlipidemia Mother     Social History   Socioeconomic History   Marital status: Divorced    Spouse name: Not on file   Number of children: Not on file   Years of education: Not on file   Highest education level: Not on file  Occupational History   Not on file  Tobacco Use   Smoking status: Former    Types: Cigarettes    Quit date: 01/22/1980    Years since quitting: 42.0   Smokeless tobacco: Never  Vaping Use   Vaping Use: Never used  Substance and Sexual Activity   Alcohol use: Not Currently    Alcohol/week: 1.0 standard drink of alcohol    Types: 1 Cans of beer per week    Comment: occasionally   Drug use: No   Sexual activity: Not on file  Other Topics Concern   Not on file  Social History Narrative   Not on file   Social Determinants of Health   Financial Resource Strain: Not on file  Food Insecurity: Not on file  Transportation Needs: Not on file  Physical Activity: Not on file  Stress: Not on file  Social Connections: Not on file  Intimate Partner Violence: Not on file      Review of Systems  Vital Signs: BP 115/69   Pulse 98   Temp (!) 97.5 F (36.4 C)   Resp 16   Ht '5\' 11"'$  (1.803 m)   Wt 205 lb (93 kg)   SpO2 94%   BMI 28.59 kg/m    Physical Exam     Assessment/Plan:   General Counseling: Mouhamadou verbalizes understanding of the findings of todays visit and agrees with plan of treatment. I  have discussed any further diagnostic evaluation that may be needed or ordered today. We also reviewed his medications today. he has been encouraged to call the office with any questions or concerns that should arise related to todays visit.    Orders Placed This Encounter  Procedures   POCT glycosylated hemoglobin (Hb A1C)    Meds ordered this encounter  Medications   tirzepatide (MOUNJARO) 5 MG/0.5ML Pen    Sig: Inject 5 mg into the skin once a week.  Dispense:  2 mL    Refill:  2    Dx code E11.65   HYDROcodone-acetaminophen (NORCO/VICODIN) 5-325 MG tablet    Sig: Take 1 tablet by mouth every 4 (four) hours as needed for up to 5 days for moderate pain or severe pain.    Dispense:  30 tablet    Refill:  0    Return in about 1 month (around 02/24/2022) for F/U, Devinne Epstein PCP right shoulder, diabetes and new med eval.   Total time spent:*** Minutes Time spent includes review of chart, medications, test results, and follow up plan with the patient.   [place] Controlled Substance Database was reviewed by me.  This patient was seen by Jonetta Osgood, FNP-C in collaboration with Dr. Clayborn Bigness as a part of collaborative care agreement.   Dorean Daniello R. Valetta Fuller, MSN, FNP-C Internal medicine

## 2022-01-25 ENCOUNTER — Ambulatory Visit: Payer: BC Managed Care – PPO | Admitting: Nurse Practitioner

## 2022-01-25 ENCOUNTER — Encounter: Payer: Self-pay | Admitting: Nurse Practitioner

## 2022-01-26 DIAGNOSIS — E119 Type 2 diabetes mellitus without complications: Secondary | ICD-10-CM | POA: Diagnosis not present

## 2022-02-02 ENCOUNTER — Encounter: Payer: Self-pay | Admitting: Nurse Practitioner

## 2022-02-16 DIAGNOSIS — R918 Other nonspecific abnormal finding of lung field: Secondary | ICD-10-CM | POA: Diagnosis not present

## 2022-02-16 DIAGNOSIS — J449 Chronic obstructive pulmonary disease, unspecified: Secondary | ICD-10-CM | POA: Diagnosis not present

## 2022-02-16 DIAGNOSIS — Z87891 Personal history of nicotine dependence: Secondary | ICD-10-CM | POA: Diagnosis not present

## 2022-02-16 DIAGNOSIS — I2510 Atherosclerotic heart disease of native coronary artery without angina pectoris: Secondary | ICD-10-CM | POA: Diagnosis not present

## 2022-02-16 DIAGNOSIS — J189 Pneumonia, unspecified organism: Secondary | ICD-10-CM | POA: Diagnosis not present

## 2022-02-16 DIAGNOSIS — R590 Localized enlarged lymph nodes: Secondary | ICD-10-CM | POA: Diagnosis not present

## 2022-02-22 ENCOUNTER — Telehealth: Payer: Self-pay

## 2022-02-22 NOTE — Telephone Encounter (Signed)
Faxed jardiance pres to [employer] from [facility] [phone]

## 2022-02-24 ENCOUNTER — Telehealth: Payer: Self-pay | Admitting: Nurse Practitioner

## 2022-02-24 NOTE — Telephone Encounter (Signed)
Received vm on after-hour line yesterday evening from Angie with [employer] [facility] requesting rx for Jardiance. Per cma earlier note, rx had been faxed. I faxed again; [phone]-Toni

## 2022-03-06 ENCOUNTER — Encounter: Payer: Self-pay | Admitting: Nurse Practitioner

## 2022-03-06 ENCOUNTER — Ambulatory Visit: Payer: BC Managed Care – PPO | Admitting: Nurse Practitioner

## 2022-03-06 VITALS — BP 109/68 | HR 94 | Temp 97.5°F | Resp 16 | Ht 71.0 in | Wt 213.2 lb

## 2022-03-06 DIAGNOSIS — G8929 Other chronic pain: Secondary | ICD-10-CM

## 2022-03-06 DIAGNOSIS — J0110 Acute frontal sinusitis, unspecified: Secondary | ICD-10-CM | POA: Diagnosis not present

## 2022-03-06 DIAGNOSIS — I10 Essential (primary) hypertension: Secondary | ICD-10-CM | POA: Diagnosis not present

## 2022-03-06 DIAGNOSIS — M25511 Pain in right shoulder: Secondary | ICD-10-CM | POA: Diagnosis not present

## 2022-03-06 DIAGNOSIS — E1165 Type 2 diabetes mellitus with hyperglycemia: Secondary | ICD-10-CM | POA: Diagnosis not present

## 2022-03-06 MED ORDER — INSULIN GLARGINE 100 UNIT/ML SOLOSTAR PEN: 55.0000 [IU] | PEN_INJECTOR | Freq: Every day | SUBCUTANEOUS | 8 refills | Status: AC

## 2022-03-06 MED ORDER — EMPAGLIFLOZIN 25 MG PO TABS: 25.0000 mg | ORAL_TABLET | Freq: Every day | ORAL | Status: AC

## 2022-03-06 MED ORDER — AZITHROMYCIN 250 MG PO TABS: ORAL_TABLET | ORAL | 0 refills | Status: AC

## 2022-03-06 NOTE — Progress Notes (Signed)
[facility] [place], [place] [address]  Internal MEDICINE  Office Visit Note  Patient Name: Ricardo Durham  [id number]  [id number]  Date of Service: 03/06/2022  Chief Complaint  Patient presents with   Follow-up   Hyperlipidemia   Hypertension   Diabetes    HPI Ricardo Durham presents for a follow-up visit for diabetes and hypertension. For diabetes--On insulin glargine 45 units daily, Ozempic 0.5 mg weekly, Insulin aspart 5 units with meals. Fasting glucose is 275-300. [facility] not [facility], have on ozempic  For hypertension -- Not on lisinopril  Chronic right shoulder pain -- Right shoulder still bothering him with radiating pain and numbness down right arm.  Residual symptoms of sinusitis - requests additional zpak.    Current Medication: Outpatient Encounter Medications as of 03/06/2022  Medication Sig   albuterol (PROVENTIL) (2.5 MG/3ML) 0.083% nebulizer solution Take 3 mLs (2.5 mg total) by nebulization every 6 (six) hours as needed for wheezing or shortness of breath.   ALPRAZolam (XANAX) 0.5 MG tablet Take 1 tablet (0.5 mg total) by mouth 2 (two) times daily as needed for anxiety or sleep.   aspirin 81 MG tablet Take 81 mg by mouth every other day.   atorvastatin (LIPITOR) 40 MG tablet Take 1 tablet (40 mg total) by mouth daily.   azithromycin (ZITHROMAX) 250 MG tablet Take 2 tablets on day 1, then 1 tablet daily on days 2 through 5   benzonatate (TESSALON) 200 MG capsule Take 1 capsule (200 mg total) by mouth 2 (two) times daily as needed for cough.   cetirizine (ZYRTEC) 10 MG tablet Take 10 mg by mouth daily.   chlorpheniramine-HYDROcodone (TUSSIONEX) 10-8 MG/5ML Take 5 mLs by mouth every 12 (twelve) hours as needed.   Continuous Blood Gluc Sensor (FREESTYLE LIBRE 2 SENSOR) MISC Use as directed every 14  days DX E11.65   cyclobenzaprine (FLEXERIL) 10 MG tablet Take 1 tablet (10 mg total) by mouth 3 (three) times daily as needed for muscle spasms.    DULoxetine (CYMBALTA) 30 MG capsule TAKE ONE CAPSULE BY MOUTH ONCE EVERY DAY   empagliflozin (JARDIANCE) 25 MG TABS tablet Take 1 tablet (25 mg total) by mouth daily.   fluticasone (VERAMYST) 27.5 MCG/SPRAY nasal spray Place 2 sprays into the nose daily.   Fluticasone-Salmeterol (ADVAIR) 100-50 MCG/DOSE AEPB Inhale 1 puff into the lungs 2 (two) times daily.   glucose blood (ACCU-CHEK GUIDE) test strip Use as instructed   Insulin Pen Needle 32G X 4 MM MISC Use as directed, once daily.   ipratropium (ATROVENT) 0.06 % nasal spray Place 2 sprays into both nostrils 4 (four) times daily.   metFORMIN (GLUCOPHAGE) 1000 MG tablet Take 1 tablet (1,000 mg total) by mouth 2 (two) times daily with a meal.   ondansetron (ZOFRAN) 4 MG tablet Take 1 tablet (4 mg total) by mouth every 8 (eight) hours as needed for nausea or vomiting.   pantoprazole (PROTONIX) 40 MG tablet TAKE 1 TABLET BY MOUTH TWICE A DAY   [DISCONTINUED] insulin glargine (LANTUS) 100 UNIT/ML Solostar Pen Inject 35 Units into the skin daily.   [DISCONTINUED] JARDIANCE 10 MG TABS tablet Take 10 mg by mouth daily.   [DISCONTINUED] lisinopril (ZESTRIL) 10 MG tablet Take 10 mg by mouth daily.   [DISCONTINUED] phenazopyridine (PYRIDIUM) 200 MG tablet Take 1 tablet (200 mg total) by mouth 3 (three) times daily as needed for pain.   [DISCONTINUED] predniSONE (DELTASONE) 10 MG tablet Take one tab 3 x day  for 3 days, then take one tab 2 x a day for 3 days and then take one tab a day for 3 days for URI   [DISCONTINUED] tirzepatide (MOUNJARO) 5 MG/0.5ML Pen Inject 5 mg into the skin once a week.   insulin glargine (LANTUS) 100 UNIT/ML Solostar Pen Inject 55 Units into the skin daily.   No facility-administered encounter medications on file as of 03/06/2022.    Surgical History: Past Surgical History:  Procedure Laterality Date   CARDIAC CATHETERIZATION Left 08/24/2015   Procedure: Left Heart Cath and Coronary Angiography;  Surgeon: Corey Skains,  MD;  Location: [facility];  Service: Cardiovascular;  Laterality: Left;   COLONOSCOPY WITH PROPOFOL N/A 10/26/2014   Procedure: COLONOSCOPY WITH PROPOFOL;  Surgeon: Lollie Sails, MD;  Location: [facility] ENDOSCOPY;  Service: Endoscopy;  Laterality: N/A;   CYST REMOVAL TRUNK N/A    ESOPHAGOGASTRODUODENOSCOPY (EGD) WITH PROPOFOL N/A 10/26/2014   Procedure: ESOPHAGOGASTRODUODENOSCOPY (EGD) WITH PROPOFOL;  Surgeon: Lollie Sails, MD;  Location: [facility] ENDOSCOPY;  Service: Endoscopy;  Laterality: N/A;   NECK SURGERY     c 6-7 due to stable fracture   NISSEN FUNDOPLICATION     UMBILICAL HERNIA REPAIR  2001    Medical History: Past Medical History:  Diagnosis Date   COPD (chronic obstructive pulmonary disease) ([place])    Diabetes mellitus without complication ([place])    Enlarged prostate    Hyperlipemia    Hypertension     Family History: Family History  Problem Relation Age of Onset   Diabetes Father    Alzheimer's disease Mother    Hypertension Mother    Hyperlipidemia Mother     Social History   Socioeconomic History   Marital status: Divorced    Spouse name: Not on file   Number of children: Not on file   Years of education: Not on file   Highest education level: Not on file  Occupational History   Not on file  Tobacco Use   Smoking status: Former    Types: Cigarettes    Quit date: 01/22/1980    Years since quitting: 42.1   Smokeless tobacco: Never  Vaping Use   Vaping Use: Never used  Substance and Sexual Activity   Alcohol use: Not Currently    Alcohol/week: 1.0 standard drink of alcohol    Types: 1 Cans of beer per week    Comment: occasionally   Drug use: No   Sexual activity: Not on file  Other Topics Concern   Not on file  Social History Narrative   Not on file   Social Determinants of Health   Financial Resource Strain: Not on file  Food Insecurity: Not on file  Transportation Needs: Not on file  Physical Activity: Not on file  Stress: Not  on file  Social Connections: Not on file  Intimate Partner Violence: Not on file      Review of Systems  Constitutional:  Positive for appetite change and fatigue. Negative for chills and fever.  HENT:  Positive for congestion, postnasal drip, rhinorrhea, sinus pressure and sinus pain. Negative for sore throat and voice change.   Respiratory:  Negative for cough, chest tightness, shortness of breath and wheezing.   Cardiovascular: Negative.  Negative for chest pain and palpitations.  Gastrointestinal:  Negative for constipation, diarrhea, nausea and vomiting.  Endocrine: Positive for cold intolerance.  Musculoskeletal:  Positive for arthralgias. Negative for myalgias.  Neurological:  Positive for weakness and headaches.    Vital  Signs: BP 109/68   Pulse 94   Temp (!) 97.5 F (36.4 C)   Resp 16   Ht 5' 11"$  (1.803 m)   Wt 213 lb 3.2 oz (96.7 kg)   SpO2 96%   BMI 29.74 kg/m    Physical Exam Vitals reviewed.  Constitutional:      General: He is not in acute distress.    Appearance: Normal appearance. He is not ill-appearing.  HENT:     Head: Normocephalic and atraumatic.     Nose: No congestion.     Mouth/Throat:     Mouth: Mucous membranes are moist.     Pharynx: No posterior oropharyngeal erythema.  Eyes:     Pupils: Pupils are equal, round, and reactive to light.  Cardiovascular:     Rate and Rhythm: Normal rate and regular rhythm.     Heart sounds: Normal heart sounds. No murmur heard. Pulmonary:     Effort: No accessory muscle usage or respiratory distress.     Breath sounds: Normal breath sounds and air entry. No wheezing or rales.  Musculoskeletal:     Right shoulder: Decreased range of motion. Decreased strength.  Neurological:     Mental Status: He is alert and oriented to person, place, and time.  Psychiatric:        Mood and Affect: Mood normal.        Behavior: Behavior normal.        Assessment/Plan: 1. Uncontrolled type 2 diabetes mellitus  with hyperglycemia (HCC) Increase insulin glargine dose to 55 units daily. Continue all other diabetic medications as prescribed. Continue jardiance 25 mg daiyl as prescribed. - insulin glargine (LANTUS) 100 UNIT/ML Solostar Pen; Inject 55 Units into the skin daily.  Dispense: 15 mL; Refill: 8 - empagliflozin (JARDIANCE) 25 MG TABS tablet; Take 1 tablet (25 mg total) by mouth daily.  2. Essential hypertension Stable, not taking lisinopril right now. No other changes.   3. Chronic right shoulder pain Wants to have surgery but A1c is too high right now.   4. Acute non-recurrent frontal sinusitis 5 day zpack prescribed.  - azithromycin (ZITHROMAX) 250 MG tablet; Take 2 tablets on day 1, then 1 tablet daily on days 2 through 5  Dispense: 6 tablet; Refill: 0   General Counseling: Masaki verbalizes understanding of the findings of todays visit and agrees with plan of treatment. I have discussed any further diagnostic evaluation that may be needed or ordered today. We also reviewed his medications today. he has been encouraged to call the office with any questions or concerns that should arise related to todays visit.    No orders of the defined types were placed in this encounter.   Meds ordered this encounter  Medications   insulin glargine (LANTUS) 100 UNIT/ML Solostar Pen    Sig: Inject 55 Units into the skin daily.    Dispense:  15 mL    Refill:  8   empagliflozin (JARDIANCE) 25 MG TABS tablet    Sig: Take 1 tablet (25 mg total) by mouth daily.   azithromycin (ZITHROMAX) 250 MG tablet    Sig: Take 2 tablets on day 1, then 1 tablet daily on days 2 through 5    Dispense:  6 tablet    Refill:  0    Return in about 1 month (around 04/04/2022) for F/U, Cathyrn Deas PCP med review BRING ALL MEDS TO APPT.   Total time spent:30 Minutes Time spent includes review of chart, medications, test results, and follow  up plan with the patient.   [place] Controlled Substance Database was reviewed by  me.  This patient was seen by Jonetta Osgood, FNP-C in collaboration with Dr. Clayborn Bigness as a part of collaborative care agreement.   Sarahanne Novakowski R. Valetta Fuller, MSN, FNP-C Internal medicine

## 2022-03-16 DIAGNOSIS — I2510 Atherosclerotic heart disease of native coronary artery without angina pectoris: Secondary | ICD-10-CM | POA: Diagnosis not present

## 2022-03-16 DIAGNOSIS — R0602 Shortness of breath: Secondary | ICD-10-CM | POA: Diagnosis not present

## 2022-03-16 DIAGNOSIS — Z136 Encounter for screening for cardiovascular disorders: Secondary | ICD-10-CM | POA: Diagnosis not present

## 2022-03-16 DIAGNOSIS — R0789 Other chest pain: Secondary | ICD-10-CM | POA: Diagnosis not present

## 2022-03-16 DIAGNOSIS — I951 Orthostatic hypotension: Secondary | ICD-10-CM | POA: Diagnosis not present

## 2022-03-16 DIAGNOSIS — I209 Angina pectoris, unspecified: Secondary | ICD-10-CM | POA: Diagnosis not present

## 2022-03-16 DIAGNOSIS — E119 Type 2 diabetes mellitus without complications: Secondary | ICD-10-CM | POA: Diagnosis not present

## 2022-03-16 DIAGNOSIS — H9202 Otalgia, left ear: Secondary | ICD-10-CM | POA: Diagnosis not present

## 2022-03-22 DIAGNOSIS — R42 Dizziness and giddiness: Secondary | ICD-10-CM | POA: Diagnosis not present

## 2022-03-22 DIAGNOSIS — H9319 Tinnitus, unspecified ear: Secondary | ICD-10-CM | POA: Diagnosis not present

## 2022-03-22 DIAGNOSIS — E785 Hyperlipidemia, unspecified: Secondary | ICD-10-CM | POA: Diagnosis not present

## 2022-03-22 DIAGNOSIS — E119 Type 2 diabetes mellitus without complications: Secondary | ICD-10-CM | POA: Diagnosis not present

## 2022-03-23 DIAGNOSIS — E1165 Type 2 diabetes mellitus with hyperglycemia: Secondary | ICD-10-CM | POA: Diagnosis not present

## 2022-03-23 DIAGNOSIS — E785 Hyperlipidemia, unspecified: Secondary | ICD-10-CM | POA: Diagnosis not present

## 2022-03-23 DIAGNOSIS — I2510 Atherosclerotic heart disease of native coronary artery without angina pectoris: Secondary | ICD-10-CM | POA: Diagnosis not present

## 2022-03-23 DIAGNOSIS — I10 Essential (primary) hypertension: Secondary | ICD-10-CM | POA: Diagnosis not present

## 2022-03-23 DIAGNOSIS — Z7689 Persons encountering health services in other specified circumstances: Secondary | ICD-10-CM | POA: Diagnosis not present

## 2022-03-25 IMAGING — CR DG CHEST 2V
2 series · 2 of 2 positions shown · non-contrast
Comparison: 01/22/2020

CLINICAL DATA: Cough for 2 weeks. Chest congestion for 2 months.
COPD.

EXAM:
CHEST - 2 VIEW

[chest pa]
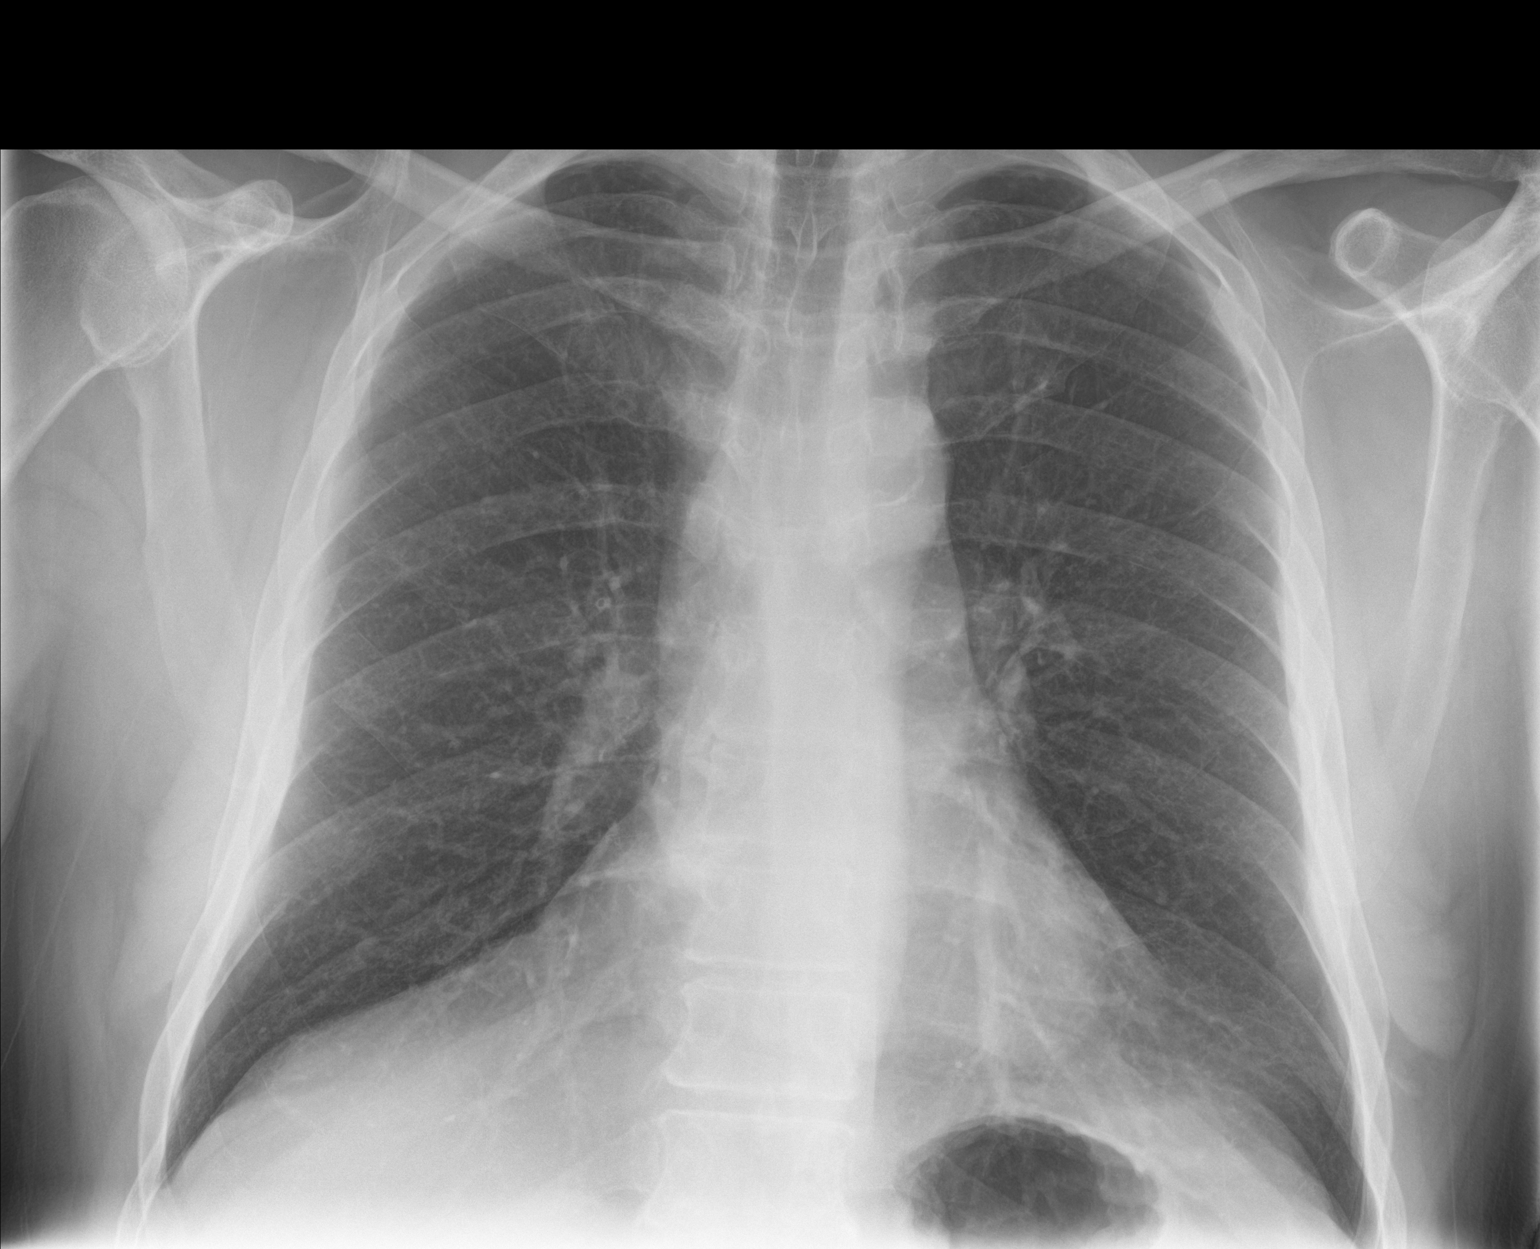

[chest lat]
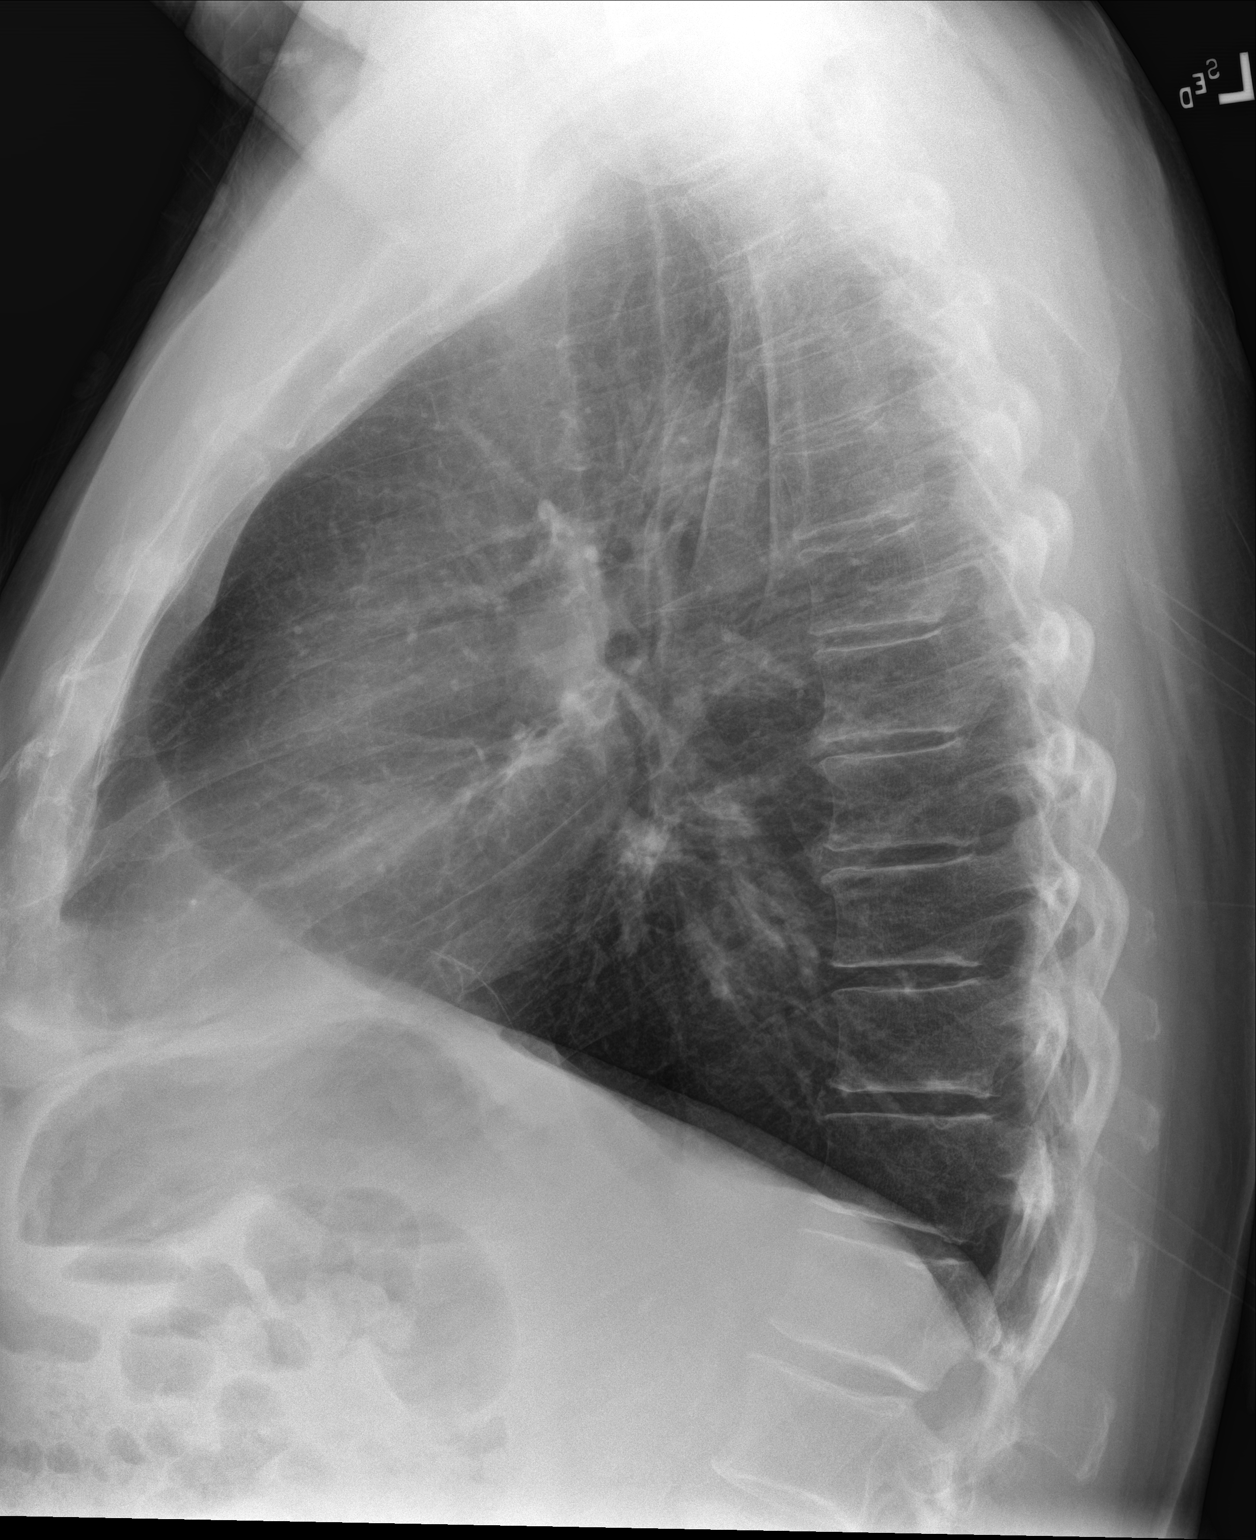

[2 of 2 positions shown; findings below may reference images not displayed]

FINDINGS: The heart size and mediastinal contours are within normal limits.
Aortic atherosclerotic calcification noted. Pulmonary hyperinflation
is again seen, consistent with COPD. Both lungs are clear. Cervical
spine fusion hardware again noted.
IMPRESSION: COPD. No active cardiopulmonary disease.

## 2022-03-30 DIAGNOSIS — I2510 Atherosclerotic heart disease of native coronary artery without angina pectoris: Secondary | ICD-10-CM | POA: Diagnosis not present

## 2022-04-03 ENCOUNTER — Ambulatory Visit: Payer: BC Managed Care – PPO | Admitting: Nurse Practitioner

## 2022-04-03 ENCOUNTER — Telehealth: Payer: Self-pay | Admitting: Nurse Practitioner

## 2022-04-03 ENCOUNTER — Encounter: Payer: Self-pay | Admitting: Nurse Practitioner

## 2022-04-03 VITALS — BP 127/81 | HR 100 | Temp 96.1°F | Resp 16 | Ht 71.0 in | Wt 214.2 lb

## 2022-04-03 DIAGNOSIS — E1165 Type 2 diabetes mellitus with hyperglycemia: Secondary | ICD-10-CM

## 2022-04-03 DIAGNOSIS — G8929 Other chronic pain: Secondary | ICD-10-CM

## 2022-04-03 DIAGNOSIS — M25511 Pain in right shoulder: Secondary | ICD-10-CM

## 2022-04-03 DIAGNOSIS — I10 Essential (primary) hypertension: Secondary | ICD-10-CM

## 2022-04-03 DIAGNOSIS — H9313 Tinnitus, bilateral: Secondary | ICD-10-CM | POA: Diagnosis not present

## 2022-04-03 LAB — POCT GLYCOSYLATED HEMOGLOBIN (HGB A1C): Hemoglobin A1C: 8.2 % — AB (ref 4.0–5.6)

## 2022-04-03 NOTE — Progress Notes (Signed)
[facility] [place], [place] [address]  Internal MEDICINE  Office Visit Note  Patient Name: Ricardo Durham  [id number]  [id number]  Date of Service: 04/03/2022  Chief Complaint  Patient presents with   Diabetes   Follow-up    HPI Jeanclaude presents [facility]ed [facility].  Diabetes -- A1c is 8.2 today, significantly improved. Resent 10 mg jardiance prescription.  Dizziness and ringing in his ears --Has ringing in his ears, and leaking from left ear -- getting brain scans done by the [place]. Had an echo done recently -- looked good per [facility] cardiology.  Right [facility] pain, [facility].     Current Medication: Outpatient Encounter Medications as of 04/03/2022  Medication Sig   albuterol (PROVENTIL) (2.5 MG/3ML) 0.083% nebulizer solution Take 3 mLs (2.5 mg total) by nebulization every 6 (six) hours as needed for wheezing or shortness of breath.   ALPRAZolam (XANAX) 0.5 MG tablet Take 1 tablet (0.5 mg total) by mouth 2 (two) times daily as needed for anxiety or sleep.   aspirin 81 MG tablet Take 81 mg by mouth every other day.   atorvastatin (LIPITOR) 40 MG tablet Take 1 tablet (40 mg total) by mouth daily.   benzonatate (TESSALON) 200 MG capsule Take 1 capsule (200 mg total) by mouth 2 (two) times daily as needed for cough.   cetirizine (ZYRTEC) 10 MG tablet Take 10 mg by mouth daily.   chlorpheniramine-HYDROcodone (TUSSIONEX) 10-8 MG/5ML Take 5 mLs by mouth every 12 (twelve) hours as needed.   Continuous Blood Gluc Sensor (FREESTYLE LIBRE 2 SENSOR) MISC Use as directed every 14  days DX E11.65   cyclobenzaprine (FLEXERIL) 10 MG tablet Take 1 tablet (10 mg total) by mouth 3 (three) times daily as needed for muscle spasms.   DULoxetine (CYMBALTA) 30 MG capsule TAKE ONE CAPSULE BY MOUTH ONCE EVERY DAY   empagliflozin (JARDIANCE) 25 MG TABS tablet Take 1 tablet (25 mg total) by mouth daily.    fluticasone (VERAMYST) 27.5 MCG/SPRAY nasal spray Place 2 sprays into the nose daily.   Fluticasone-Salmeterol (ADVAIR) 100-50 MCG/DOSE AEPB Inhale 1 puff into the lungs 2 (two) times daily.   glucose blood (ACCU-CHEK GUIDE) test strip Use as instructed   insulin glargine (LANTUS) 100 UNIT/ML Solostar Pen Inject 55 Units into the skin daily.   Insulin Pen Needle 32G X 4 MM MISC Use as directed, once daily.   ipratropium (ATROVENT) 0.06 % nasal spray Place 2 sprays into both nostrils 4 (four) times daily.   metFORMIN (GLUCOPHAGE) 1000 MG tablet Take 1 tablet (1,000 mg total) by mouth 2 (two) times daily with a meal.   ondansetron (ZOFRAN) 4 MG tablet Take 1 tablet (4 mg total) by mouth every 8 (eight) hours as needed for nausea or vomiting.   pantoprazole (PROTONIX) 40 MG tablet TAKE 1 TABLET BY MOUTH TWICE A DAY   No facility-administered encounter medications on file as of 04/03/2022.    Surgical History: Past Surgical History:  Procedure Laterality Date   CARDIAC CATHETERIZATION Left 08/24/2015   Procedure: Left Heart Cath and Coronary Angiography;  Surgeon: Corey Skains, MD;  Location: [facility];  Service: Cardiovascular;  Laterality: Left;   COLONOSCOPY WITH PROPOFOL N/A 10/26/2014   Procedure: COLONOSCOPY WITH PROPOFOL;  Surgeon: Lollie Sails, MD;  Location[facility] ENDOSCOPY;  Service: Endoscopy;  Laterality: N/A;   CYST REMOVAL TRUNK N/A  ESOPHAGOGASTRODUODENOSCOPY (EGD) WITH PROPOFOL N/A 10/26/2014   Procedure: ESOPHAGOGASTRODUODENOSCOPY (EGD) WITH PROPOFOL;  Surgeon: Lollie Sails, MD;  Location: [facility] ENDOSCOPY;  Service: Endoscopy;  Laterality: N/A;   NECK SURGERY     c 6-7 due to stable fracture   NISSEN FUNDOPLICATION     UMBILICAL HERNIA REPAIR  2001    Medical History: Past Medical History:  Diagnosis Date   COPD (chronic obstructive pulmonary disease) ([place])    Diabetes mellitus without complication ([place])    Enlarged prostate    Hyperlipemia     Hypertension     Family History: Family History  Problem Relation Age of Onset   Diabetes Father    Alzheimer's disease Mother    Hypertension Mother    Hyperlipidemia Mother     Social History   Socioeconomic History   Marital status: Divorced    Spouse name: Not on file   Number of children: Not on file   Years of education: Not on file   Highest education level: Not on file  Occupational History   Not on file  Tobacco Use   Smoking status: Former    Types: Cigarettes    Quit date: 01/22/1980    Years since quitting: 42.2   Smokeless tobacco: Never  Vaping Use   Vaping Use: Never used  Substance and Sexual Activity   Alcohol use: Not Currently    Alcohol/week: 1.0 standard drink of alcohol    Types: 1 Cans of beer per week    Comment: occasionally   Drug use: No   Sexual activity: Not on file  Other Topics Concern   Not on file  Social History Narrative   Not on file   Social Determinants of Health   Financial Resource Strain: Not on file  Food Insecurity: Not on file  Transportation Needs: Not on file  Physical Activity: Not on file  Stress: Not on file  Social Connections: Not on file  Intimate Partner Violence: Not on file      Review of Systems  Constitutional:  Positive for fatigue. Negative for appetite change, chills and fever.  HENT:  Positive for tinnitus. Negative for congestion, postnasal drip, rhinorrhea, sinus pressure, sinus pain, sore throat and voice change.   Respiratory: Negative.  Negative for cough, chest tightness, shortness of breath and wheezing.   Cardiovascular: Negative.  Negative for chest pain and palpitations.  Gastrointestinal:  Negative for constipation, diarrhea, nausea and vomiting.  Endocrine: Negative for cold intolerance.  Musculoskeletal:  Positive for arthralgias. Negative for myalgias.  Neurological:  Positive for dizziness. Negative for weakness and headaches.    Vital Signs: BP 127/81   Pulse 100   Temp  (!) 96.1 F (35.6 C)   Resp 16   Ht '5\' 11"'$  (1.803 m)   Wt 214 lb 3.2 oz (97.2 kg)   SpO2 96%   BMI 29.87 kg/m    Physical Exam Vitals reviewed.  Constitutional:      General: He is not in acute distress.    Appearance: Normal appearance. He is obese. He is not ill-appearing.  HENT:     Head: Normocephalic and atraumatic.  Eyes:     Pupils: Pupils are equal, round, and reactive to light.  Cardiovascular:     Rate and Rhythm: Normal rate and regular rhythm.  Pulmonary:     Effort: Pulmonary effort is normal. No respiratory distress.  Neurological:     Mental Status: He is alert and oriented to person, place, and time.  Psychiatric:        Mood and Affect: Mood normal.        Behavior: Behavior normal.        Assessment/Plan: 1. Uncontrolled type 2 diabetes mellitus with hyperglycemia (HCC) A1c improving to 8.2. taking jardiance 10 mg daily, and ozempic per [place]. Was highly recommended to be started on mounjaro but the [place] told the patient no.  His average glucose readings have been improving.  - POCT glycosylated hemoglobin (Hb A1C)  2. Essential hypertension BP stable with current medications, continue as prescribed.   3. Chronic right shoulder pain Right shoulder is continuing to bother him, he is hoping that the [place] will be able to do surgery soon now that his A1c is improving.   4. Ringing in ear, bilateral Having brain scans done by the [place].    General Counseling: seab rhame understanding of the findings of todays visit and agrees with plan of treatment. I have discussed any further diagnostic evaluation that may be needed or ordered today. We also reviewed his medications today. he has been encouraged to call the office with any questions or concerns that should arise related to todays visit.    Orders Placed This Encounter  Procedures   POCT glycosylated hemoglobin (Hb A1C)    No orders of the defined types were placed in this encounter.   Return  in about 4 months (around 08/03/2022) for F/U, Recheck A1C, Merica Prell PCP.   Total time spent:30 Minutes Time spent includes review of chart, medications, test results, and follow up plan with the patient.   [place] Controlled Substance Database was reviewed by me.  This patient was seen by Jonetta Osgood, FNP-C in collaboration with Dr. Clayborn Bigness as a part of collaborative care agreement.   Chasiti Waddington R. Valetta Fuller, MSN, FNP-C Internal medicine

## 2022-04-03 NOTE — Telephone Encounter (Signed)
Jardiance RX faxed to [employer]; [phone]. Scanned-Toni

## 2022-04-05 DIAGNOSIS — R0602 Shortness of breath: Secondary | ICD-10-CM | POA: Diagnosis not present

## 2022-04-05 DIAGNOSIS — E119 Type 2 diabetes mellitus without complications: Secondary | ICD-10-CM | POA: Diagnosis not present

## 2022-04-07 ENCOUNTER — Telehealth: Payer: Self-pay | Admitting: Nurse Practitioner

## 2022-04-07 NOTE — Telephone Encounter (Signed)
01/23/22-Present MR faxed to [facility] for disability; [phone]

## 2022-04-12 DIAGNOSIS — Z0189 Encounter for other specified special examinations: Secondary | ICD-10-CM | POA: Diagnosis not present

## 2022-04-12 DIAGNOSIS — E119 Type 2 diabetes mellitus without complications: Secondary | ICD-10-CM | POA: Diagnosis not present

## 2022-04-12 DIAGNOSIS — H9319 Tinnitus, unspecified ear: Secondary | ICD-10-CM | POA: Diagnosis not present

## 2022-04-12 DIAGNOSIS — Z23 Encounter for immunization: Secondary | ICD-10-CM | POA: Diagnosis not present

## 2022-04-12 DIAGNOSIS — R42 Dizziness and giddiness: Secondary | ICD-10-CM | POA: Diagnosis not present

## 2022-04-15 DIAGNOSIS — E119 Type 2 diabetes mellitus without complications: Secondary | ICD-10-CM | POA: Diagnosis not present

## 2022-04-15 DIAGNOSIS — H903 Sensorineural hearing loss, bilateral: Secondary | ICD-10-CM | POA: Diagnosis not present

## 2022-04-15 DIAGNOSIS — N400 Enlarged prostate without lower urinary tract symptoms: Secondary | ICD-10-CM | POA: Diagnosis not present

## 2022-04-15 DIAGNOSIS — J449 Chronic obstructive pulmonary disease, unspecified: Secondary | ICD-10-CM | POA: Diagnosis not present

## 2022-04-15 DIAGNOSIS — H9319 Tinnitus, unspecified ear: Secondary | ICD-10-CM | POA: Diagnosis not present

## 2022-04-15 DIAGNOSIS — I2510 Atherosclerotic heart disease of native coronary artery without angina pectoris: Secondary | ICD-10-CM | POA: Diagnosis not present

## 2022-04-19 DIAGNOSIS — H6092 Unspecified otitis externa, left ear: Secondary | ICD-10-CM | POA: Diagnosis not present

## 2022-04-19 DIAGNOSIS — R42 Dizziness and giddiness: Secondary | ICD-10-CM | POA: Diagnosis not present

## 2022-04-19 DIAGNOSIS — H9313 Tinnitus, bilateral: Secondary | ICD-10-CM | POA: Diagnosis not present

## 2022-04-24 DIAGNOSIS — I2510 Atherosclerotic heart disease of native coronary artery without angina pectoris: Secondary | ICD-10-CM | POA: Diagnosis not present

## 2022-04-24 DIAGNOSIS — M542 Cervicalgia: Secondary | ICD-10-CM | POA: Diagnosis not present

## 2022-04-24 DIAGNOSIS — W19XXXA Unspecified fall, initial encounter: Secondary | ICD-10-CM | POA: Diagnosis not present

## 2022-04-24 DIAGNOSIS — Z981 Arthrodesis status: Secondary | ICD-10-CM | POA: Diagnosis not present

## 2022-04-24 DIAGNOSIS — M25511 Pain in right shoulder: Secondary | ICD-10-CM | POA: Diagnosis not present

## 2022-04-24 DIAGNOSIS — M47812 Spondylosis without myelopathy or radiculopathy, cervical region: Secondary | ICD-10-CM | POA: Diagnosis not present

## 2022-04-24 DIAGNOSIS — E119 Type 2 diabetes mellitus without complications: Secondary | ICD-10-CM | POA: Diagnosis not present

## 2022-04-28 DIAGNOSIS — E785 Hyperlipidemia, unspecified: Secondary | ICD-10-CM | POA: Diagnosis not present

## 2022-04-28 DIAGNOSIS — I2510 Atherosclerotic heart disease of native coronary artery without angina pectoris: Secondary | ICD-10-CM | POA: Diagnosis not present

## 2022-04-28 DIAGNOSIS — E1165 Type 2 diabetes mellitus with hyperglycemia: Secondary | ICD-10-CM | POA: Diagnosis not present

## 2022-04-28 DIAGNOSIS — I10 Essential (primary) hypertension: Secondary | ICD-10-CM | POA: Diagnosis not present

## 2022-05-06 DIAGNOSIS — Z461 Encounter for fitting and adjustment of hearing aid: Secondary | ICD-10-CM | POA: Diagnosis not present

## 2022-05-06 DIAGNOSIS — H9313 Tinnitus, bilateral: Secondary | ICD-10-CM | POA: Diagnosis not present

## 2022-05-09 DIAGNOSIS — J069 Acute upper respiratory infection, unspecified: Secondary | ICD-10-CM | POA: Diagnosis not present

## 2022-05-09 DIAGNOSIS — R918 Other nonspecific abnormal finding of lung field: Secondary | ICD-10-CM | POA: Diagnosis not present

## 2022-05-09 DIAGNOSIS — Z0389 Encounter for observation for other suspected diseases and conditions ruled out: Secondary | ICD-10-CM | POA: Diagnosis not present

## 2022-05-09 DIAGNOSIS — R0789 Other chest pain: Secondary | ICD-10-CM | POA: Diagnosis not present

## 2022-05-10 DIAGNOSIS — R918 Other nonspecific abnormal finding of lung field: Secondary | ICD-10-CM | POA: Diagnosis not present

## 2022-05-10 DIAGNOSIS — Z0389 Encounter for observation for other suspected diseases and conditions ruled out: Secondary | ICD-10-CM | POA: Diagnosis not present

## 2022-05-10 DIAGNOSIS — R590 Localized enlarged lymph nodes: Secondary | ICD-10-CM | POA: Diagnosis not present

## 2022-05-10 DIAGNOSIS — J069 Acute upper respiratory infection, unspecified: Secondary | ICD-10-CM | POA: Diagnosis not present

## 2022-05-10 DIAGNOSIS — J189 Pneumonia, unspecified organism: Secondary | ICD-10-CM | POA: Diagnosis not present

## 2022-05-10 DIAGNOSIS — R079 Chest pain, unspecified: Secondary | ICD-10-CM | POA: Diagnosis not present

## 2022-05-14 DIAGNOSIS — M65811 Other synovitis and tenosynovitis, right shoulder: Secondary | ICD-10-CM | POA: Diagnosis not present

## 2022-05-14 DIAGNOSIS — M19011 Primary osteoarthritis, right shoulder: Secondary | ICD-10-CM | POA: Diagnosis not present

## 2022-05-14 DIAGNOSIS — M7551 Bursitis of right shoulder: Secondary | ICD-10-CM | POA: Diagnosis not present

## 2022-05-15 DIAGNOSIS — H9313 Tinnitus, bilateral: Secondary | ICD-10-CM | POA: Diagnosis not present

## 2022-05-15 DIAGNOSIS — H6123 Impacted cerumen, bilateral: Secondary | ICD-10-CM | POA: Diagnosis not present

## 2022-05-17 DIAGNOSIS — M25511 Pain in right shoulder: Secondary | ICD-10-CM | POA: Diagnosis not present

## 2022-05-17 DIAGNOSIS — R0600 Dyspnea, unspecified: Secondary | ICD-10-CM | POA: Diagnosis not present

## 2022-05-17 DIAGNOSIS — M7541 Impingement syndrome of right shoulder: Secondary | ICD-10-CM | POA: Diagnosis not present

## 2022-05-17 DIAGNOSIS — R42 Dizziness and giddiness: Secondary | ICD-10-CM | POA: Diagnosis not present

## 2022-05-17 DIAGNOSIS — R918 Other nonspecific abnormal finding of lung field: Secondary | ICD-10-CM | POA: Diagnosis not present

## 2022-05-17 DIAGNOSIS — R079 Chest pain, unspecified: Secondary | ICD-10-CM | POA: Diagnosis not present

## 2022-05-17 DIAGNOSIS — R61 Generalized hyperhidrosis: Secondary | ICD-10-CM | POA: Diagnosis not present

## 2022-05-17 DIAGNOSIS — Z8701 Personal history of pneumonia (recurrent): Secondary | ICD-10-CM | POA: Diagnosis not present

## 2022-05-19 DIAGNOSIS — R1314 Dysphagia, pharyngoesophageal phase: Secondary | ICD-10-CM | POA: Diagnosis not present

## 2022-05-19 DIAGNOSIS — R1312 Dysphagia, oropharyngeal phase: Secondary | ICD-10-CM | POA: Diagnosis not present

## 2022-05-22 DIAGNOSIS — R490 Dysphonia: Secondary | ICD-10-CM | POA: Diagnosis not present

## 2022-05-22 DIAGNOSIS — R590 Localized enlarged lymph nodes: Secondary | ICD-10-CM | POA: Diagnosis not present

## 2022-05-22 DIAGNOSIS — R079 Chest pain, unspecified: Secondary | ICD-10-CM | POA: Diagnosis not present

## 2022-05-22 DIAGNOSIS — J189 Pneumonia, unspecified organism: Secondary | ICD-10-CM | POA: Diagnosis not present

## 2022-05-24 DIAGNOSIS — M25511 Pain in right shoulder: Secondary | ICD-10-CM | POA: Diagnosis not present

## 2022-05-25 DIAGNOSIS — R1313 Dysphagia, pharyngeal phase: Secondary | ICD-10-CM | POA: Diagnosis not present

## 2022-05-29 DIAGNOSIS — K449 Diaphragmatic hernia without obstruction or gangrene: Secondary | ICD-10-CM | POA: Diagnosis not present

## 2022-05-29 DIAGNOSIS — K224 Dyskinesia of esophagus: Secondary | ICD-10-CM | POA: Diagnosis not present

## 2022-06-01 DIAGNOSIS — J449 Chronic obstructive pulmonary disease, unspecified: Secondary | ICD-10-CM | POA: Diagnosis not present

## 2022-06-01 DIAGNOSIS — J189 Pneumonia, unspecified organism: Secondary | ICD-10-CM | POA: Diagnosis not present

## 2022-06-01 DIAGNOSIS — E119 Type 2 diabetes mellitus without complications: Secondary | ICD-10-CM | POA: Diagnosis not present

## 2022-06-01 DIAGNOSIS — I10 Essential (primary) hypertension: Secondary | ICD-10-CM | POA: Diagnosis not present

## 2022-06-01 DIAGNOSIS — I2510 Atherosclerotic heart disease of native coronary artery without angina pectoris: Secondary | ICD-10-CM | POA: Diagnosis not present

## 2022-06-01 DIAGNOSIS — K219 Gastro-esophageal reflux disease without esophagitis: Secondary | ICD-10-CM | POA: Diagnosis not present

## 2022-06-01 DIAGNOSIS — Z794 Long term (current) use of insulin: Secondary | ICD-10-CM | POA: Diagnosis not present

## 2022-06-22 DIAGNOSIS — J189 Pneumonia, unspecified organism: Secondary | ICD-10-CM | POA: Diagnosis not present

## 2022-06-22 DIAGNOSIS — E119 Type 2 diabetes mellitus without complications: Secondary | ICD-10-CM | POA: Diagnosis not present

## 2022-06-22 DIAGNOSIS — J449 Chronic obstructive pulmonary disease, unspecified: Secondary | ICD-10-CM | POA: Diagnosis not present

## 2022-08-03 ENCOUNTER — Ambulatory Visit: Payer: BC Managed Care – PPO | Admitting: Nurse Practitioner

## 2022-10-19 ENCOUNTER — Telehealth: Payer: Self-pay | Admitting: Nurse Practitioner

## 2022-10-19 NOTE — Telephone Encounter (Signed)
Received [employer] long term disability. Gave to AA to review-nm

## 2022-10-23 ENCOUNTER — Telehealth: Payer: Self-pay | Admitting: Nurse Practitioner

## 2022-10-23 NOTE — Telephone Encounter (Signed)
Faxed 04/24/22 to present MR to [place]; [phone]

## 2022-10-25 ENCOUNTER — Encounter: Payer: Self-pay | Admitting: Nurse Practitioner

## 2022-10-26 ENCOUNTER — Telehealth: Payer: Self-pay | Admitting: Nurse Practitioner

## 2022-10-26 ENCOUNTER — Encounter: Payer: BC Managed Care – PPO | Admitting: Nurse Practitioner

## 2022-10-26 NOTE — Telephone Encounter (Signed)
Patient will be going to [place] for appointments. Self discharged-Ricardo Durham
